# Patient Record
Sex: Female | Born: 1985 | Race: Black or African American | Hispanic: No | Marital: Single | Smoking: Former smoker
Health system: Southern US, Community
[De-identification: ages and names within clinical notes are randomized; demographics above are authoritative.]

## PROBLEM LIST (undated history)

## (undated) ENCOUNTER — Inpatient Hospital Stay (HOSPITAL_COMMUNITY): Payer: Self-pay

## (undated) DIAGNOSIS — Z3009 Encounter for other general counseling and advice on contraception: Secondary | ICD-10-CM

## (undated) DIAGNOSIS — R87629 Unspecified abnormal cytological findings in specimens from vagina: Secondary | ICD-10-CM

## (undated) DIAGNOSIS — I10 Essential (primary) hypertension: Secondary | ICD-10-CM

## (undated) DIAGNOSIS — K5090 Crohn's disease, unspecified, without complications: Secondary | ICD-10-CM

## (undated) DIAGNOSIS — Z975 Presence of (intrauterine) contraceptive device: Secondary | ICD-10-CM

## (undated) DIAGNOSIS — IMO0002 Reserved for concepts with insufficient information to code with codable children: Secondary | ICD-10-CM

## (undated) DIAGNOSIS — O09211 Supervision of pregnancy with history of pre-term labor, first trimester: Secondary | ICD-10-CM

## (undated) DIAGNOSIS — R87619 Unspecified abnormal cytological findings in specimens from cervix uteri: Secondary | ICD-10-CM

## (undated) HISTORY — DX: Encounter for other general counseling and advice on contraception: Z30.09

## (undated) HISTORY — DX: Supervision of pregnancy with history of pre-term labor, first trimester: O09.211

## (undated) HISTORY — DX: Unspecified abnormal cytological findings in specimens from vagina: R87.629

## (undated) HISTORY — PX: DILATION AND CURETTAGE OF UTERUS: SHX78

## (undated) HISTORY — DX: Essential (primary) hypertension: I10

## (undated) HISTORY — PX: SMALL INTESTINE SURGERY: SHX150

## (undated) HISTORY — DX: Presence of (intrauterine) contraceptive device: Z97.5

---
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
## 2007-01-09 ENCOUNTER — Ambulatory Visit: Payer: Self-pay | Admitting: Oncology

## 2007-01-20 ENCOUNTER — Ambulatory Visit (HOSPITAL_COMMUNITY): Admission: RE | Admit: 2007-01-20 | Discharge: 2007-01-20 | Payer: Self-pay | Admitting: Obstetrics & Gynecology

## 2007-01-30 ENCOUNTER — Inpatient Hospital Stay (HOSPITAL_COMMUNITY): Admission: AD | Admit: 2007-01-30 | Discharge: 2007-01-30 | Payer: Self-pay | Admitting: Obstetrics & Gynecology

## 2007-01-30 ENCOUNTER — Ambulatory Visit: Payer: Self-pay | Admitting: Family Medicine

## 2007-03-11 ENCOUNTER — Ambulatory Visit (HOSPITAL_COMMUNITY): Admission: RE | Admit: 2007-03-11 | Discharge: 2007-03-11 | Payer: Self-pay | Admitting: Obstetrics and Gynecology

## 2007-04-21 ENCOUNTER — Inpatient Hospital Stay (HOSPITAL_COMMUNITY): Admission: AD | Admit: 2007-04-21 | Discharge: 2007-04-23 | Payer: Self-pay | Admitting: Obstetrics & Gynecology

## 2007-04-21 ENCOUNTER — Ambulatory Visit: Payer: Self-pay | Admitting: Obstetrics & Gynecology

## 2007-04-24 ENCOUNTER — Inpatient Hospital Stay (HOSPITAL_COMMUNITY): Admission: AD | Admit: 2007-04-24 | Discharge: 2007-05-05 | Payer: Self-pay | Admitting: Gynecology

## 2007-04-24 ENCOUNTER — Ambulatory Visit: Payer: Self-pay | Admitting: Obstetrics and Gynecology

## 2007-04-27 ENCOUNTER — Encounter: Payer: Self-pay | Admitting: Obstetrics & Gynecology

## 2007-05-02 ENCOUNTER — Encounter: Payer: Self-pay | Admitting: General Surgery

## 2007-11-11 ENCOUNTER — Emergency Department (HOSPITAL_COMMUNITY): Admission: EM | Admit: 2007-11-11 | Discharge: 2007-11-11 | Payer: Self-pay | Admitting: Family Medicine

## 2008-05-19 ENCOUNTER — Emergency Department (HOSPITAL_COMMUNITY): Admission: EM | Admit: 2008-05-19 | Discharge: 2008-05-19 | Payer: Self-pay | Admitting: Emergency Medicine

## 2008-06-16 ENCOUNTER — Encounter: Admission: RE | Admit: 2008-06-16 | Discharge: 2008-06-16 | Payer: Self-pay | Admitting: Gastroenterology

## 2008-06-20 ENCOUNTER — Encounter (INDEPENDENT_AMBULATORY_CARE_PROVIDER_SITE_OTHER): Payer: Self-pay | Admitting: General Surgery

## 2008-06-20 ENCOUNTER — Inpatient Hospital Stay (HOSPITAL_COMMUNITY): Admission: RE | Admit: 2008-06-20 | Discharge: 2008-06-27 | Payer: Self-pay | Admitting: General Surgery

## 2008-09-21 ENCOUNTER — Other Ambulatory Visit: Admission: RE | Admit: 2008-09-21 | Discharge: 2008-09-21 | Payer: Self-pay | Admitting: Obstetrics and Gynecology

## 2010-03-19 ENCOUNTER — Inpatient Hospital Stay (HOSPITAL_COMMUNITY): Admission: AD | Admit: 2010-03-19 | Discharge: 2010-03-19 | Payer: Self-pay | Admitting: Obstetrics & Gynecology

## 2010-04-05 ENCOUNTER — Ambulatory Visit: Payer: Self-pay | Admitting: Physician Assistant

## 2010-04-05 ENCOUNTER — Inpatient Hospital Stay (HOSPITAL_COMMUNITY): Admission: AD | Admit: 2010-04-05 | Discharge: 2010-04-05 | Payer: Self-pay | Admitting: Obstetrics and Gynecology

## 2011-02-19 LAB — URINALYSIS, ROUTINE W REFLEX MICROSCOPIC
Bilirubin Urine: NEGATIVE
Glucose, UA: NEGATIVE mg/dL
Ketones, ur: NEGATIVE mg/dL
Nitrite: NEGATIVE
Nitrite: NEGATIVE
Protein, ur: NEGATIVE mg/dL
Protein, ur: NEGATIVE mg/dL
Specific Gravity, Urine: 1.01 (ref 1.005–1.030)
Urobilinogen, UA: 0.2 mg/dL (ref 0.0–1.0)

## 2011-02-19 LAB — WET PREP, GENITAL
Clue Cells Wet Prep HPF POC: NONE SEEN
Trich, Wet Prep: NONE SEEN
Yeast Wet Prep HPF POC: NONE SEEN

## 2011-02-19 LAB — URINE MICROSCOPIC-ADD ON

## 2011-02-19 LAB — URINE CULTURE

## 2011-04-16 NOTE — Consult Note (Signed)
NAME:  Lauren Ramirez, Lauren Ramirez NO.:  [id number]   MEDICAL RECORD NO.:  [id number]          PATIENT TYPE:  [place]   LOCATION:                                FACILITY:  [facility]   PHYSICIAN:  Adin Hector, MD     DATE OF BIRTH:  23-Sep-1986   DATE OF CONSULTATION:  DATE OF DISCHARGE:  04/23/2007                                 CONSULTATION   PRIMARY CARE PHYSICIAN:  None.   REASON FOR CONSULTATION:  Small bowel obstruction with a family history  of Crohn's.   HISTORY OF THE PRESENT ILLNESS:  The patient is a 25 year old female who  is otherwise pretty healthy with no prior surgeries, history of hernias  or any other health issues, or bowel problems.  She is three days status  post spontaneous vaginal delivery of an uncomplicated pregnancy and  delivery.  Postoperatively she developed abdominal distention, nausea  and crampy abdominal pain.  She has been obstipated with no flatus or  bowel movements.  They gave her enema and this seemed to open things up.  She claims she is tolerating some fluids, but she does admit that she is  getting crampy intermittent abdominal pain and obvious loud gurgling  noises and worsening distention.  Based on concerns Dr. Kalman Shan got  radiological films including KUB and CT scan, which were very concerning  for very dilated loops of small bowel.  The patient's mother and brother  were diagnosed to have Crohn's in her early 37s.  She was initially  treated with medicines, but actually had to have surgery.  The patient  is concerned and she is asked to be consulted based on radiological  recommendations for surgical evaluation given the severity of her bowel  obstruction.   She denies any sick contacts or travel history.  She normally has been  able to eat very easily with regular daily bowel movements, and no bouts  of constipation or diarrhea in the past.   PAST MEDICAL HISTORY:  The past medical history is otherwise pretty  negative.   PAST  SURGICAL HISTORY:  The past surgical history is negative.   PAST GYNECOLOGICAL HISTORY:  The patient is status post spontaneous  vaginal delivery; post delivery day number three and uncomplicated.   MEDICATIONS:  Medications at home include iron and prenatal vitamins,  and Flintstone's Chewables.   ALLERGIES:  None.   SOCIAL HISTORY:  The patient has occasionally has alcohol. She  occasionally has had cannabis in the past, but no tobacco or other drug  use.   FAMILY HISTORY:  The patient's mother and brother have Crohn's disease.  There is no other history of any bowel of GI or bowel problems.   REVIEW OF SYSTEMS:  The review of systems is as per the HPI otherwise  CONSTITUTIONAL, OPHTHALMOLOGIC, ENT, CARDIAC, RESPIRATORY, PULMONARY,  HEPATIC, RENAL, and ENDOCRINE are negative.  GASTROINTESTINAL:  As noted  above, otherwise prior to three days ago was pretty negative.  GYNECOLOGIC:  As noted above with spontaneous vaginal delivery of an  uncomplicated pregnancy.  MUSCULOSKELETAL, NEUROLOGICAL, DERMATOLOGIC,  HEMATOLOGIC, LYMPHATIC, and  ALLERGIC otherwise negative.   PHYSICAL EXAMINATION:  VITAL SIGNS:  The vital signs are reviewed in the  chart.  She is not toxic.  Afebrile and vital signs are stable with  pulse in the 70s, respirations 18, systolic blood pressure in the 110-  120s, and temperature of 98.5.  GENERAL APPEARANCE:  In general she has a well-developed, well-  nourished, thin female, not toxic or in any acute distress, although she  is occasional uncomfortable.  In fact, she is a little bit nervous, but  consolable.  No evidence of any dementia, delirium, psychosis or  paranoia.  She seems to have pretty good insight and at least average  intelligence.  NEUROLOGIC EXAMINATION:  Cranial nerves II-XII are intact.  Hand grip is  5/5, equal and symmetrical.  No resting or intention tremors.  HEENT:  Eyes; pupils are equal, round and react to light.  Extraocular   movements are intact.  Sclerae are nonicteric or injected.  NECK:  The neck is supple without any masses.  The trachea is midline.  HEART:  The heart has a regular rate and rhythm with no murmurs, gallops  or rubs.  CHEST:  The chest is clear to auscultation bilaterally.  No wheezes,  rales or rhonchi.  No pain on rib or sternal compression.  ABDOMEN:  The abdomen is obviously severe distended, but soft.  She does  have a little diastasis around her umbilicus, but I will not call it a  true hernia.  She has obvious borborygmi with loud bowel sounds that can  be easily heard and gurgling, and tinkling noises that are heard on  auscultation.  She does not have any peritonitis or worsening abdominal  pain.  LYMPHATICS:  No head, neck, axillary or groin lymphadenopathy.  GYNECOLOGICAL:  The patient has normal female genitalia.  She does have  some vaginal bleeding appropriate for having a vaginal delivery.  RECTAL:  The rectal is deferred per patient's request.  EXTREMITIES:  The extremities have no clubbing, cyanosis or edema.  No  obvious rashes, sores or ulcerations.  SKIN:  The skin reveals no petechiae, purpura, no other rashes, warts,  or lesions.   LABORATORY VALUES:  The patient's white count was 12 a couple days ago.  Electrolytes are rather unremarkable.  He urinalysis was negative was  last week.  X-ray of the abdomen showed very dilated loops of small  bowel with no colonic air.  CT scan of the abdomen shows the stomach and  proximal small bowel not massively dilated, but her mid small bowel is  extremely dilated with thickening loops of bowel near her terminal ileum  and a transitioning point at her terminal ileum.  Her colon has a little  bit of stool; it was relatively decompressed.  There is no free air or  free fluid.  Her uterus is rather enlarged, but appropriate for  postpartum.  ASSESSMENT AND PLAN:  This is a 25 year old female with a family history  of Crohn's  disease with evidence of bowel obstruction with transitions  near the terminal ileus with some thickening strongly concerning for  Crohn's flare given the fact that she has had no prior surgeries and no  evidence of any hernias.   The pathophysiology of Crohn's disease was discussed in detail.  Past  physiology of small bowel obstruction was discussed as well.  Options  were discussed.   Plan:  1. Recommendation was made for a nasogastric decompression to help  decompress her bowel and see if this will help with her crampy      abdominal pain and also help her bowel function return better.  2. I recommended intravenous fluids for to avoid dehydration.  3. I discussed with Dr. Kalman Shan and recommended that he get a GI      consultation to see if she warrants evaluation for Crohn's      including possible enteroscopy, serology and evaluation to see if      the would benefit for immunosuppression therapy since her mother &      brother have needed it in the past.  4. If the Crohn's workup is negative she might have another etiology      for her bowel obstruction  and this needs to be considered as well.  5. If she does not open she may require surgery.  I am concerned with      her very dilated bowel that it is going to be hard to do an      anastomosis on her and she probably will need an ileostomy with      Hartmann procedure, mucous fistula to help things regroup.  6. We will follow her expectantly.      Adin Hector, MD  Electronically Signed     SCG/MEDQ  D:  04/30/2007  T:  05/01/2007  Job:  [id number]

## 2011-04-16 NOTE — Op Note (Signed)
NAME:  Lauren Ramirez, Lauren Ramirez NO.:  [id number]   MEDICAL RECORD NO.:  [id number]          PATIENT TYPE:  INP   LOCATION:  0011                         FACILITY:  [facility]   PHYSICIAN:  Marland Kitchen T. 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   SURGICAL PROCEDURES:  Laparoscopic-assisted ileocecectomy with  anastomosis and repair of sigmoid colon x2.   SURGEON:  Dr. Excell Seltzer.   ASSISTANT:  Dr. Jeanella Anton.   ANESTHESIA:  General.   BRIEF HISTORY:  Lauren Ramirez is a 25 year old African American female  with approximately one-year history of Crohn's disease.  This was  diagnosis when she presented with a partial small-bowel obstruction and  small-bowel series showed stricturing of the terminal ileum.  She was  initially treated with steroids, 6MP and improved but over recent months  has had gradually increasing symptoms of abdominal bloating, cramping,  nausea and vomiting when attempting to take solid food and weight loss  of about 20 pounds.  Several days ago, a CT scan of the abdomen with  contrast was obtained which shows markedly distended proximal loops of  small bowel up to 7 cm in diameter with an area of tapering consistent  with Crohn's stricture in the terminal ileum.  I have recommended  proceeding with laparoscopic assisted and possible open ileocecectomy  up.  The nature of the procedure, its indications, risks of anesthetic  complications, bleeding, infection and anastomotic leak and possible  need for stoma were discussed with the patient and her family  preoperatively.  She is now brought operating room for this  procedure.  She has been on a clear liquid diet for at least 5 days but did not have  mechanical bowel prep due to the high-grade obstruction.   DESCRIPTION OF OPERATION:  The patient was brought to operating room,  placed in the supine position on the operating table and general  orotracheal anesthesia was induced.  She received preoperative broad-  spectrum antibiotics.  She was carefully positioned in semilithotomy  position with arms tucked.  Foley catheter was placed.  The abdomen was  widely sterilely prepped and draped.  PAS were placed.  Correct patient  and procedure were verified.  Initially, an 1-cm incision was made just  above the umbilicus and dissection carried down to midline fascia which  sharply incised for 1 cm and the peritoneum entered under direct vision.  Through a mattress suture of 0 Vicryl, the Hasson trocar was placed and  pneumoperitoneum established.  Laparoscopy showed multiple distended  loops of proximal small bowel that otherwise appeared normal.  The  patient was placed steep in reversed Trendelenburg and tilted over  toward the left which allowed exposure of the terminal ileum and the  cecum.  The right colon was visualized well.  The terminal ileum  was  markedly abnormal beginning right about the ileocecal valve where there  was creeping fat, inflammatory change and narrowing.  Approximately 10-  15 cm proximal to this was apparent tight stricture with chronic  inflammatory changes and scarring, and then the small bowel above this  was markedly dilated.  Also noted was the sigmoid colon was densely  adherent to the terminal ileum at the area of the tight stricture and  then again a little more distally on the ileum at a separate spot on the  sigmoid colon.  This was of concern for fistulization.  In order to  minimize the incision, initially the terminal ileum, cecum and right  colon were mobilized laparoscopically, dividing lateral peritoneal   attachments, and the cecum and terminal ileum were mobilized medially.  The right colon was mobilized up toward the hepatic flexure until I felt  we had plenty of bowel and could get this through a minimal  periumbilical incision.  I did not attempt to divide any mesentery  intracorporeally due to the adherent sigmoid colon and thickened  mesentery.  At this point, the trocars were removed, and the incision  was extended periumbilical about 6 cm in length.  The wound protractor  was placed.  We were then able to bring the cecum up through the  incision as well as the normal proximal right colon and the diseased  segment of terminal ileum as well.  This was all brought out onto the  anterior abdominal wall where we could carefully inspect it and deal  with the area of the adherent sigmoid.  The sigmoid colon which  otherwise appeared normal was densely adherent again in 2 areas along  the diseased terminal ileum.  These areas were carefully dissected away,  mobilizing the sigmoid off the terminal ileum, and indeed there were 2  less than 1-cm fistula tracts from the terminal ileum in 2 separate  areas to 2 separate areas of the sigmoid colon.  In each case, the edges  of the defect of the sigmoid were clearly identified, and on the more  distal area, this required some mobilization away from the mesentery.  Each site was then closed transversely with full-thickness interrupted 2-  0 silk sutures.  This appeared to be a good tight airtight closure,  certainly under no tension, and the bowel appeared intrinsically normal  at these areas.  Attention was then turned to the ileocecectomy.  The  small intestine proximal to the stricture which appeared noninflamed was  chosen as a proximal point of resection not removing any bowel that was  not obviously involved with Crohn's grossly.  This was divided with a  single firing of the GIA 75-mm stapler.  The mesentery of the involved  segment of  terminal ileum was then sequentially divided with the  harmonic scalpel or on thicker segments or larger vessels divided  between clamps and tied and suture ligated with 2-0 silk.  This  resection was carried distally down just beneath the cecum and then up  the proximal right colon where the bowel appeared completely normal, and  again this was divided with the GIA 75-mm stapler and the specimen  removed.  We stayed close to the bowel, and I did not specifically  identify the right ureter with the thickening and inflammatory response  of the mesentery because we kept well toward the bowel.  The small bowel  was very dilated with air proximally, and initially one small corner of  the  proximal end was opened and the pull sucker inserted and the small  bowel decompressed which was essentially all air.  This allowed much  easier exposure for the anastomosis.  An anastomosis was created between  the ileum and the proximal right colon with a single firing of the GIA  75-mm stapler.  Staple line inspected was intact without bleeding.  Common enterotomy was then closed in 2 layers with running 3-0 chromic  and then inverting 2-0 silk sutures.  Additional silk suture was placed  at the crotch of the anastomosis to reduce tension.  Anastomosis  appeared widely patent with good blood supply and under no tension.  Following this, the viscera returned to the abdomen and was irrigated  and hemostasis assured.  Gloves were changed, and the protractor was  removed.  The midline fascia was then closed with running #1 PDS begun  at either end of the incision and tied centrally.  The subcutaneous  tissue was irrigated and skin closed with staples.  Sponge, needle and  instrument counts correct.  The patient was then taken recovery in  satisfactory condition.      Darene Lamer. Hoxworth, M.D.  Electronically Signed     BTH/MEDQ  D:  06/20/2008  T:  06/20/2008  Job:  [id number]

## 2011-04-16 NOTE — Consult Note (Signed)
NAME:  Lauren Ramirez, Lauren Ramirez NO.:  [id number]   MEDICAL RECORD NO.:  [id number]          PATIENT TYPE:  WOC   LOCATION:  WOC                          FACILITY:  [facility]   PHYSICIAN:  John C. Amedeo Plenty, M.D.    DATE OF BIRTH:  09-25-1986   DATE OF CONSULTATION:  DATE OF DISCHARGE:                                 CONSULTATION   REASON FOR CONSULTATION:  Abdominal distension and suspected small bowel  obstruction.   HISTORY OF PRESENT ILLNESS:  The patient is a 25 year old black female  who is status post vaginal delivery of her first child at 32 weeks of 3  pounds 2 ounces birth weight who was noted to have abdominal distension  today with a soft, but very distended abdomen and decreased bowel  sounds.  KUB and CT scan were obtained which showed severely dilated  loops of small intestine up to 6-8 mm proximally with distal small bowel  thickening and inflamed mesentery thought consistent with Crohn's  disease and suggestive of a high grade small bowel obstruction.  The  patient had a pre-delivery care elsewhere and claims that she actually  lose 15 to 20 pounds during her pregnancy.  She had variable degrees of  intermittent abdominal pain, nausea, vomiting, abdominal distension and  loose stools.  She has a brother and a mother who had a history of  Crohn's disease and her brother reportedly had surgery for this three  weeks ago. She was afebrile tonight with a normal blood pressure but an  increased pulse of 115.   PAST MEDICAL HISTORY:  Essentially unremarkable.   MEDICATIONS:  None other than iron.   PAST SURGICAL HISTORY:  None.   SOCIAL HISTORY:  The patient denies alcohol, tobacco use.   PHYSICAL EXAMINATION:  GENERAL:  Thin, black female with admission in no  acute distress except from NG tube.  Weight 112.  HEENT:  Unremarkable.  HEART:  Regular tachycardia without murmurs, rubs, or gallops.  ABDOMEN:  Soft, symmetrically distended, with mild diffuse  tenderness  and decreased bowel sounds.  No hepatosplenomegaly, mass, or guarding.   LABORATORY:  CBC pending.  Potassium 3.7.  Other chemistries within  normal limits.   IMPRESSION:  Markedly dilated small intestine with abdominal distension,  overall picture most consistent with Crohn's disease with high grade  small bowel obstruction.   PLAN:  1. NG suction.  2. IV Solu-Medrol.  3. Repeat KUB in the morning.  4. CBC tonight and in the morning.  5. We will check SED rate.  6. If clinical and radiological picture do not improve with Solu-      Medrol, may need surgery.  Otherwise we will eventually need      elective workup with small bowel series, CT enterography or      colonoscopy.           ______________________________  Elyse Jarvis. Amedeo Plenty, M.D.     JCH/MEDQ  D:  04/30/2007  T:  05/01/2007  Job:  [id number]

## 2011-04-19 NOTE — Discharge Summary (Signed)
NAME:  Lauren Ramirez, Lauren Ramirez NO.:  [id number]   MEDICAL RECORD NO.:  [id number]          PATIENT TYPE:  INP   LOCATION:  [place]                         FACILITY:  [facility]   PHYSICIAN:  Marland Kitchen T. Hoxworth, M.D.DATE OF BIRTH:  26-Feb-1986   DATE OF ADMISSION:  06/20/2008  DATE OF DISCHARGE:  06/27/2008                               DISCHARGE SUMMARY   DISCHARGE DIAGNOSIS:  Crohn's disease of the terminal ileum with  obstruction and fistulization to the sigmoid colon.   SURGICAL PROCEDURES:  Ileocecectomy with anastomosis and repair of  sigmoid colon fistula x2, laparoscopic-assisted, on  June 20, 2008.   HISTORY OF PRESENT ILLNESS:  Lauren Ramirez is a 25 year old African  American female who has been followed by Leonie Douglas for approximately a  year with a diagnosis of Crohn's disease.  She presented a year ago with  an episode of pain and partial bowel obstruction that responded to  medical management.  GI series has shown a typical appearing Crohn's  stricture of the terminal ileum.  She, however, has developed  progressive symptoms of obstruction despite medical management and over  the last couple of months has had diffuse crampy abdominal pain after  meals, and then for the past 2-3 weeks, has had progressive symptoms  tolerating only liquids, would still cause nausea, pain and vomiting.  She had about a 15 pound weight loss.  GI series and KUB just prior to  admission showed significant distention of the small bowel and CT  enterography shows distended loops of proximal small bowel up to 7 cm in  diameter and a tapered stricture at the terminal ileum with mild  inflammatory change.  I have discussed with the patient and we have  elected to proceed with resection, and she is admitted for this  procedure.   PAST MEDICAL HISTORY:  No other medical or surgical problems.   MEDICATIONS:  None.   ALLERGIES:  None.   PERTINENT PHYSICAL EXAM:  5 feet 6 inches, 129  pounds.  VITAL SIGNS:  Within normal limits.  GENERAL:  She is a thin female in no acute distress.  ABDOMEN:  Is moderately distended without tenderness or masses.   HOSPITAL COURSE:  The patient was admitted on the morning of her  procedure.  She underwent a laparoscopic-assisted resection.  There was,  as expected, a long stricture related to Crohn's disease in the terminal  ileum, but also two areas of fistulization of the sigmoid colon.  She  underwent an ileocecectomy with anastomosis and repair of her sigmoid  colon.  Her postoperative course was smooth.  On the second day, she was  feeling better.  Abdomen was soft and she was started on clear liquid  diet, and white count was down to normal.  By the fourth postoperative  day her diet was advanced to full liquids.  Her abdomen remained benign  and she was ambulatory in the hall.  Pathology  revealed Crohn's disease with stricture and fibrosis without malignancy.  Her diet was advanced to a soft diet, which she tolerated and she was  felt ready for discharge  on July 27.  Her staples were removed and the  wound Steri-Stripped.  She is on Vicodin for pain.  Activity limitations  were discussed.  Followup is in my office in 2 weeks.      Darene Lamer. Hoxworth, M.D.  Electronically Signed     BTH/MEDQ  D:  08/01/2008  T:  08/01/2008  Job:  [id number]

## 2011-04-19 NOTE — Discharge Summary (Signed)
NAME:  Lauren Ramirez, GOTSCHALL NO.:  [id number]   MEDICAL RECORD NO.:  [id number]          PATIENT TYPE:  INP   LOCATION:  1308                         FACILITY:  [facility]   PHYSICIAN:  Adin Hector, MD     DATE OF BIRTH:  November 21, 1986   DATE OF ADMISSION:  05/01/2007  DATE OF DISCHARGE:  05/05/2007                               DISCHARGE SUMMARY   GASTROENTEROLOGIST:  Elyse Jarvis. Amedeo Plenty, M.D., [facility].   SURGEON:  Adin Hector, M.D., [facility] Surgery.   OBSTETRICS/GYN:  Willey Blade, M.D., who I think is acting as  primary care physician as well.   DISCHARGE DIAGNOSES:  1. Probable Crohn's disease.  2. Partial small bowel obstruction secondary to probable Crohn's      flare.  3. Early vaginal delivery, G1, P1, 32 weeks, by Dr. Burke Keels.   SUMMARY OF HOSPITAL COURSE:  Ms. Goodin is a 25 year old female who  came in through the emergency room with contractions at [redacted] weeks  gestation, G1, P0.  She was admitted to Dr. Rivka Safer for some  complaints of contractions and possible PTROM.  She was admitted in  labor and delivery and then switched over to a floor bed.  Apparently,  fetal heart tones progressed to the second stage, and she had a  spontaneous vaginal delivery at 0549 on Apr 27, 2007.  The baby went to  the NICU.   After delivery, she started developing abdominal discomfort and bloating  while having no flatus or bowel movement.  Based on concerns, she had  studies done that was concerning for a high-grade small bowel  obstruction.  I was consulted on Apr 30, 2007 along with Dr. Teena Irani  with gastroenterology, given the strong family history of Crohn's  disease in a brother and a mother.  Transfer was requested to the  surgical service, and we accepted with gastroenterology following.  She  was transferred over to [facility].  She was bolused with IV  Solu-Medrol, had a nasogastric decompression.  She seemed to start  having some improvement and had less distention with bowel movement and  flatus three days later.  An NG tube was removed, and she started on  full liquids. Given those improvements, she was set up for discharge.  On the day of discharge, on May 05, 2007, she had a small bowel follow-  through which had a narrow segment of ileum, consistent with Crohn's  disease with a rather lengthy ileal stricture.  The feeling was that  because she was clinically improved, we wished to avoid surgery at this  time.  Based on these improvements, she was discharged with the  following instructions:  1. She is to follow up with [facility] Gastroenterology, particularly with      Dr. Teena Irani, for follow up on Crohn's disease and to continue      steroids as needed.  Perhaps she would be changed over to a      different type of immunosuppression regimen.  2. She will follow up with surgery p.r.n., depending on her ability to  improve with immunosuppression first.  3. She should follow up with Dr. Burke Keels for her post obstetrical      care.  4. She should be discharged on prednisone 20 mg b.i.d.  5. I believe she should also be discharged on prenatal vitamins as      well.      Adin Hector, MD  Electronically Signed     SCG/MEDQ  D:  06/08/2007  T:  06/09/2007  Job:  [id number]

## 2011-08-29 LAB — DIFFERENTIAL
Eosinophils Absolute: 0.1
Eosinophils Relative: 1
Lymphs Abs: 1.2

## 2011-08-29 LAB — CBC
MCHC: 33.1
RDW: 15.7 — ABNORMAL HIGH

## 2011-08-29 LAB — COMPREHENSIVE METABOLIC PANEL
ALT: 16
AST: 21
Alkaline Phosphatase: 145 — ABNORMAL HIGH
CO2: 20
Calcium: 9.1
Chloride: 108
GFR calc Af Amer: >60
GFR calc non Af Amer: >60
Potassium: 3.1 — ABNORMAL LOW
Sodium: 137

## 2011-08-29 LAB — LIPASE, BLOOD: Lipase: 14

## 2011-08-30 LAB — COMPREHENSIVE METABOLIC PANEL
ALT: 13
AST: 15
CO2: 23
Chloride: 104
GFR calc Af Amer: >60
GFR calc non Af Amer: >60
Sodium: 136
Total Bilirubin: 1.1

## 2011-08-30 LAB — CBC
MCHC: 33.2
MCV: 85.8
Platelets: 445 — ABNORMAL HIGH
RBC: 3.63 — ABNORMAL LOW
RBC: 4.01
RBC: 4.79
WBC: 11.5 — ABNORMAL HIGH
WBC: 9.7

## 2011-08-30 LAB — BASIC METABOLIC PANEL
CO2: 24
Calcium: 7.9 — ABNORMAL LOW
Creatinine, Ser: 0.69
GFR calc Af Amer: >60

## 2011-08-30 LAB — URINALYSIS, ROUTINE W REFLEX MICROSCOPIC
Glucose, UA: NEGATIVE
Ketones, ur: >80 — AB
Nitrite: NEGATIVE
pH: 6

## 2011-08-30 LAB — URINE MICROSCOPIC-ADD ON

## 2011-08-30 LAB — DIFFERENTIAL
Basophils Absolute: 0
Eosinophils Absolute: 0.1
Eosinophils Relative: 1

## 2011-09-30 ENCOUNTER — Ambulatory Visit (HOSPITAL_COMMUNITY): Payer: Self-pay

## 2011-12-01 ENCOUNTER — Inpatient Hospital Stay (HOSPITAL_COMMUNITY)
Admission: AD | Admit: 2011-12-01 | Discharge: 2011-12-02 | Disposition: A | Discharge status: Home / Self Care | Payer: Self-pay | Source: Ambulatory Visit | Attending: Obstetrics & Gynecology | Admitting: Obstetrics & Gynecology

## 2011-12-01 DIAGNOSIS — O30009 Twin pregnancy, unspecified number of placenta and unspecified number of amniotic sacs, unspecified trimester: Secondary | ICD-10-CM

## 2011-12-01 DIAGNOSIS — B9689 Other specified bacterial agents as the cause of diseases classified elsewhere: Secondary | ICD-10-CM | POA: Insufficient documentation

## 2011-12-01 DIAGNOSIS — N949 Unspecified condition associated with female genital organs and menstrual cycle: Secondary | ICD-10-CM | POA: Insufficient documentation

## 2011-12-01 DIAGNOSIS — A499 Bacterial infection, unspecified: Secondary | ICD-10-CM | POA: Insufficient documentation

## 2011-12-01 DIAGNOSIS — R109 Unspecified abdominal pain: Secondary | ICD-10-CM | POA: Insufficient documentation

## 2011-12-01 DIAGNOSIS — N760 Acute vaginitis: Secondary | ICD-10-CM | POA: Insufficient documentation

## 2011-12-01 HISTORY — DX: Crohn's disease, unspecified, without complications: K50.90

## 2011-12-02 ENCOUNTER — Encounter (HOSPITAL_COMMUNITY): Payer: Self-pay | Admitting: *Deleted

## 2011-12-02 LAB — WET PREP, GENITAL
Trich, Wet Prep: NONE SEEN
Yeast Wet Prep HPF POC: NONE SEEN

## 2011-12-02 MED ORDER — METRONIDAZOLE 500 MG PO TABS: 500.0000 mg | ORAL_TABLET | Freq: Two times a day (BID) | ORAL | Status: AC

## 2011-12-02 NOTE — Progress Notes (Signed)
Bedside ultrasound performed by [facility] CNM to verify both fetal heartrates. (twin pregnancy)

## 2011-12-02 NOTE — Progress Notes (Signed)
Pt states her pregnancy was confrimed at [facility]-no care started

## 2011-12-02 NOTE — ED Provider Notes (Signed)
History     Chief Complaint  Patient presents with  . Abdominal Pain   HPI  Pt here with report lower pelvic pain x 2-3 days.  Denies vaginal bleeding.  +vaginal discharge, +odor.  Denies UTI symptoms.  Pt reports of twin gestation confirmed at another facility.  Denies any problems.    Past Medical History  Diagnosis Date  . Crohn's disease     Past Surgical History  Procedure Date  . Small intestine surgery     Family History  Problem Relation Age of Onset  . Diabetes Paternal Grandmother     History  Substance Use Topics  . Smoking status: Current Some Day Smoker    Types: Cigarettes  . Smokeless tobacco: Not on file  . Alcohol Use: No    Allergies: No Known Allergies  Prescriptions prior to admission  Medication Sig Dispense Refill  . acetaminophen (TYLENOL) 325 MG tablet Take 650 mg by mouth every 6 (six) hours as needed. For headache or pain       . Aspirin-Acetaminophen-Caffeine (GOODY HEADACHE PO) Take 1 packet by mouth daily as needed. For headache         Review of Systems  Gastrointestinal: Positive for abdominal pain.  Genitourinary:       Vaginal discharge  All other systems reviewed and are negative.   Physical Exam   Blood pressure 132/73, pulse 96, temperature 98.2 F (36.8 C), temperature source Oral, resp. rate 20, height 5' 6"  (1.676 m), weight 99.338 kg (219 lb), last menstrual period 07/27/2011, SpO2 99.00%.  Physical Exam  Constitutional: She is oriented to person, place, and time. She appears well-developed and well-nourished. No distress.  HENT:  Head: Normocephalic.  Neck: Normal range of motion. Neck supple.  Cardiovascular: Normal rate, regular rhythm and normal heart sounds.   Respiratory: Effort normal and breath sounds normal.  GI: Soft. There is no tenderness.  Genitourinary: No bleeding around the vagina. Vaginal discharge (mucusy) found.       FHR via [place] Twin A +, Twin B+  Neurological: She is alert and oriented to  person, place, and time.  Skin: Skin is warm and dry.    MAU Course  Procedures  Results for orders placed during the hospital encounter of 12/01/11 (from the past 24 hour(s))  WET PREP, GENITAL     Status: Abnormal   Collection Time   12/02/11 12:50 AM      Component Value Range   Yeast, Wet Prep NONE SEEN  NONE SEEN    Trich, Wet Prep NONE SEEN  NONE SEEN    Clue Cells, Wet Prep FEW (*) NONE SEEN    WBC, Wet Prep HPF POC FEW (*) NONE SEEN      Assessment and Plan  Bacterial Vaginosis  Plan: DC to home RX Flagyl Begin prenatal care as soon as possible.  [facility] 12/02/2011, 12:41 AM

## 2011-12-04 LAB — GC/CHLAMYDIA PROBE AMP, GENITAL: GC Probe Amp, Genital: NEGATIVE

## 2011-12-26 ENCOUNTER — Other Ambulatory Visit: Payer: Self-pay | Admitting: Adult Health

## 2011-12-26 ENCOUNTER — Other Ambulatory Visit (HOSPITAL_COMMUNITY)
Admission: RE | Admit: 2011-12-26 | Discharge: 2011-12-26 | Disposition: A | Discharge status: Home / Self Care | Payer: Self-pay | Source: Ambulatory Visit | Attending: Obstetrics and Gynecology | Admitting: Obstetrics and Gynecology

## 2011-12-26 DIAGNOSIS — Z113 Encounter for screening for infections with a predominantly sexual mode of transmission: Secondary | ICD-10-CM | POA: Insufficient documentation

## 2011-12-26 DIAGNOSIS — Z01419 Encounter for gynecological examination (general) (routine) without abnormal findings: Secondary | ICD-10-CM | POA: Insufficient documentation

## 2011-12-26 LAB — OB RESULTS CONSOLE ANTIBODY SCREEN: Antibody Screen: NEGATIVE

## 2011-12-26 LAB — OB RESULTS CONSOLE GC/CHLAMYDIA: Chlamydia: POSITIVE

## 2011-12-26 LAB — OB RESULTS CONSOLE RPR: RPR: NONREACTIVE

## 2011-12-26 LAB — OB RESULTS CONSOLE RUBELLA ANTIBODY, IGM: Rubella: IMMUNE

## 2012-01-20 ENCOUNTER — Inpatient Hospital Stay (HOSPITAL_COMMUNITY): Payer: Medicaid Other

## 2012-01-20 ENCOUNTER — Encounter (HOSPITAL_COMMUNITY): Payer: Self-pay | Admitting: *Deleted

## 2012-01-20 ENCOUNTER — Inpatient Hospital Stay (HOSPITAL_COMMUNITY)
Admission: AD | Admit: 2012-01-20 | Discharge: 2012-01-20 | Disposition: A | Discharge status: Home / Self Care | Payer: Medicaid Other | Source: Ambulatory Visit | Attending: Family Medicine | Admitting: Family Medicine

## 2012-01-20 DIAGNOSIS — O30009 Twin pregnancy, unspecified number of placenta and unspecified number of amniotic sacs, unspecified trimester: Secondary | ICD-10-CM | POA: Insufficient documentation

## 2012-01-20 DIAGNOSIS — O26899 Other specified pregnancy related conditions, unspecified trimester: Secondary | ICD-10-CM

## 2012-01-20 DIAGNOSIS — R109 Unspecified abdominal pain: Secondary | ICD-10-CM | POA: Insufficient documentation

## 2012-01-20 DIAGNOSIS — O4700 False labor before 37 completed weeks of gestation, unspecified trimester: Secondary | ICD-10-CM | POA: Insufficient documentation

## 2012-01-20 DIAGNOSIS — O30002 Twin pregnancy, unspecified number of placenta and unspecified number of amniotic sacs, second trimester: Secondary | ICD-10-CM

## 2012-01-20 LAB — URINALYSIS, ROUTINE W REFLEX MICROSCOPIC
Bilirubin Urine: NEGATIVE
Hgb urine dipstick: NEGATIVE
Ketones, ur: NEGATIVE mg/dL
Protein, ur: NEGATIVE mg/dL
Urobilinogen, UA: 0.2 mg/dL (ref 0.0–1.0)

## 2012-01-20 LAB — WET PREP, GENITAL

## 2012-01-20 NOTE — Discharge Instructions (Signed)
Abdominal Pain During Pregnancy Belly (abdominal) pain is common during pregnancy. Most of the time, it is not a serious problem. Other times, it can be a sign that something is wrong with the pregnancy. Always tell your doctor if you have belly pain. HOME CARE For mild pain:  Do not have sex (intercourse) or put anything in your vagina until you feel better.   Rest until your pain stops. If your pain lasts longer than 1 hour, call your doctor.   Drink clear fluids if you feel sick to your stomach (nauseous).   Do not eat solid food until you feel better.   Only take medicine as told by your doctor.   Keep all doctor visits as told.  GET HELP RIGHT AWAY IF:   You are bleeding, leaking fluid, or pieces of tissue come out of your vagina.   You have more pain or cramping.   You keep throwing up (vomiting).   You have pain when you pee (urinate) or have blood in your pee.   You have a fever.   You do not feel your baby moving as much.   You feel very weak or feel like passing out.   You have trouble breathing, with or without belly pain.   You have a very bad headache and belly pain.   You have fluid leaking from your vagina and belly pain.   You keep having watery poop (diarrhea).   Your belly pain does not go away after resting, or the pain gets worse.  MAKE SURE YOU:   Understand these instructions.   Will watch your condition.   Will get help right away if you are not doing well or get worse.  Document Released: 11/06/2009 Document Revised: 07/31/2011 Document Reviewed: 06/14/2011 [facility] Patient Information 2012 [place].

## 2012-01-20 NOTE — ED Provider Notes (Signed)
History    Chief Complaint  Patient presents with  . Abdominal Pain   HPI Patient is a 26 yo G3P0202 at 49w6dtwin gestation presenting with contractions. This morning, patient began having cramps/contractions. Called Dr. FGlo Herringwho recommended she come to MAU for evaluation. Has had contractions during this pregnancy. This morning, her contractions were every 3-5 minutes. Denies gush of fluid, denies bleeding. Feeling babies move, no decreased fetal movement. Contractions have improved somewhat since arrival to MAU. History of preterm labor. First pregnancy, delivered at 32 weeks. [place] received 17-p during that pregnancy.  [facility] Last visit on 01/13/12. At that time, no contractions and cervical length was 4.7cm. Had positive Chlamydia at previous check, and was treated two times during pregnancy. Last dose was on 01/13/12 but she has not had a test of cure. Dr. FGlo Herringhad planned on beginning 17-P, but this has not been started yet.  OB History    Grav Para Term Preterm Abortions TAB SAB Ect Mult Living   3 2 2  0 0 0 0 0 0 2     Past Medical History  Diagnosis Date  . Crohn's disease     Past Surgical History  Procedure Date  . Small intestine surgery     Family History  Problem Relation Age of Onset  . Diabetes Paternal Grandmother   . Anesthesia problems Neg Hx     History  Substance Use Topics  . Smoking status: Current Some Day Smoker    Types: Cigarettes  . Smokeless tobacco: Not on file  . Alcohol Use: No   Allergies: No Known Allergies  No prescriptions prior to admission  Does not take a prenatal vitamin  Review of Systems  Constitutional: Negative for fever.  HENT: Negative for congestion.   Respiratory: Negative for cough and shortness of breath.   Cardiovascular: Negative for chest pain.  Gastrointestinal: Positive for constipation. Negative for  heartburn, nausea, vomiting and diarrhea.  Genitourinary: Negative for dysuria.  Musculoskeletal: Negative for back pain.  Neurological: Negative for dizziness and headaches.  Psychiatric/Behavioral: Negative for depression.   Physical Exam   Blood pressure 130/73, pulse 109, temperature 97.8 F (36.6 C), temperature source Oral, resp. rate 20, height 5' 6"  (1.676 m), weight 99.338 kg (219 lb), last menstrual period 07/27/2011.  Physical Exam  Constitutional: She is oriented to person, place, and time. She appears well-developed and well-nourished. No distress.  HENT:  Head: Normocephalic and atraumatic.  Neck: Normal range of motion.  Cardiovascular: Normal rate and regular rhythm.   No murmur heard. Respiratory: Effort normal and breath sounds normal. She has no wheezes.  GI: Soft.       Gravid. Toco in place.  Genitourinary: Vagina normal.       Cervix closed, thick.  Musculoskeletal: Normal range of motion. She exhibits no edema and no tenderness.  Neurological: She is alert and oriented to person, place, and time. No cranial nerve deficit.  Skin: Skin is dry. No rash noted.    MAU Course  Procedures Results for orders placed during the hospital encounter of 01/20/12 (from the past 24 hour(s))  URINALYSIS, ROUTINE W REFLEX MICROSCOPIC     Status: Normal   Collection Time   01/20/12 12:05 PM      Component Value Range   Color, Urine YELLOW  YELLOW    APPearance CLEAR  CLEAR    Specific Gravity, Urine 1.010  1.005 - 1.030    pH 7.5  5.0 - 8.0    Glucose, UA NEGATIVE  NEGATIVE (mg/dL)   Hgb urine dipstick NEGATIVE  NEGATIVE    Bilirubin Urine NEGATIVE  NEGATIVE    Ketones, ur NEGATIVE  NEGATIVE (mg/dL)   Protein, ur NEGATIVE  NEGATIVE (mg/dL)   Urobilinogen, UA 0.2  0.0 - 1.0 (mg/dL)   Nitrite NEGATIVE  NEGATIVE    Leukocytes, UA NEGATIVE  NEGATIVE   WET PREP, GENITAL     Status: Abnormal   Collection Time   01/20/12  1:22 PM      Component Value Range   Yeast Wet  Prep HPF POC NONE SEEN  NONE SEEN    Trich, Wet Prep NONE SEEN  NONE SEEN    Clue Cells Wet Prep HPF POC NONE SEEN  NONE SEEN    WBC, Wet Prep HPF POC FEW (*) NONE SEEN    US Ob Transvaginal 01/20/2012  OBSTETRICAL ULTRASOUND: This exam was performed within a [facility]. Living twin IUP. Cervical length normal. No previa seen.   Assessment and Plan  26 yo G3P2 at 69w6dpresenting with contractions. No ROM, no cervical changes. - No active, regular contractions noted on monitor - Wet prep completed. Shows WBC, no yeast, trich or clue cells. - Repeat chlamydia pending. [facility]. Can be followed up by Dr. FGlo Herring- Ultrasound shows cervical length of 4.0cm which is within normal limits. No previa seen on ultrasound. - Observed in the MAU on monitor. Not in active labor. Discharged home in stable medical condition with labor precautions. [facility]  Dashiell Franchino 01/20/2012, 1:05 PM

## 2012-01-20 NOTE — Progress Notes (Addendum)
Woke up this morning, feeling some stuff - down here (rubbing lower abd).  Called office, told to come here.  Twin gestation

## 2012-01-21 LAB — GC/CHLAMYDIA PROBE AMP, GENITAL
Chlamydia, DNA Probe: NEGATIVE
GC Probe Amp, Genital: NEGATIVE

## 2012-01-21 NOTE — ED Provider Notes (Signed)
Chart reviewed and agree with management and plan.

## 2012-02-13 ENCOUNTER — Encounter (HOSPITAL_COMMUNITY): Payer: Self-pay | Admitting: *Deleted

## 2012-02-13 ENCOUNTER — Inpatient Hospital Stay (HOSPITAL_COMMUNITY)
Admission: AD | Admit: 2012-02-13 | Discharge: 2012-02-13 | Disposition: A | Discharge status: Home / Self Care | Payer: Medicaid Other | Source: Ambulatory Visit | Attending: Obstetrics & Gynecology | Admitting: Obstetrics & Gynecology

## 2012-02-13 DIAGNOSIS — O26899 Other specified pregnancy related conditions, unspecified trimester: Secondary | ICD-10-CM

## 2012-02-13 DIAGNOSIS — O30009 Twin pregnancy, unspecified number of placenta and unspecified number of amniotic sacs, unspecified trimester: Secondary | ICD-10-CM | POA: Insufficient documentation

## 2012-02-13 DIAGNOSIS — O99891 Other specified diseases and conditions complicating pregnancy: Secondary | ICD-10-CM | POA: Insufficient documentation

## 2012-02-13 DIAGNOSIS — N898 Other specified noninflammatory disorders of vagina: Secondary | ICD-10-CM

## 2012-02-13 DIAGNOSIS — O30049 Twin pregnancy, dichorionic/diamniotic, unspecified trimester: Secondary | ICD-10-CM | POA: Insufficient documentation

## 2012-02-13 LAB — FETAL FIBRONECTIN: Fetal Fibronectin: NEGATIVE

## 2012-02-13 LAB — AMNISURE RUPTURE OF MEMBRANE (ROM) NOT AT ARMC: Amnisure ROM: NEGATIVE

## 2012-02-13 NOTE — MAU Provider Note (Signed)
History     CSN: [id number]  Arrival date and time: 02/13/12 1637   None     No chief complaint on file.  HPI  Lauren Ramirez is a 26 y.o. [id number] who presents at 26 weeks 2 days with a diamniotic/dichorionic gestation for evaluation of preterm premature rupture of membranes from clinic. She is a patient of [facility]. She was seen in clinic, had a small gush of fluid when wiping, examined shortly thereafter, negative ferning, negative pooling and closed cervix. A FFN was collected and is pending. Lower back pain began this AM at 0230, which progressively radiated towards front and is a aching/cramping pain. Intermittent sharp pain, lasting <10 seconds. Some nausea, no vomiting or diarrhea. No contractions. No vaginal bleeding or hematuria.  History of two preterm deliveries, one at 32 weeks and one at 36 weeks. History of chlamydia during this pregnancy, last tested for cure one month ago here in the MAU. Last U/S in MAU 2/18, showing a cervical length of 4.0cm, no previa seen. EDD 05/19/2012 based on 7 week ultrasound.  Of note, patient also has a hx of Ileocecectomy with anastomosis and repair of sigmoid colon fistula x2, laparoscopic-assisted, on June 20, 2008 for Crohns Disease. No further flair ups and no medications.    OB History    Grav Para Term Preterm Abortions TAB SAB Ect Mult Living   3 2 0 2 0 0 0 0 0 2       Past Medical History  Diagnosis Date  . Crohn's disease     Past Surgical History  Procedure Date  . Small intestine surgery     Family History  Problem Relation Age of Onset  . Diabetes Paternal Grandmother   . Anesthesia problems Neg Hx     History  Substance Use Topics  . Smoking status: Current Some Day Smoker    Types: Cigarettes  . Smokeless tobacco: Not on file  . Alcohol Use: No    Allergies: No Known Allergies  No prescriptions prior to admission    Review of Systems  Constitutional: Negative for fever and diaphoresis.  Eyes:  Negative for blurred vision, double vision and photophobia.  Respiratory: Negative for shortness of breath.   Cardiovascular: Negative for chest pain and leg swelling.  Gastrointestinal: Positive for nausea and abdominal pain (suprapubic). Negative for vomiting and diarrhea.  Genitourinary: Negative for dysuria and hematuria.  Skin: Negative for rash.   Physical Exam   Last menstrual period 07/27/2011.  Physical Exam  Constitutional: She is oriented to person, place, and time. She appears well-developed and well-nourished. No distress.  HENT:  Head: Normocephalic and atraumatic.  Eyes: EOM are normal.  Neck: Normal range of motion.  Cardiovascular: Normal rate.   Respiratory: Effort normal.  GI: Soft. She exhibits distension (gravid). There is no tenderness. There is no rebound and no guarding.  Musculoskeletal: Normal range of motion. She exhibits no edema.  Neurological: She is alert and oriented to person, place, and time.  Skin: Skin is warm and dry. She is not diaphoretic. No erythema.  Psychiatric: She has a normal mood and affect. Her behavior is normal. Judgment and thought content normal.    MAU Course  Procedures  On Monitor: FHR: 150 Baby A baseline, 150 Baby B baseline,  No contractions  1800: Waiting for fetal fibronectin to return  - amniosure sent  Assessment and Plan  26 y.o. [id number] who presents at 26 weeks 2 days with a di/di gestation.  IUP  -  history of preterm labor/delivery  - negative fibronectin and amnisure    Rayburn Felt 02/13/2012, 5:00 PM Pt seen also by me. Discussed with Dr Harolyn Rutherford and will d/c home.

## 2012-02-13 NOTE — MAU Provider Note (Signed)
Attestation of Attending Supervision of Resident: Evaluation and management procedures were performed by the [facility] Medicine Resident under my supervision.  I have reviewed the resident's note and chart, and I agree with management and plan.  Verita Schneiders, M.D. 02/13/2012 8:45 PM

## 2012-02-13 NOTE — MAU Note (Signed)
Pt sent from MD office to R/O PTL, twin gestation, lower back pain since this a.m. Which radiated to the front.  Frequent urination.  Pt went to MD office, had ? Gush of clear fluid during exam, sent to MAU.  No bleeding.

## 2012-02-13 NOTE — Discharge Instructions (Signed)
Preterm Labor Preterm labor is when labor starts at less than 37 weeks of pregnancy. The normal length of a pregnancy is 39 to 41 weeks. CAUSES Often, there is no identifiable underlying cause as to why a woman goes into preterm labor. However, one of the most common known causes of preterm labor is infection. Infections of the uterus, cervix, vagina, amniotic sac, bladder, kidney, or even the lungs (pneumonia) can cause labor to start. Other causes of preterm labor include:  Urogenital infections, such as yeast infections and bacterial vaginosis.   Uterine abnormalities (uterine shape, uterine septum, fibroids, bleeding from the placenta).   A cervix that has been operated on and opens prematurely.   Malformations in the baby.   Multiple gestations (twins, triplets, and so on).   Breakage of the amniotic sac.  Additional risk factors for preterm labor include:  Previous history of preterm labor.   Premature rupture of membranes (PROM).   A placenta that covers the opening of the cervix (placenta previa).   A placenta that separates from the uterus (placenta abruption).   A cervix that is too weak to hold the baby in the uterus (incompetence cervix).   Having too much fluid in the amniotic sac (polyhydramnios).   Taking illegal drugs or smoking while pregnant.   Not gaining enough weight while pregnant.   Women younger than 39 and older than 24 years old.   Low socioeconomic status.   African-American ethnicity.  SYMPTOMS Signs and symptoms of preterm labor include:  Menstrual-like cramps.   Contractions that are 30 to 70 seconds apart, become very regular, closer together, and are more intense and painful.   Contractions that start on the top of the uterus and spread down to the lower abdomen and back.   A sense of increased pelvic pressure or back pain.   A watery or bloody discharge that comes from the vagina.  DIAGNOSIS  A diagnosis can be confirmed by:  A  vaginal exam.   An ultrasound of the cervix.   Sampling (swabbing) cervico-vaginal secretions. These samples can be tested for the presence of fetal fibronectin. This is a protein found in cervical discharge which is associated with preterm labor.   Fetal monitoring.  TREATMENT  Depending on the length of the pregnancy and other circumstances, a caregiver may suggest bed rest. If necessary, there are medicines that can be given to stop contractions and to quicken fetal lung maturity. If labor happens before 34 weeks of pregnancy, a prolonged hospital stay may be recommended. Treatment depends on the condition of both the mother and baby. PREVENTION There are some things a mother can do to lower the risk of preterm labor in future pregnancies. A woman can:   Stop smoking.   Maintain healthy weight gain and avoid chemicals and drugs that are not necessary.   Be watchful for any type of infection.   Inform her caregiver if she has a known history of preterm labor.  Document Released: 02/08/2004 Document Revised: 11/07/2011 Document Reviewed: 03/15/2011 [facility] Patient Information 2012 [place].

## 2012-02-22 ENCOUNTER — Encounter (HOSPITAL_COMMUNITY): Payer: Self-pay | Admitting: *Deleted

## 2012-02-22 ENCOUNTER — Inpatient Hospital Stay (HOSPITAL_COMMUNITY)
Admission: AD | Admit: 2012-02-22 | Discharge: 2012-02-22 | Disposition: A | Discharge status: Home / Self Care | Payer: Medicaid Other | Source: Ambulatory Visit | Attending: Obstetrics and Gynecology | Admitting: Obstetrics and Gynecology

## 2012-02-22 DIAGNOSIS — O30049 Twin pregnancy, dichorionic/diamniotic, unspecified trimester: Secondary | ICD-10-CM | POA: Insufficient documentation

## 2012-02-22 DIAGNOSIS — M545 Low back pain, unspecified: Secondary | ICD-10-CM | POA: Insufficient documentation

## 2012-02-22 DIAGNOSIS — O30009 Twin pregnancy, unspecified number of placenta and unspecified number of amniotic sacs, unspecified trimester: Secondary | ICD-10-CM | POA: Insufficient documentation

## 2012-02-22 DIAGNOSIS — O99891 Other specified diseases and conditions complicating pregnancy: Secondary | ICD-10-CM | POA: Insufficient documentation

## 2012-02-22 DIAGNOSIS — M461 Sacroiliitis, not elsewhere classified: Secondary | ICD-10-CM

## 2012-02-22 DIAGNOSIS — O4700 False labor before 37 completed weeks of gestation, unspecified trimester: Secondary | ICD-10-CM | POA: Insufficient documentation

## 2012-02-22 LAB — URINALYSIS, ROUTINE W REFLEX MICROSCOPIC
Hgb urine dipstick: NEGATIVE
Nitrite: NEGATIVE
Protein, ur: NEGATIVE mg/dL
Specific Gravity, Urine: 1.015 (ref 1.005–1.030)
Urobilinogen, UA: 0.2 mg/dL (ref 0.0–1.0)

## 2012-02-22 MED ORDER — NIFEDIPINE 10 MG PO CAPS
10.0000 mg | ORAL_CAPSULE | Freq: Once | ORAL | Status: AC
  Administered 2012-02-22: 10 mg via ORAL
  Filled 2012-02-22: qty 1

## 2012-02-22 MED ORDER — NIFEDIPINE 10 MG PO CAPS
10.0000 mg | ORAL_CAPSULE | Freq: Three times a day (TID) | ORAL | Status: DC
  Administered 2012-02-22: 10 mg via ORAL
  Filled 2012-02-22: qty 1

## 2012-02-22 NOTE — Discharge Instructions (Signed)
Sacroiliac Joint Dysfunction The sacroiliac joint connects the lower part of the spine (the sacrum) with the bones of the pelvis. CAUSES  Sometimes, there is no obvious reason for sacroiliac joint dysfunction. Other times, it may occur   During pregnancy.   After injury, such as:   Car accidents.   Sport-related injuries.   Work-related injuries.   Due to one leg being shorter than the other.   Due to other conditions that affect the joints, such as:   Rheumatoid arthritis.   Gout.   Psoriasis.   Joint infection (septic arthritis).  SYMPTOMS  Symptoms may include:  Pain in the:   Lower back.   Buttocks.   Groin.   Thighs and legs.   Difficult sitting, standing, walking, lying, bending or lifting.  DIAGNOSIS  A number of tests may be used to help diagnose the cause of sacroiliac joint dysfunction, including:  Imaging tests to look for other causes of pain, including:   MRI.   CT scan.   Bone scan.   Diagnostic injection: During a special x-ray (called fluoroscopy), a needle is put into the sacroiliac joint. A numbing medicine is injected into the joint. If the pain is improved or stopped, the diagnosis of sacroiliac joint dysfunction is more likely.  TREATMENT  There are a number of types of treatment used for sacroiliac joint dysfunction, including:  Only take over-the-counter or prescription medicines for pain, discomfort, or fever as directed by your caregiver.   Medications to relax muscles.   Rest. Decreasing activity can help cut down on painful muscle spasms and allow the back to heal.   Application of heat or ice to the lower back may improve muscle spasms and soothe pain.   Brace. A special back brace, called a sacroiliac belt, can help support the joint while your back is healing.   Physical therapy can help teach comfortable positions and exercises to strengthen muscles that support the sacroiliac joint.   Cortisone injections. Injections  of steroid medicine into the joint can help decrease swelling and improve pain.   Hyaluronic acid injections. This chemical improves lubrication within the sacroiliac joint, thereby decreasing pain.   Radiofrequency ablation. A special needle is placed into the joint, where it burns away nerves that are carrying pain messages from the joint.   Surgery. Because pain occurs during movement of the joint, screws and plates may be installed in order to limit or prevent joint motion.  HOME CARE INSTRUCTIONS   Take all medications exactly as directed.   Follow instructions regarding both rest and physical activity, to avoid worsening the pain.   Do physical therapy exercises exactly as prescribed.  SEEK IMMEDIATE MEDICAL CARE IF:  You experience increasingly severe pain.   You develop new symptoms, such as numbness or tingling in your legs or feet.   You lose bladder or bowel control.  Document Released: 02/14/2009 Document Revised: 11/07/2011 Document Reviewed: 02/14/2009 [facility] Patient Information 2012 [place].

## 2012-02-22 NOTE — Progress Notes (Signed)
Hansel Feinstein CNM and Dr Helene Kelp in to see pt. Discussing poss d/c plan.

## 2012-02-22 NOTE — MAU Note (Signed)
Having lower back pain since 2230. Constant and dull. Had BM yest but states wasn't like normal. No diarrhea. Feels has to have BM but can't. Thinks may be gas causing discomfort. Hx Crohns

## 2012-02-22 NOTE — MAU Provider Note (Signed)
Agree with above note.  Lauren Ramirez 02/22/2012 7:31 AM

## 2012-02-22 NOTE — Progress Notes (Signed)
Contractions are tight but not painful.

## 2012-02-22 NOTE — MAU Provider Note (Signed)
  History     CSN: [id number]  Arrival date and time: 02/22/12 0241   None     Chief Complaint  Patient presents with  . Back Pain   HPI  Lauren Ramirez is a 26 y.o. [id number] who presents at 49w4dwith a diamniotic/dichorionic gestation with constant, dull lower back pain since 2230. No vaginal bleeding. Mild white discharge. + fetal movement. No gush of fluid. Last seen in clinic 5 days ago. Some pelvic pressure. Feeling mild contractions, but not painful, more like tightening. History of two preterm deliveries, one at 32 weeks and one at 36 weeks. Last U/S in MAU 2/18, showing a cervical length of 4.0cm, no previa seen. EDD 05/19/2012 based on 7 week ultrasound. Of note, patient also has a hx of Ileocecectomy with anastomosis and repair of sigmoid colon fistula x2, laparoscopic-assisted, on June 20, 2008 for Crohns Disease. No further flair ups and no medications. Last stool yesterday, harder than usual. No rectal bleeding. [facility]   Past Medical History  Diagnosis Date  . Crohn's disease     Past Surgical History  Procedure Date  . Small intestine surgery     Family History  Problem Relation Age of Onset  . Diabetes Paternal Grandmother   . Anesthesia problems Neg Hx     History  Substance Use Topics  . Smoking status: Former Smoker    Types: Cigarettes  . Smokeless tobacco: Not on file  . Alcohol Use: No    Allergies: No Known Allergies  No prescriptions prior to admission    Review of Systems  Constitutional: Negative for fever.  Eyes: Negative for blurred vision, double vision and photophobia.  Respiratory: Negative for shortness of breath.   Cardiovascular: Negative for chest pain.  Gastrointestinal: Positive for constipation. Negative for nausea, vomiting and diarrhea.  Genitourinary: Negative for hematuria.  Neurological: Negative for headaches.   Physical Exam   Blood pressure 118/69, pulse 102, temperature 97.3 F  (36.3 C), temperature source Oral, resp. rate 20, height 5' 6"  (1.676 m), weight 98.703 kg (217 lb 9.6 oz), last menstrual period 07/27/2011.  Physical Exam  Constitutional: She is oriented to person, place, and time. She appears well-developed and well-nourished. No distress.  HENT:  Head: Normocephalic and atraumatic.  Eyes: EOM are normal.  Neck: Normal range of motion.  Cardiovascular: Normal rate.   Respiratory: Effort normal. No respiratory distress.  GI: Soft. She exhibits distension (gravid). There is no tenderness.  Musculoskeletal: Normal range of motion.  Neurological: She is alert and oriented to person, place, and time.  Skin: Skin is warm and dry. She is not diaphoretic. No erythema.  Psychiatric: She has a normal mood and affect. Her behavior is normal. Judgment and thought content normal.   Dilation: Closed Effacement (%): Thick Exam by:: MHansel FeinsteinCNM   MAU Course  Procedures  Initially, mild contractions every 2-3 minutes, with irritability afterwards Procardia x1 given 0445: Still having some irritability on FHT. Feeling some mild cramping. Another dose of procardia given. 0533: FHT looks mildly improved, less irritability. Cervical exam unchanged.  Assessment and Plan  26y.o. [id number] presents at 255w4diamniotic/dichorionic gestation Lower back pain, likely sacroilitis No s/sx of labor Discharge home Advised on labor symptoms Advised patient to make sooner appt if symptoms worsen  HoRayburn Felt/23/2013, 3:14 AM   Seen and examined by me also. Agree.

## 2012-02-22 NOTE — Progress Notes (Signed)
Seen by me. Agree with previous note  Cervix closed and long.  Uterine irritability noted with reaasuring FHR x 2  Will give some procardia to stop the cramps. Probably SI joint pain.  But patient states does not hurt now at all.  FFn negative 1.5 weeks ago, so not repeated tonight.

## 2012-02-22 NOTE — MAU Note (Signed)
Pt up to Br. More water to pt

## 2012-02-22 NOTE — Progress Notes (Signed)
Written and verbal d/c instructions given and understanding voiced. 

## 2012-02-22 NOTE — MAU Note (Signed)
DR Helene Kelp updated as to pt's status 30 mins after 2nd dose of Procardia.

## 2012-03-16 ENCOUNTER — Inpatient Hospital Stay (HOSPITAL_COMMUNITY)
Admission: AD | Admit: 2012-03-16 | Discharge: 2012-03-16 | Disposition: A | Discharge status: Hospice | Payer: Medicaid Other | Source: Ambulatory Visit | Attending: Obstetrics & Gynecology | Admitting: Obstetrics & Gynecology

## 2012-03-16 ENCOUNTER — Encounter (HOSPITAL_COMMUNITY): Payer: Self-pay | Admitting: *Deleted

## 2012-03-16 DIAGNOSIS — M549 Dorsalgia, unspecified: Secondary | ICD-10-CM | POA: Insufficient documentation

## 2012-03-16 DIAGNOSIS — R109 Unspecified abdominal pain: Secondary | ICD-10-CM | POA: Insufficient documentation

## 2012-03-16 DIAGNOSIS — N949 Unspecified condition associated with female genital organs and menstrual cycle: Secondary | ICD-10-CM

## 2012-03-16 DIAGNOSIS — O479 False labor, unspecified: Secondary | ICD-10-CM

## 2012-03-16 DIAGNOSIS — O30009 Twin pregnancy, unspecified number of placenta and unspecified number of amniotic sacs, unspecified trimester: Secondary | ICD-10-CM | POA: Insufficient documentation

## 2012-03-16 DIAGNOSIS — O99891 Other specified diseases and conditions complicating pregnancy: Secondary | ICD-10-CM | POA: Insufficient documentation

## 2012-03-16 HISTORY — DX: Unspecified abnormal cytological findings in specimens from cervix uteri: R87.619

## 2012-03-16 HISTORY — DX: Reserved for concepts with insufficient information to code with codable children: IMO0002

## 2012-03-16 LAB — URINALYSIS, ROUTINE W REFLEX MICROSCOPIC
Glucose, UA: NEGATIVE mg/dL
Hgb urine dipstick: NEGATIVE
Ketones, ur: NEGATIVE mg/dL
Protein, ur: NEGATIVE mg/dL
Urobilinogen, UA: 0.2 mg/dL (ref 0.0–1.0)

## 2012-03-16 NOTE — Discharge Instructions (Signed)
Pelvic Rest Pelvic rest is sometimes recommended for women when:   The placenta is partially or completely covering the opening of the cervix (placenta previa).   There is bleeding between the uterine wall and the amniotic sac in the first trimester (subchorionic hemorrhage).   The cervix begins to open without labor starting (incompetent cervix, cervical insufficiency).   The labor is too early (preterm labor).  HOME CARE INSTRUCTIONS  Do not have sexual intercourse, stimulation, or an orgasm.   Do not use tampons, douche, or put anything in the vagina.   Do not lift anything over 10 pounds (4.5 kg).   Avoid strenuous activity or straining your pelvic muscles.  SEEK MEDICAL CARE IF:  You have any vaginal bleeding during pregnancy. Treat this as a potential emergency.   You have cramping pain felt low in the stomach (stronger than menstrual cramps).   You notice vaginal discharge (watery, mucus, or bloody).   You have a low, dull backache.   There are regular contractions or uterine tightening.  SEEK IMMEDIATE MEDICAL CARE IF: You have vaginal bleeding and have placenta previa.  Document Released: 03/15/2011 Document Revised: 11/07/2011 Document Reviewed: 03/15/2011 [facility] Patient Information 2012 [place].Braxton Hicks Contractions Pregnancy is commonly associated with contractions of the uterus throughout the pregnancy. Towards the end of pregnancy (32 to 34 weeks), these contractions [facility]mael Holter) can develop more often and may become more forceful. This is not true labor because these contractions do not result in opening (dilatation) and thinning of the cervix. They are sometimes difficult to tell apart from true labor because these contractions can be forceful and people have different pain tolerances. You should not feel embarrassed if you go to the hospital with false labor. Sometimes, the only way to tell if you are in true labor is for your caregiver to follow  the changes in the cervix. How to tell the difference between true and false labor:  False labor.   The contractions of false labor are usually shorter, irregular and not as hard as those of true labor.   They are often felt in the front of the lower abdomen and in the groin.   They may leave with walking around or changing positions while lying down.   They get weaker and are shorter lasting as time goes on.   These contractions are usually irregular.   They do not usually become progressively stronger, regular and closer together as with true labor.   True labor.   Contractions in true labor last 30 to 70 seconds, become very regular, usually become more intense, and increase in frequency.   They do not go away with walking.   The discomfort is usually felt in the top of the uterus and spreads to the lower abdomen and low back.   True labor can be determined by your caregiver with an exam. This will show that the cervix is dilating and getting thinner.  If there are no prenatal problems or other health problems associated with the pregnancy, it is completely safe to be sent home with false labor and await the onset of true labor. HOME CARE INSTRUCTIONS   Keep up with your usual exercises and instructions.   Take medications as directed.   Keep your regular prenatal appointment.   Eat and drink lightly if you think you are going into labor.   If BH contractions are making you uncomfortable:   Change your activity position from lying down or resting to walking/walking to  resting.   Sit and rest in a tub of warm water.   Drink 2 to 3 glasses of water. Dehydration may cause B-H contractions.   Do slow and deep breathing several times an hour.  SEEK IMMEDIATE MEDICAL CARE IF:   Your contractions continue to become stronger, more regular, and closer together.   You have a gushing, burst or leaking of fluid from the vagina.   An oral temperature above 102 F (38.9 C)  develops.   You have passage of blood-tinged mucus.   You develop vaginal bleeding.   You develop continuous belly (abdominal) pain.   You have low back pain that you never had before.   You feel the baby's head pushing down causing pelvic pressure.   The baby is not moving as much as it used to.  Document Released: 11/18/2005 Document Revised: 11/07/2011 Document Reviewed: 05/12/2009 [facility] Patient Information 2012 [place].Braxton Hicks Contractions Pregnancy is commonly associated with contractions of the uterus throughout the pregnancy. Towards the end of pregnancy (32 to 34 weeks), these contractions [facility] Ishmael Holter) can develop more often and may become more forceful. This is not true labor because these contractions do not result in opening (dilatation) and thinning of the cervix. They are sometimes difficult to tell apart from true labor because these contractions can be forceful and people have different pain tolerances. You should not feel embarrassed if you go to the hospital with false labor. Sometimes, the only way to tell if you are in true labor is for your caregiver to follow the changes in the cervix. How to tell the difference between true and false labor:  False labor.   The contractions of false labor are usually shorter, irregular and not as hard as those of true labor.   They are often felt in the front of the lower abdomen and in the groin.   They may leave with walking around or changing positions while lying down.   They get weaker and are shorter lasting as time goes on.   These contractions are usually irregular.   They do not usually become progressively stronger, regular and closer together as with true labor.   True labor.   Contractions in true labor last 30 to 70 seconds, become very regular, usually become more intense, and increase in frequency.   They do not go away with walking.   The discomfort is usually felt in the top of the uterus  and spreads to the lower abdomen and low back.   True labor can be determined by your caregiver with an exam. This will show that the cervix is dilating and getting thinner.  If there are no prenatal problems or other health problems associated with the pregnancy, it is completely safe to be sent home with false labor and await the onset of true labor. HOME CARE INSTRUCTIONS   Keep up with your usual exercises and instructions.   Take medications as directed.   Keep your regular prenatal appointment.   Eat and drink lightly if you think you are going into labor.   If BH contractions are making you uncomfortable:   Change your activity position from lying down or resting to walking/walking to resting.   Sit and rest in a tub of warm water.   Drink 2 to 3 glasses of water. Dehydration may cause B-H contractions.   Do slow and deep breathing several times an hour.  SEEK IMMEDIATE MEDICAL CARE IF:   Your contractions continue to become  stronger, more regular, and closer together.   You have a gushing, burst or leaking of fluid from the vagina.   An oral temperature above 102 F (38.9 C) develops.   You have passage of blood-tinged mucus.   You develop vaginal bleeding.   You develop continuous belly (abdominal) pain.   You have low back pain that you never had before.   You feel the baby's head pushing down causing pelvic pressure.   The baby is not moving as much as it used to.  Document Released: 11/18/2005 Document Revised: 11/07/2011 Document Reviewed: 05/12/2009 [facility] Patient Information 2012 [place].

## 2012-03-16 NOTE — MAU Provider Note (Signed)
Lauren Ramirez is a 26 y.o. female presenting for abd and back pain. History OB History    Grav Para Term Preterm Abortions TAB SAB Ect Mult Living   3 2 0 2 0 0 0 0 0 2      Past Medical History  Diagnosis Date  . Crohn's disease   . Abnormal Pap smear    Past Surgical History  Procedure Date  . Small intestine surgery    Family History: family history includes Diabetes in her paternal grandmother.  There is no history of Anesthesia problems. Social History:  reports that she quit smoking about 5 months ago. Her smoking use included Cigarettes. She has a .5 pack-year smoking history. She does not have any smokeless tobacco history on file. She reports that she does not drink alcohol or use illicit drugs.  Review of Systems  Constitutional: Negative for fever.  Cardiovascular: Negative for chest pain.  Gastrointestinal: Positive for abdominal pain. Negative for heartburn.  Genitourinary: Negative for dysuria, urgency, frequency, hematuria and flank pain.  Neurological: Negative for headaches.   No bleeding, no leaking. Good FM Dilation:  (ext os 1.5, int os clsoed) Effacement (%): Thick Exam by:: Mariel Kansky RN Blood pressure 133/75, pulse 94, temperature 98.4 F (36.9 C), temperature source Oral, resp. rate 20, height 5' 5.5" (1.664 m), weight 101.334 kg (223 lb 6.4 oz), last menstrual period 07/27/2011, SpO2 99.00%.  Gen: NAD Abd; 36 wk size, soft, NT Back: neg CVAT Maternal Exam:  Uterine Assessment: Contraction strength is mild.  Contraction frequency is rare.   Abdomen: Patient reports no abdominal tenderness. Cervix: Cervix evaluated by digital exam.     Fetal Exam Fetal Monitor Review: Mode: ultrasound.   Baseline rate: A: 150   B: 145.  Variability: moderate (6-25 bpm).   Pattern: no decelerations.    Fetal State Assessment: Category I - tracings are normal.     Physical Exam    :     Assessment/Plan: [id number] @ 30w 6d Twin gestation Preterm uterine  irritability due to overdistention of uterus  There are no discharge medications for this patient.    Follow-up Information    Follow up with [facility]. (May call and reschedule if feeling better in AM)          Kelton Bultman 03/16/2012, 9:30 PM

## 2012-03-16 NOTE — MAU Note (Signed)
Patient states she has been having low back pain for about 3 days. Has twins. Denies any bleeding or leaking and reports good fetal movement.

## 2012-03-16 NOTE — MAU Note (Signed)
Pt c/o mild - mod low back for the past three days.  Has not taken anything for pain.

## 2012-03-26 ENCOUNTER — Encounter (HOSPITAL_COMMUNITY): Payer: Self-pay | Admitting: *Deleted

## 2012-03-26 ENCOUNTER — Inpatient Hospital Stay (HOSPITAL_COMMUNITY)
Admission: AD | Admit: 2012-03-26 | Discharge: 2012-03-26 | Disposition: A | Discharge status: Home / Self Care | Payer: Medicaid Other | Source: Ambulatory Visit | Attending: Obstetrics & Gynecology | Admitting: Obstetrics & Gynecology

## 2012-03-26 DIAGNOSIS — O479 False labor, unspecified: Secondary | ICD-10-CM

## 2012-03-26 DIAGNOSIS — A499 Bacterial infection, unspecified: Secondary | ICD-10-CM | POA: Insufficient documentation

## 2012-03-26 DIAGNOSIS — B9689 Other specified bacterial agents as the cause of diseases classified elsewhere: Secondary | ICD-10-CM

## 2012-03-26 DIAGNOSIS — O2390 Unspecified genitourinary tract infection in pregnancy, unspecified trimester: Secondary | ICD-10-CM | POA: Insufficient documentation

## 2012-03-26 DIAGNOSIS — N760 Acute vaginitis: Secondary | ICD-10-CM | POA: Insufficient documentation

## 2012-03-26 DIAGNOSIS — O99891 Other specified diseases and conditions complicating pregnancy: Secondary | ICD-10-CM | POA: Insufficient documentation

## 2012-03-26 MED ORDER — METRONIDAZOLE 500 MG PO TABS: 500.0000 mg | ORAL_TABLET | Freq: Two times a day (BID) | ORAL | Status: AC

## 2012-03-26 NOTE — MAU Note (Signed)
NO FLUID ON PERINEUM

## 2012-03-26 NOTE — ED Provider Notes (Signed)
First Provider Initiated Contact with Patient 03/26/12 2220      Chief Complaint:  ? ROM  Lauren Ramirez is  26 y.o. [id number] at 73w2dpresents complaining of No chief complaint on file. .  She states none contractions are associated with none vaginal bleeding, active fetal movement. X2. She states that she voided, started brushing her teeth, gagged, and then "water came out".  Does not feel contractions, just occ "tightening"   Menstrual History: OB History    Grav Para Term Preterm Abortions TAB SAB Ect Mult Living   3 2 0 2 0 0 0 0 0 2       Patient's last menstrual period was 07/27/2011.     Past Medical History: Past Medical History  Diagnosis Date  . Crohn's disease   . Abnormal Pap smear     Past Surgical History: Past Surgical History  Procedure Date  . Small intestine surgery     Family History: Family History  Problem Relation Age of Onset  . Diabetes Paternal Grandmother   . Anesthesia problems Neg Hx     Social History: History  Substance Use Topics  . Smoking status: Former Smoker -- 0.2 packs/day for 2 years    Types: Cigarettes    Quit date: 09/16/2011  . Smokeless tobacco: Not on file  . Alcohol Use: No     prior to pregnancy only    Allergies: No Known Allergies  Meds:  No prescriptions prior to admission    Review of Systems - Please refer to the aforementioned patients' reports.     Physical Exam  Blood pressure 116/76, pulse 107, height 5' 6"  (1.676 m), weight 102.513 kg (226 lb), last menstrual period 07/27/2011. GENERAL: Well-developed, well-nourished female in no acute distress.  LUNGS: Clear to auscultation bilaterally.  HEART: Regular rate and rhythm. ABDOMEN: Soft, nontender, nondistended, gravid.  EXTREMITIES: Nontender, no edema, 2+ distal pulses. SSE:  Yellow discharge with amine odor; wet prep + clue cells, no WBC, no Trich CERVICAL EXAM: Dilatation inner os closed; outer os 1/5 cms (no change from exam 10 days  ago)  Effacement 0%   Station -3   Presentation:unsure FHT:  Baseline rate 140 bpm   Variability moderate  Accelerations present   Decelerations none X 2 Contractions not felt, irritability on EFM   Labs: amnisure negative Imaging Studies:  No results found.  Assessment: Lauren Werntzis  26y.o. [phone] 369w2dresents with BV, probably bladder leakage.  Plan: Treat BV and discharge home  CRESENZO-DISHMAN,Jesusita Jocelyn 4/25/201310:31 PM

## 2012-03-26 NOTE — Discharge Instructions (Signed)
Bacterial Vaginosis Bacterial vaginosis (BV) is a vaginal infection where the normal balance of bacteria in the vagina is disrupted. The normal balance is then replaced by an overgrowth of certain bacteria. There are several different kinds of bacteria that can cause BV. BV is the most common vaginal infection in women of childbearing age. CAUSES   The cause of BV is not fully understood. BV develops when there is an increase or imbalance of harmful bacteria.   Some activities or behaviors can upset the normal balance of bacteria in the vagina and put women at increased risk including:   Having a new sex partner or multiple sex partners.   Douching.   Using an intrauterine device (IUD) for contraception.   It is not clear what role sexual activity plays in the development of BV. However, women that have never had sexual intercourse are rarely infected with BV.  Women do not get BV from toilet seats, bedding, swimming pools or from touching objects around them.  SYMPTOMS   Grey vaginal discharge.   A fish-like odor with discharge, especially after sexual intercourse.   Itching or burning of the vagina and vulva.   Burning or pain with urination.   Some women have no signs or symptoms at all.  DIAGNOSIS  Your caregiver must examine the vagina for signs of BV. Your caregiver will perform lab tests and look at the sample of vaginal fluid through a microscope. They will look for bacteria and abnormal cells (clue cells), a pH test higher than 4.5, and a positive amine test all associated with BV.  RISKS AND COMPLICATIONS   Pelvic inflammatory disease (PID).   Infections following gynecology surgery.   Developing HIV.   Developing herpes virus.  TREATMENT  Sometimes BV will clear up without treatment. However, all women with symptoms of BV should be treated to avoid complications, especially if gynecology surgery is planned. Female partners generally do not need to be treated. However,  BV may spread between female sex partners so treatment is helpful in preventing a recurrence of BV.   BV may be treated with antibiotics. The antibiotics come in either pill or vaginal cream forms. Either can be used with nonpregnant or pregnant women, but the recommended dosages differ. These antibiotics are not harmful to the baby.   BV can recur after treatment. If this happens, a second round of antibiotics will often be prescribed.   Treatment is important for pregnant women. If not treated, BV can cause a premature delivery, especially for a pregnant woman who had a premature birth in the past. All pregnant women who have symptoms of BV should be checked and treated.   For chronic reoccurrence of BV, treatment with a type of prescribed gel vaginally twice a week is helpful.  HOME CARE INSTRUCTIONS   Finish all medication as directed by your caregiver.   Do not have sex until treatment is completed.   Tell your sexual partner that you have a vaginal infection. They should see their caregiver and be treated if they have problems, such as a mild rash or itching.   Practice safe sex. Use condoms. Only have 1 sex partner.  PREVENTION  Basic prevention steps can help reduce the risk of upsetting the natural balance of bacteria in the vagina and developing BV:  Do not have sexual intercourse (be abstinent).   Do not douche.   Use all of the medicine prescribed for treatment of BV, even if the signs and symptoms go away.  Tell your sex partner if you have BV. That way, they can be treated, if needed, to prevent reoccurrence.  SEEK MEDICAL CARE IF:   Your symptoms are not improving after 3 days of treatment.   You have increased discharge, pain, or fever.  MAKE SURE YOU:   Understand these instructions.   Will watch your condition.   Will get help right away if you are not doing well or get worse.  FOR MORE INFORMATION  Division of STD Prevention (DSTDP), Centers for Disease  Control and Prevention: [employer] [place] (ASHA): www.ashastd.org  Document Released: 11/18/2005 Document Revised: 11/07/2011 Document Reviewed: 05/11/2009 [facility] Patient Information 2012 [place].

## 2012-03-26 NOTE — MAU Note (Signed)
Po ice water

## 2012-03-26 NOTE — MAU Note (Signed)
PT SAYS  THIS AM  PT HAD TIGHTENING IN ABD AND Q TIME SHE STOOD - SHE HAD TO VOID.  ALL WENT AWAY AT 5PM.  TOOK SHOWER- COUGHED- FELT GUSH- UNSURE IF URINE - SAT ON TOILET- MORE FLUID CAME OUT-.  CALLED  OFFICE AT 230PM-  CALLED HER BACK AT 435PM- TOLD HER TO COME IN..    PT SAYS SHE HAS BEEN HERE Q2WEEKS FOR UC AND BACK PAIN.    CERVIX- WAS CLOSED.

## 2012-03-26 NOTE — MAU Note (Signed)
TO B-ROOM

## 2012-03-27 NOTE — ED Provider Notes (Signed)
Attestation of Attending Supervision of Advanced Practitioner: Evaluation and management procedures were performed by the [facility] Fellow/PA/CNM/NP under my supervision and collaboration. Chart reviewed, and agree with management and plan.  Verita Schneiders, M.D. 03/27/2012 2:52 AM

## 2012-04-11 ENCOUNTER — Observation Stay (HOSPITAL_COMMUNITY)
Admission: AD | Admit: 2012-04-11 | Discharge: 2012-04-12 | DRG: 781 | Disposition: A | Discharge status: Home / Self Care | Payer: Medicaid Other | Source: Ambulatory Visit | Attending: Obstetrics and Gynecology | Admitting: Obstetrics and Gynecology

## 2012-04-11 ENCOUNTER — Encounter (HOSPITAL_COMMUNITY): Payer: Self-pay

## 2012-04-11 DIAGNOSIS — O2390 Unspecified genitourinary tract infection in pregnancy, unspecified trimester: Principal | ICD-10-CM | POA: Diagnosis present

## 2012-04-11 DIAGNOSIS — O4700 False labor before 37 completed weeks of gestation, unspecified trimester: Secondary | ICD-10-CM | POA: Diagnosis present

## 2012-04-11 DIAGNOSIS — N390 Urinary tract infection, site not specified: Secondary | ICD-10-CM | POA: Diagnosis present

## 2012-04-11 DIAGNOSIS — O3090 Multiple gestation, unspecified, unspecified trimester: Secondary | ICD-10-CM | POA: Diagnosis present

## 2012-04-11 DIAGNOSIS — O479 False labor, unspecified: Secondary | ICD-10-CM

## 2012-04-11 DIAGNOSIS — O30049 Twin pregnancy, dichorionic/diamniotic, unspecified trimester: Secondary | ICD-10-CM

## 2012-04-11 DIAGNOSIS — O30009 Twin pregnancy, unspecified number of placenta and unspecified number of amniotic sacs, unspecified trimester: Secondary | ICD-10-CM

## 2012-04-11 LAB — POCT FERN TEST: Fern Test: NEGATIVE

## 2012-04-11 LAB — URINALYSIS, ROUTINE W REFLEX MICROSCOPIC
Bilirubin Urine: NEGATIVE
Ketones, ur: NEGATIVE mg/dL
Nitrite: NEGATIVE
Urobilinogen, UA: 0.2 mg/dL (ref 0.0–1.0)

## 2012-04-11 LAB — WET PREP, GENITAL: Yeast Wet Prep HPF POC: NONE SEEN

## 2012-04-11 MED ORDER — NIFEDIPINE 10 MG PO CAPS
10.0000 mg | ORAL_CAPSULE | Freq: Once | ORAL | Status: AC
  Administered 2012-04-11: 10 mg via ORAL
  Filled 2012-04-11: qty 1

## 2012-04-11 NOTE — MAU Provider Note (Signed)
History     CSN: [id number]  Arrival date and time: 04/11/12 2047   First Provider Initiated Contact with Patient 04/11/12 2127      Chief Complaint  Patient presents with  . Contractions   HPI  Pt is here with report of contractions that started earlier today.  Denies vaginal bleeding or recent intercourse.  Unsure if broke her bag or water.  ? Clear fluid earlier, reports it's more thick now.  Pregnant with twins.  Reports Baby A was breech on last exam.   Past Medical History  Diagnosis Date  . Crohn's disease   . Abnormal Pap smear     ASCUS 2011    Past Surgical History  Procedure Date  . Small intestine surgery     Family History  Problem Relation Age of Onset  . Diabetes Paternal Grandmother   . Anesthesia problems Neg Hx     History  Substance Use Topics  . Smoking status: Former Smoker -- 0.2 packs/day for 2 years    Types: Cigarettes    Quit date: 09/16/2011  . Smokeless tobacco: Not on file  . Alcohol Use: No     prior to pregnancy only    Allergies: No Known Allergies  No prescriptions prior to admission    Review of Systems  Gastrointestinal: Positive for abdominal pain.  Genitourinary:       +vaginal discharge  All other systems reviewed and are negative.   Physical Exam   Blood pressure 128/89, pulse 88, temperature 98 F (36.7 C), temperature source Oral, resp. rate 18, height 5' 6"  (1.676 m), weight 102.683 kg (226 lb 6 oz), last menstrual period 07/27/2011, unknown if currently breastfeeding.  Physical Exam  Constitutional: She is oriented to person, place, and time. She appears well-developed and well-nourished. She appears distressed (appears uncomfortable).  HENT:  Head: Normocephalic.  Neck: Normal range of motion. Neck supple.  Cardiovascular: Normal rate, regular rhythm and normal heart sounds.   Respiratory: Effort normal and breath sounds normal. No respiratory distress.  GI: Soft. There is no tenderness.  Genitourinary:  No bleeding around the vagina. Vaginal discharge (mucusy) found.       Cervix - closed  Musculoskeletal: Normal range of motion. She exhibits edema (trace pedal).  Neurological: She is alert and oriented to person, place, and time.  Skin: Skin is warm and dry.    MAU Course  Procedures  Results for orders placed during the hospital encounter of 04/11/12 (from the past 24 hour(s))  WET PREP, GENITAL     Status: Abnormal   Collection Time   04/11/12  9:30 PM      Component Value Range   Yeast Wet Prep HPF POC NONE SEEN  NONE SEEN    Trich, Wet Prep NONE SEEN  NONE SEEN    Clue Cells Wet Prep HPF POC MANY (*) NONE SEEN    WBC, Wet Prep HPF POC MANY (*) NONE SEEN   POCT FERN TEST     Status: Normal   Collection Time   04/11/12  9:41 PM      Component Value Range   Fern Test Negative    URINALYSIS, ROUTINE W REFLEX MICROSCOPIC     Status: Abnormal   Collection Time   04/11/12 11:10 PM      Component Value Range   Color, Urine YELLOW  YELLOW    APPearance CLEAR  CLEAR    Specific Gravity, Urine 1.010  1.005 - 1.030    pH 6.0  5.0 - 8.0    Glucose, UA NEGATIVE  NEGATIVE (mg/dL)   Hgb urine dipstick NEGATIVE  NEGATIVE    Bilirubin Urine NEGATIVE  NEGATIVE    Ketones, ur NEGATIVE  NEGATIVE (mg/dL)   Protein, ur NEGATIVE  NEGATIVE (mg/dL)   Urobilinogen, UA 0.2  0.0 - 1.0 (mg/dL)   Nitrite NEGATIVE  NEGATIVE    Leukocytes, UA LARGE (*) NEGATIVE   URINE MICROSCOPIC-ADD ON     Status: Abnormal   Collection Time   04/11/12 11:10 PM      Component Value Range   Squamous Epithelial / LPF MANY (*) RARE    WBC, UA 7-10  <3 (WBC/hpf)   Bacteria, UA RARE  RARE    Urine-Other MUCOUS PRESENT     Cervix recheck after 2 hrs > closed Consult with Dr. Elly Modena > PO Procardia  Ultrasound:  Breech x 2; normal AFI Assessment and Plan  Preterm Contractions  Plan: Admit to antenatal Procardia BID Continuous Toco Reassess in am or prn  [facility] 04/11/2012, 11:21 PM

## 2012-04-11 NOTE — MAU Note (Signed)
Patient is here with c/o possible leaking amniotic fluids at 1800pm and intermittent contractions. She reports good fetal movement.

## 2012-04-12 ENCOUNTER — Encounter (HOSPITAL_COMMUNITY): Payer: Self-pay | Admitting: *Deleted

## 2012-04-12 ENCOUNTER — Inpatient Hospital Stay (HOSPITAL_COMMUNITY): Payer: Medicaid Other

## 2012-04-12 DIAGNOSIS — O30049 Twin pregnancy, dichorionic/diamniotic, unspecified trimester: Secondary | ICD-10-CM

## 2012-04-12 DIAGNOSIS — O4700 False labor before 37 completed weeks of gestation, unspecified trimester: Secondary | ICD-10-CM

## 2012-04-12 MED ORDER — SODIUM CHLORIDE 0.9 % IV SOLN
INTRAVENOUS | Status: DC
  Administered 2012-04-12 (×2): via INTRAVENOUS

## 2012-04-12 MED ORDER — METRONIDAZOLE 500 MG PO TABS
500.0000 mg | ORAL_TABLET | Freq: Two times a day (BID) | ORAL | Status: DC
  Administered 2012-04-12 (×2): 500 mg via ORAL
  Filled 2012-04-12 (×4): qty 1

## 2012-04-12 MED ORDER — ACETAMINOPHEN 325 MG PO TABS: 650.0000 mg | ORAL_TABLET | ORAL | Status: DC | PRN

## 2012-04-12 MED ORDER — CEPHALEXIN 500 MG PO CAPS: 500.0000 mg | ORAL_CAPSULE | Freq: Three times a day (TID) | ORAL | Status: AC

## 2012-04-12 MED ORDER — CALCIUM CARBONATE ANTACID 500 MG PO CHEW: 2.0000 | CHEWABLE_TABLET | ORAL | Status: DC | PRN

## 2012-04-12 MED ORDER — PRENATAL MULTIVITAMIN CH
1.0000 | ORAL_TABLET | Freq: Every day | ORAL | Status: DC
  Administered 2012-04-12: 1 via ORAL
  Filled 2012-04-12: qty 1

## 2012-04-12 MED ORDER — CEPHALEXIN 500 MG PO CAPS
500.0000 mg | ORAL_CAPSULE | Freq: Three times a day (TID) | ORAL | Status: DC
  Administered 2012-04-12 (×2): 500 mg via ORAL
  Filled 2012-04-12 (×5): qty 1

## 2012-04-12 MED ORDER — ZOLPIDEM TARTRATE 10 MG PO TABS: 10.0000 mg | ORAL_TABLET | Freq: Every evening | ORAL | Status: DC | PRN

## 2012-04-12 MED ORDER — NIFEDIPINE ER 30 MG PO TB24
30.0000 mg | ORAL_TABLET | Freq: Two times a day (BID) | ORAL | Status: DC
  Filled 2012-04-12: qty 1

## 2012-04-12 MED ORDER — DOCUSATE SODIUM 100 MG PO CAPS
100.0000 mg | ORAL_CAPSULE | Freq: Every day | ORAL | Status: DC
  Administered 2012-04-12: 100 mg via ORAL
  Filled 2012-04-12: qty 1

## 2012-04-12 MED ORDER — NIFEDIPINE 10 MG PO CAPS
10.0000 mg | ORAL_CAPSULE | Freq: Three times a day (TID) | ORAL | Status: DC
  Administered 2012-04-12: 10 mg via ORAL
  Filled 2012-04-12: qty 1

## 2012-04-12 NOTE — H&P (Signed)
Lauren Ramirez is a 26 y.o. female presenting for preterm contractions. Maternal Medical History:  Reason for admission: Reason for admission: contractions.  Nausea: contractions.  Contractions: Onset was 6-12 hours ago.   Frequency: regular.   Perceived severity is moderate.    Fetal activity: Perceived fetal activity is normal.      OB History    Grav Para Term Preterm Abortions TAB SAB Ect Mult Living   3 2 0 2 0 0 0 0 0 2      Past Medical History  Diagnosis Date  . Crohn's disease   . Abnormal Pap smear     ASCUS 2011   Past Surgical History  Procedure Date  . Small intestine surgery    Family History: family history includes Diabetes in her paternal grandmother.  There is no history of Anesthesia problems. Social History:  reports that she quit smoking about 6 months ago. Her smoking use included Cigarettes. She has a .5 pack-year smoking history. She does not have any smokeless tobacco history on file. She reports that she does not drink alcohol or use illicit drugs.  Review of Systems  Constitutional: Negative.  Negative for fever and chills.  Gastrointestinal: Positive for abdominal pain. Nausea: contractions.  Genitourinary: Negative.  Negative for dysuria, urgency and flank pain.  All other systems reviewed and are negative.    Dilation: Closed Effacement (%): Thick Exam by:: Cherrish Vitali cnm Blood pressure 128/89, pulse 88, temperature 98 F (36.7 C), temperature source Oral, resp. rate 18, height 5' 6"  (1.676 m), weight 102.683 kg (226 lb 6 oz), last menstrual period 07/27/2011, unknown if currently breastfeeding. Maternal Exam:  Introitus: Vagina is positive for vaginal discharge (mucusy).    Physical Exam  Constitutional: She is oriented to person, place, and time. She appears well-developed and well-nourished. She appears distressed (appears uncomfortable).  HENT:  Head: Normocephalic.  Neck: Normal range of motion. Neck supple.  Cardiovascular:  Normal rate, regular rhythm and normal heart sounds.   Respiratory: Effort normal and breath sounds normal.  GI: Soft. There is no tenderness.  Genitourinary: No bleeding around the vagina. Vaginal discharge (mucusy) found.       Cervix - closed  Musculoskeletal: Normal range of motion. She exhibits edema (trace pedal).  Neurological: She is alert and oriented to person, place, and time.  Skin: Skin is warm and dry.     Prenatal labs: ABO, Rh:   Antibody:   Rubella:   RPR:    HBsAg:    HIV:    GBS:     FHR A 140's, +accels, reactive B 140's, +accels, reactive  Toco 2-3  Assessment/Plan: Preterm Contractions UTI  Plan: Admit to Antenatal for observation Observation BID procardia RX Keflex Continuous toco  Urine culture  [facility] 04/12/2012, 1:51 AM

## 2012-04-12 NOTE — Progress Notes (Signed)
Patient ID: Lauren Ramirez, female   DOB: Jan 02, 1986, 26 y.o.   MRN: [id number] Lauren Ramirez) NOTE  Lauren Ramirez is a 26 y.o. [id number] at 30w5dwho is admitted for preterm contractions.   Fetal presentation is breech and breech. Length of Stay:  1  Days  Subjective: Patient reports persistent contractions but significantly decreased in intensity and frequency since admission Patient reports the fetal movement as active. Patient reports uterine contraction  activity as irregular, every 10 minutes. Patient reports  vaginal bleeding as none. Patient describes fluid per vagina as None.  Vitals:  Blood pressure 129/92, pulse 80, temperature 97.7 F (36.5 C), temperature source Oral, resp. rate 16, height 5' 6"  (1.676 m), weight 102.785 kg (226 lb 9.6 oz), last menstrual period 07/27/2011, unknown if currently breastfeeding. Physical Examination:  General appearance - alert, well appearing, and in no distress Fundal Height:  consistent with twins Cervical Exam: closed/thick/posterior. Extremities: extremities normal, atraumatic, no cyanosis or edema and no edema, redness or tenderness in the calves or thighs with DTRs 2+ bilaterally Membranes:intact  Toco: contractions q 154mutes  Labs:  Recent Results (from the past 24 hour(s))  WET PREP, GENITAL   Collection Time   04/11/12  9:30 PM      Component Value Range   Yeast Wet Prep HPF POC NONE SEEN  NONE SEEN    Trich, Wet Prep NONE SEEN  NONE SEEN    Clue Cells Wet Prep HPF POC MANY (*) NONE SEEN    WBC, Wet Prep HPF POC MANY (*) NONE SEEN   POCT FERN TEST   Collection Time   04/11/12  9:41 PM      Component Value Range   Fern Test Negative    URINALYSIS, ROUTINE W REFLEX MICROSCOPIC   Collection Time   04/11/12 11:10 PM      Component Value Range   Color, Urine YELLOW  YELLOW    APPearance CLEAR  CLEAR    Specific Gravity, Urine 1.010  1.005 - 1.030    pH 6.0  5.0 - 8.0    Glucose, UA  NEGATIVE  NEGATIVE (mg/dL)   Hgb urine dipstick NEGATIVE  NEGATIVE    Bilirubin Urine NEGATIVE  NEGATIVE    Ketones, ur NEGATIVE  NEGATIVE (mg/dL)   Protein, ur NEGATIVE  NEGATIVE (mg/dL)   Urobilinogen, UA 0.2  0.0 - 1.0 (mg/dL)   Nitrite NEGATIVE  NEGATIVE    Leukocytes, UA LARGE (*) NEGATIVE   URINE MICROSCOPIC-ADD ON   Collection Time   04/11/12 11:10 PM      Component Value Range   Squamous Epithelial / LPF MANY (*) RARE    WBC, UA 7-10  <3 (WBC/hpf)   Bacteria, UA RARE  RARE    Urine-Other MUCOUS PRESENT      Imaging Studies:      Medications:  Scheduled    . cephALEXin  500 mg Oral Q8H  . docusate sodium  100 mg Oral Daily  . metroNIDAZOLE  500 mg Oral Q12H  . NIFEdipine  10 mg Oral Once  . prenatal multivitamin  1 tablet Oral Daily  . DISCONTD: NIFEdipine  30 mg Oral BID  . DISCONTD: NIFEdipine  10 mg Oral Q8H   I have reviewed the patient's current medications.  ASSESSMENT: There is no problem list on file for this patient.   PLAN: [age]o [id number] with twin pregnancy admitted with preterm contractions - Fetal/maternal unit reassuring - Will continue to observe and consider discharge if  cervix remains unchanged later today.  Lauren Ramirez 04/12/2012,7:02 AM

## 2012-04-12 NOTE — Discharge Summary (Signed)
Physician Discharge Summary  Patient ID: Lauren Ramirez MRN: [id number] DOB/AGE: 1985/12/21 26 y.o.  Admit date: 04/11/2012 Discharge date: 04/12/2012  Admission Diagnoses: 46w5dTwin Gestation with preterm contractions  Discharge Diagnoses: 322w5dwin Gestation with preterm contractions without cervical change Probable UTI   Patient Active Problem List  Diagnoses  . Twin gestation, dichorionic diamniotic  . Preterm contractions   Discharged Condition: Improved  Hospital Course: Patient was admitted for observation to the antenatal service secondary to preterm contractions at 342w5dwins. EFM and continuous toco showed irregluar contractions q 2-10 mins for several hours. Contractions have been 10-30 mins for the last 4-5 hours with UI. Cervix remains closed/thick/post. Pt is being d/c home after 12+ hours of observation with no cervical change.   Consults: None  Significant Diagnostic Studies: Clinical Data: Twin pregnancy. Estimated gestational age by  clinical dates is 34 weeks 5 days. Uncertain fetal presentation  and to assess amniotic fluid volume.  LIMITED OBSTETRIC ULTRASOUND  Number of Fetuses:2  TWIN A:  Heart Rate: 150bpm  Movement: Fetal movement and cardiac activity observed.  Presentation: Fetal lie is towards the left with breech  presentation.  Placental Location: Anterior  Previa: None  Amniotic Fluid (Subjective): Subjectively normal.  BPD: Not measuredcm N/Aw N/Ad  TWIN B:  Heart Rate: .160.bpm  Movement: . Fetal movement and cardiac activity are observed.  Presentation: . Fetal lies towards the right with breech  presentation.  Placental Location: . Anterior.  Previa: . None.  Amniotic Fluid (Subjective): . SMarland Kitchenbjectively normal.  BPD: . Not measured.cm . N/A.w N/A.d  MATERNAL FINDINGS:  Cervix: Not visualized/  Uterus/Adnexae: Not visualized  IMPRESSION:  Twin intrauterine gestation. Twin A is towards the left with  breech presentation.  Normal appearance of amniotic fluid volume.  Twin B is towards the right with breech presentation. Normal  appearance of amniotic fluid volume.   Treatments: IV hydration and antibiotics: po Keflex  Discharge Exam: Blood pressure 132/79, pulse 98, temperature 97.9 F (36.6 C), temperature source Axillary, resp. rate 20, height 5' 6"  (1.676 m), weight 226 lb 9.6 oz (102.785 kg), last menstrual period 07/27/2011, unknown if currently breastfeeding. General appearance: alert, cooperative and no distress Head: Normocephalic, without obvious abnormality, atraumatic Eyes: negative Pelvic: external genitalia normal and cervix: closed/thick/post Extremities: extremities normal, atraumatic, no cyanosis or edema and Homans sign is negative, no sign of DVT  Disposition: 01-Home or Self Care  Discharge Orders    Future Orders Please Complete By Expires   Discharge patient      Comments:   Home      Medication List  As of 04/12/2012  2:11 PM   TAKE these medications         cephALEXin 500 MG capsule   Commonly known as: KEFLEX   Take 1 capsule (500 mg total) by mouth every 8 (eight) hours.           Follow-up Information    Follow up with [facility] on 04/14/2012. (as scheduled)    Contact information:   [id number]       Signed: SHODoren Custard/11/2012, 2:11 PM

## 2012-04-12 NOTE — Discharge Instructions (Signed)
Preterm Labor Preterm labor is when labor starts at less than 37 weeks of pregnancy. The normal length of a pregnancy is 39 to 41 weeks. CAUSES Often, there is no identifiable underlying cause as to why a woman goes into preterm labor. However, one of the most common known causes of preterm labor is infection. Infections of the uterus, cervix, vagina, amniotic sac, bladder, kidney, or even the lungs (pneumonia) can cause labor to start. Other causes of preterm labor include:  Urogenital infections, such as yeast infections and bacterial vaginosis.   Uterine abnormalities (uterine shape, uterine septum, fibroids, bleeding from the placenta).   A cervix that has been operated on and opens prematurely.   Malformations in the baby.   Multiple gestations (twins, triplets, and so on).   Breakage of the amniotic sac.  Additional risk factors for preterm labor include:  Previous history of preterm labor.   Premature rupture of membranes (PROM).   A placenta that covers the opening of the cervix (placenta previa).   A placenta that separates from the uterus (placenta abruption).   A cervix that is too weak to hold the baby in the uterus (incompetence cervix).   Having too much fluid in the amniotic sac (polyhydramnios).   Taking illegal drugs or smoking while pregnant.   Not gaining enough weight while pregnant.   Women younger than 58 and older than 24 years old.   Low socioeconomic status.   African-American ethnicity.  SYMPTOMS Signs and symptoms of preterm labor include:  Menstrual-like cramps.   Contractions that are 30 to 70 seconds apart, become very regular, closer together, and are more intense and painful.   Contractions that start on the top of the uterus and spread down to the lower abdomen and back.   A sense of increased pelvic pressure or back pain.   A watery or bloody discharge that comes from the vagina.  DIAGNOSIS  A diagnosis can be confirmed by:  A  vaginal exam.   An ultrasound of the cervix.   Sampling (swabbing) cervico-vaginal secretions. These samples can be tested for the presence of fetal fibronectin. This is a protein found in cervical discharge which is associated with preterm labor.   Fetal monitoring.  TREATMENT  Depending on the length of the pregnancy and other circumstances, a caregiver may suggest bed rest. If necessary, there are medicines that can be given to stop contractions and to quicken fetal lung maturity. If labor happens before 34 weeks of pregnancy, a prolonged hospital stay may be recommended. Treatment depends on the condition of both the mother and baby. PREVENTION There are some things a mother can do to lower the risk of preterm labor in future pregnancies. A woman can:   Stop smoking.   Maintain healthy weight gain and avoid chemicals and drugs that are not necessary.   Be watchful for any type of infection.   Inform her caregiver if she has a known history of preterm labor.  Document Released: 02/08/2004 Document Revised: 11/07/2011 Document Reviewed: 03/15/2011 [facility] Patient Information 2012 [name].

## 2012-04-13 LAB — URINE CULTURE
Colony Count: 55000
Culture  Setup Time: 201305120237

## 2012-04-13 LAB — GC/CHLAMYDIA PROBE AMP, URINE
Chlamydia, Swab/Urine, PCR: NEGATIVE
GC Probe Amp, Urine: NEGATIVE

## 2012-04-13 NOTE — MAU Provider Note (Signed)
Agree with above note.  Sway Guttierrez 04/13/2012 12:19 PM

## 2012-04-13 NOTE — H&P (Signed)
Agree with above note.  Lauren Ramirez 04/13/2012 12:19 PM

## 2012-04-14 ENCOUNTER — Encounter (HOSPITAL_COMMUNITY): Payer: Self-pay | Admitting: Anesthesiology

## 2012-04-14 ENCOUNTER — Inpatient Hospital Stay (HOSPITAL_COMMUNITY)
Admission: AD | Admit: 2012-04-14 | Discharge: 2012-04-16 | DRG: 765 | Disposition: A | Discharge status: Home / Self Care | Payer: Medicaid Other | Source: Ambulatory Visit | Attending: Obstetrics & Gynecology | Admitting: Obstetrics & Gynecology

## 2012-04-14 ENCOUNTER — Inpatient Hospital Stay (HOSPITAL_COMMUNITY): Payer: Medicaid Other | Admitting: Anesthesiology

## 2012-04-14 ENCOUNTER — Encounter (HOSPITAL_COMMUNITY): Payer: Self-pay

## 2012-04-14 ENCOUNTER — Encounter (HOSPITAL_COMMUNITY): Payer: Self-pay | Admitting: *Deleted

## 2012-04-14 ENCOUNTER — Encounter (HOSPITAL_COMMUNITY)
Admission: AD | Disposition: A | Discharge status: Home / Self Care | Payer: Self-pay | Source: Ambulatory Visit | Attending: Obstetrics & Gynecology

## 2012-04-14 DIAGNOSIS — O30009 Twin pregnancy, unspecified number of placenta and unspecified number of amniotic sacs, unspecified trimester: Secondary | ICD-10-CM | POA: Diagnosis present

## 2012-04-14 DIAGNOSIS — O329XX0 Maternal care for malpresentation of fetus, unspecified, not applicable or unspecified: Secondary | ICD-10-CM | POA: Diagnosis present

## 2012-04-14 DIAGNOSIS — O42919 Preterm premature rupture of membranes, unspecified as to length of time between rupture and onset of labor, unspecified trimester: Secondary | ICD-10-CM | POA: Diagnosis present

## 2012-04-14 DIAGNOSIS — O4700 False labor before 37 completed weeks of gestation, unspecified trimester: Secondary | ICD-10-CM

## 2012-04-14 DIAGNOSIS — O4290 Premature rupture of membranes, unspecified as to length of time between rupture and onset of labor, unspecified weeks of gestation: Principal | ICD-10-CM | POA: Diagnosis present

## 2012-04-14 DIAGNOSIS — O479 False labor, unspecified: Secondary | ICD-10-CM

## 2012-04-14 DIAGNOSIS — O30049 Twin pregnancy, dichorionic/diamniotic, unspecified trimester: Secondary | ICD-10-CM

## 2012-04-14 DIAGNOSIS — O3090 Multiple gestation, unspecified, unspecified trimester: Secondary | ICD-10-CM | POA: Diagnosis present

## 2012-04-14 LAB — CBC
HCT: 32.3 % — ABNORMAL LOW (ref 36.0–46.0)
MCH: 25.4 pg — ABNORMAL LOW (ref 26.0–34.0)
MCV: 78 fL (ref 78.0–100.0)
Platelets: 193 10*3/uL (ref 150–400)
RBC: 4.14 MIL/uL (ref 3.87–5.11)
WBC: 5.5 10*3/uL (ref 4.0–10.5)

## 2012-04-14 LAB — COMPREHENSIVE METABOLIC PANEL
AST: 21 U/L (ref 0–37)
Albumin: 2.4 g/dL — ABNORMAL LOW (ref 3.5–5.2)
Alkaline Phosphatase: 164 U/L — ABNORMAL HIGH (ref 39–117)
BUN: <3 mg/dL — ABNORMAL LOW (ref 6–23)
Chloride: 108 mEq/L (ref 96–112)
Creatinine, Ser: 0.68 mg/dL (ref 0.50–1.10)
Potassium: 4.1 mEq/L (ref 3.5–5.1)
Total Bilirubin: 0.6 mg/dL (ref 0.3–1.2)
Total Protein: 6.1 g/dL (ref 6.0–8.3)

## 2012-04-14 LAB — POCT FERN TEST: Fern Test: POSITIVE

## 2012-04-14 SURGERY — Surgical Case
Anesthesia: Spinal | Site: Abdomen | Wound class: Clean Contaminated

## 2012-04-14 MED ORDER — MIDAZOLAM HCL 2 MG/2ML IJ SOLN: 0.5000 mg | Freq: Once | INTRAMUSCULAR | Status: DC | PRN

## 2012-04-14 MED ORDER — ACETAMINOPHEN 10 MG/ML IV SOLN
1000.0000 mg | Freq: Four times a day (QID) | INTRAVENOUS | Status: AC | PRN
  Filled 2012-04-14: qty 100

## 2012-04-14 MED ORDER — CEFAZOLIN SODIUM-DEXTROSE 2-3 GM-% IV SOLR
2.0000 g | INTRAVENOUS | Status: AC
  Administered 2012-04-14: 2 g via INTRAVENOUS
  Filled 2012-04-14: qty 50

## 2012-04-14 MED ORDER — CITRIC ACID-SODIUM CITRATE 334-500 MG/5ML PO SOLN
ORAL | Status: AC
  Filled 2012-04-14: qty 15

## 2012-04-14 MED ORDER — KETOROLAC TROMETHAMINE 30 MG/ML IJ SOLN
30.0000 mg | Freq: Four times a day (QID) | INTRAMUSCULAR | Status: AC | PRN
  Administered 2012-04-14 (×2): 30 mg via INTRAVENOUS
  Filled 2012-04-14: qty 1

## 2012-04-14 MED ORDER — FENTANYL CITRATE 0.05 MG/ML IJ SOLN
100.0000 ug | INTRAMUSCULAR | Status: DC | PRN
  Administered 2012-04-14: 100 ug via INTRAVENOUS
  Filled 2012-04-14: qty 2

## 2012-04-14 MED ORDER — FAMOTIDINE IN NACL 20-0.9 MG/50ML-% IV SOLN
20.0000 mg | Freq: Once | INTRAVENOUS | Status: AC
  Administered 2012-04-14: 20 mg via INTRAVENOUS

## 2012-04-14 MED ORDER — PRENATAL MULTIVITAMIN CH
1.0000 | ORAL_TABLET | Freq: Every day | ORAL | Status: DC
  Administered 2012-04-15 – 2012-04-16 (×2): 1 via ORAL
  Filled 2012-04-14 (×2): qty 1

## 2012-04-14 MED ORDER — BUPIVACAINE HCL (PF) 0.25 % IJ SOLN
INTRAMUSCULAR | Status: DC | PRN
  Administered 2012-04-14: 30 mL

## 2012-04-14 MED ORDER — SODIUM CHLORIDE 0.9 % IV SOLN
1.0000 ug/kg/h | INTRAVENOUS | Status: DC | PRN
  Filled 2012-04-14: qty 2.5

## 2012-04-14 MED ORDER — OXYCODONE-ACETAMINOPHEN 5-325 MG PO TABS
1.0000 | ORAL_TABLET | ORAL | Status: DC | PRN
  Administered 2012-04-15: 1 via ORAL
  Administered 2012-04-16: 2 via ORAL
  Filled 2012-04-14: qty 2
  Filled 2012-04-14 (×2): qty 1

## 2012-04-14 MED ORDER — CEPHALEXIN 500 MG PO CAPS
500.0000 mg | ORAL_CAPSULE | Freq: Three times a day (TID) | ORAL | Status: DC
  Administered 2012-04-14 – 2012-04-16 (×6): 500 mg via ORAL
  Filled 2012-04-14 (×10): qty 1

## 2012-04-14 MED ORDER — SENNOSIDES-DOCUSATE SODIUM 8.6-50 MG PO TABS
2.0000 | ORAL_TABLET | Freq: Every day | ORAL | Status: DC
  Administered 2012-04-14 – 2012-04-15 (×2): 2 via ORAL

## 2012-04-14 MED ORDER — ZOLPIDEM TARTRATE 5 MG PO TABS: 5.0000 mg | ORAL_TABLET | Freq: Every evening | ORAL | Status: DC | PRN

## 2012-04-14 MED ORDER — FAMOTIDINE IN NACL 20-0.9 MG/50ML-% IV SOLN
INTRAVENOUS | Status: AC
  Filled 2012-04-14: qty 50

## 2012-04-14 MED ORDER — EPHEDRINE SULFATE 50 MG/ML IJ SOLN
INTRAMUSCULAR | Status: DC | PRN
  Administered 2012-04-14 (×2): 10 mg via INTRAVENOUS
  Administered 2012-04-14: 15 mg via INTRAVENOUS

## 2012-04-14 MED ORDER — LANOLIN HYDROUS EX OINT: 1.0000 "application " | TOPICAL_OINTMENT | CUTANEOUS | Status: DC | PRN

## 2012-04-14 MED ORDER — DIPHENHYDRAMINE HCL 25 MG PO CAPS
25.0000 mg | ORAL_CAPSULE | ORAL | Status: DC | PRN
  Administered 2012-04-15: 25 mg via ORAL
  Filled 2012-04-14: qty 1

## 2012-04-14 MED ORDER — LACTATED RINGERS IV SOLN
INTRAVENOUS | Status: DC
  Administered 2012-04-14 (×3): via INTRAVENOUS

## 2012-04-14 MED ORDER — IBUPROFEN 600 MG PO TABS
600.0000 mg | ORAL_TABLET | Freq: Four times a day (QID) | ORAL | Status: DC
  Administered 2012-04-15 – 2012-04-16 (×5): 600 mg via ORAL
  Filled 2012-04-14 (×6): qty 1

## 2012-04-14 MED ORDER — SCOPOLAMINE 1 MG/3DAYS TD PT72
1.0000 | MEDICATED_PATCH | Freq: Once | TRANSDERMAL | Status: DC
  Administered 2012-04-14: 1.5 mg via TRANSDERMAL

## 2012-04-14 MED ORDER — PROMETHAZINE HCL 25 MG/ML IJ SOLN
6.2500 mg | INTRAMUSCULAR | Status: DC | PRN
  Administered 2012-04-14: 12.5 mg via INTRAVENOUS

## 2012-04-14 MED ORDER — NALBUPHINE SYRINGE 5 MG/0.5 ML
5.0000 mg | INJECTION | INTRAMUSCULAR | Status: DC | PRN
  Filled 2012-04-14: qty 1

## 2012-04-14 MED ORDER — ONDANSETRON HCL 4 MG/2ML IJ SOLN
INTRAMUSCULAR | Status: DC | PRN
  Administered 2012-04-14: 4 mg via INTRAVENOUS

## 2012-04-14 MED ORDER — FENTANYL CITRATE 0.05 MG/ML IJ SOLN: 25.0000 ug | INTRAMUSCULAR | Status: DC | PRN

## 2012-04-14 MED ORDER — MEPERIDINE HCL 25 MG/ML IJ SOLN: 6.2500 mg | INTRAMUSCULAR | Status: DC | PRN

## 2012-04-14 MED ORDER — DIBUCAINE 1 % RE OINT: 1.0000 "application " | TOPICAL_OINTMENT | RECTAL | Status: DC | PRN

## 2012-04-14 MED ORDER — MAGNESIUM HYDROXIDE 400 MG/5ML PO SUSP: 30.0000 mL | ORAL | Status: DC | PRN

## 2012-04-14 MED ORDER — DIPHENHYDRAMINE HCL 50 MG/ML IJ SOLN: 25.0000 mg | INTRAMUSCULAR | Status: DC | PRN

## 2012-04-14 MED ORDER — SODIUM CHLORIDE 0.9 % IJ SOLN: 3.0000 mL | Freq: Two times a day (BID) | INTRAMUSCULAR | Status: DC

## 2012-04-14 MED ORDER — CITRIC ACID-SODIUM CITRATE 334-500 MG/5ML PO SOLN
30.0000 mL | Freq: Once | ORAL | Status: AC
  Administered 2012-04-14: 30 mL via ORAL

## 2012-04-14 MED ORDER — ONDANSETRON HCL 4 MG PO TABS: 4.0000 mg | ORAL_TABLET | ORAL | Status: DC | PRN

## 2012-04-14 MED ORDER — MORPHINE SULFATE (PF) 0.5 MG/ML IJ SOLN
INTRAMUSCULAR | Status: DC | PRN
  Administered 2012-04-14: .1 mg via INTRATHECAL

## 2012-04-14 MED ORDER — MENTHOL 3 MG MT LOZG: 1.0000 | LOZENGE | OROMUCOSAL | Status: DC | PRN

## 2012-04-14 MED ORDER — OXYTOCIN 20 UNITS IN LACTATED RINGERS INFUSION - SIMPLE
125.0000 mL/h | INTRAVENOUS | Status: AC
  Administered 2012-04-14: 125 mL/h via INTRAVENOUS
  Filled 2012-04-14: qty 1000

## 2012-04-14 MED ORDER — DIPHENHYDRAMINE HCL 25 MG PO CAPS: 25.0000 mg | ORAL_CAPSULE | Freq: Four times a day (QID) | ORAL | Status: DC | PRN

## 2012-04-14 MED ORDER — ACETAMINOPHEN 325 MG PO TABS: 325.0000 mg | ORAL_TABLET | ORAL | Status: DC | PRN

## 2012-04-14 MED ORDER — SODIUM CHLORIDE 0.9 % IJ SOLN: 3.0000 mL | INTRAMUSCULAR | Status: DC | PRN

## 2012-04-14 MED ORDER — FENTANYL CITRATE 0.05 MG/ML IJ SOLN
INTRAMUSCULAR | Status: DC | PRN
  Administered 2012-04-14: 25 ug via INTRATHECAL

## 2012-04-14 MED ORDER — ONDANSETRON HCL 4 MG/2ML IJ SOLN: 4.0000 mg | Freq: Three times a day (TID) | INTRAMUSCULAR | Status: DC | PRN

## 2012-04-14 MED ORDER — ONDANSETRON HCL 4 MG/2ML IJ SOLN: 4.0000 mg | INTRAMUSCULAR | Status: DC | PRN

## 2012-04-14 MED ORDER — NALOXONE HCL 0.4 MG/ML IJ SOLN: 0.4000 mg | INTRAMUSCULAR | Status: DC | PRN

## 2012-04-14 MED ORDER — KETOROLAC TROMETHAMINE 30 MG/ML IJ SOLN: 30.0000 mg | Freq: Four times a day (QID) | INTRAMUSCULAR | Status: AC | PRN

## 2012-04-14 MED ORDER — DIPHENHYDRAMINE HCL 50 MG/ML IJ SOLN: 12.5000 mg | INTRAMUSCULAR | Status: DC | PRN

## 2012-04-14 MED ORDER — WITCH HAZEL-GLYCERIN EX PADS: 1.0000 "application " | MEDICATED_PAD | CUTANEOUS | Status: DC | PRN

## 2012-04-14 MED ORDER — OXYTOCIN 20 UNITS IN LACTATED RINGERS INFUSION - SIMPLE
INTRAVENOUS | Status: DC | PRN
  Administered 2012-04-14: 40 [IU] via INTRAVENOUS

## 2012-04-14 MED ORDER — BUPIVACAINE IN DEXTROSE 0.75-8.25 % IT SOLN
INTRATHECAL | Status: DC | PRN
  Administered 2012-04-14: 12 mg via INTRATHECAL

## 2012-04-14 MED ORDER — SODIUM CHLORIDE 0.9 % IV SOLN: 250.0000 mL | INTRAVENOUS | Status: DC

## 2012-04-14 MED ORDER — TETANUS-DIPHTH-ACELL PERTUSSIS 5-2.5-18.5 LF-MCG/0.5 IM SUSP: 0.5000 mL | Freq: Once | INTRAMUSCULAR | Status: DC

## 2012-04-14 MED ORDER — SIMETHICONE 80 MG PO CHEW
80.0000 mg | CHEWABLE_TABLET | ORAL | Status: DC | PRN
  Administered 2012-04-15 (×3): 80 mg via ORAL

## 2012-04-14 MED ORDER — METOCLOPRAMIDE HCL 5 MG/ML IJ SOLN: 10.0000 mg | Freq: Three times a day (TID) | INTRAMUSCULAR | Status: DC | PRN

## 2012-04-14 SURGICAL SUPPLY — 31 items
CHLORAPREP W/TINT 26ML (MISCELLANEOUS) ×2 IMPLANT
CLOTH BEACON ORANGE TIMEOUT ST (SAFETY) ×2 IMPLANT
DRESSING TELFA 8X3 (GAUZE/BANDAGES/DRESSINGS) ×2 IMPLANT
ELECT REM PT RETURN 9FT ADLT (ELECTROSURGICAL) ×2
ELECTRODE REM PT RTRN 9FT ADLT (ELECTROSURGICAL) ×1 IMPLANT
GAUZE SPONGE 4X4 12PLY STRL LF (GAUZE/BANDAGES/DRESSINGS) ×2 IMPLANT
GLOVE BIO SURGEON STRL SZ7 (GLOVE) ×2 IMPLANT
GLOVE BIOGEL PI IND STRL 7.0 (GLOVE) ×2 IMPLANT
GLOVE BIOGEL PI INDICATOR 7.0 (GLOVE) ×2
GOWN PREVENTION PLUS LG XLONG (DISPOSABLE) ×6 IMPLANT
KIT ABG SYR 3ML LUER SLIP (SYRINGE) ×4 IMPLANT
NDL SAFETY ECLIPSE 18X1.5 (NEEDLE) ×1 IMPLANT
NEEDLE HYPO 18GX1.5 SHARP (NEEDLE) ×1
NEEDLE HYPO 22GX1.5 SAFETY (NEEDLE) ×2 IMPLANT
NEEDLE HYPO 25X5/8 SAFETYGLIDE (NEEDLE) ×2 IMPLANT
NS IRRIG 1000ML POUR BTL (IV SOLUTION) ×2 IMPLANT
PACK C SECTION WH (CUSTOM PROCEDURE TRAY) ×2 IMPLANT
PAD ABD 7.5X8 STRL (GAUZE/BANDAGES/DRESSINGS) ×2 IMPLANT
RTRCTR C-SECT PINK 25CM LRG (MISCELLANEOUS) ×2 IMPLANT
SLEEVE SCD COMPRESS KNEE MED (MISCELLANEOUS) ×2 IMPLANT
SUT MNCRL 0 VIOLET CTX 36 (SUTURE) ×2 IMPLANT
SUT MONOCRYL 0 CTX 36 (SUTURE) ×2
SUT VIC AB 0 CT1 36 (SUTURE) ×4 IMPLANT
SUT VIC AB 2-0 CT1 27 (SUTURE)
SUT VIC AB 2-0 CT1 TAPERPNT 27 (SUTURE) IMPLANT
SUT VIC AB 4-0 KS 27 (SUTURE) ×2 IMPLANT
SYR CONTROL 10ML LL (SYRINGE) ×4 IMPLANT
TAPE CLOTH SURG 4X10 WHT LF (GAUZE/BANDAGES/DRESSINGS) ×2 IMPLANT
TOWEL OR 17X24 6PK STRL BLUE (TOWEL DISPOSABLE) ×4 IMPLANT
TRAY FOLEY CATH 14FR (SET/KITS/TRAYS/PACK) ×2 IMPLANT
WATER STERILE IRR 1000ML POUR (IV SOLUTION) ×2 IMPLANT

## 2012-04-14 NOTE — Anesthesia Postprocedure Evaluation (Signed)
Anesthesia Post Note  Patient: Lauren Ramirez  Procedure(s) Performed: Procedure(s) (LRB): CESAREAN SECTION (N/A)  Anesthesia type: Spinal  Patient location: PACU  Post pain: Pain level controlled  Post assessment: Post-op Vital signs reviewed  Last Vitals:  Filed Vitals:   04/14/12 1430  BP:   Pulse:   Temp: 36.6 C  Resp: 19    Post vital signs: Reviewed  Level of consciousness: awake  Complications: No apparent anesthesia complications

## 2012-04-14 NOTE — H&P (Signed)
Obstetric History and Physical  Lauren Ramirez is a 26 y.o. [phone] with Di/Di twin IUP at 3w0dpresenting for PPROM that occurred at around 0900 with clear fluid.  Patient states she has been having regular, every 4-5 minutes contractions, no vaginal bleeding, ruptured, clear fluid membranes, with active fetal movement x 2.    Prenatal Course Source of Care: [facility]  with onset of care at 19 weeks Pregnancy complications or risks: Patient Active Problem List  Diagnoses  . Twin gestation, dichorionic diamniotic  . Preterm delivery  Chlamydia infection earlier in pregnancy, treated Patient has a history of Crohn's disease, s/p small intestine surgery in 2009.  No flare since surgery.  She plans to bottle feed She desires Nexplanon for postpartum contraception.   Prenatal labs and studies: ABO, Rh:  O positive Antibody:  negative Rubella:  immune RPR:   neg HBsAg:   neg HIV:   neg GBS: neg 2 hr Glucola  normal Genetic screening not done Anatomy UKoreanormal x 2  Past Medical History:  Past Medical History  Diagnosis Date  . Crohn's disease   . Abnormal Pap smear     ASCUS 2011   Past Surgical History:  Past Surgical History  Procedure Date  . Small intestine surgery    Obstetrical History:  OB History    Grav Para Term Preterm Abortions TAB SAB Ect Mult Living   3 2 0 2 0 0 0 0 0 2      Gynecological History:  Patient denies any cervical dysplasia or other STIs.  Social History:  History   Social History  . Marital Status: Single    Spouse Name: N/A    Number of Children: N/A  . Years of Education: N/A   Social History Main Topics  . Smoking status: Former Smoker -- 0.2 packs/day for 2 years    Types: Cigarettes    Quit date: 09/16/2011  . Smokeless tobacco: Never Used  . Alcohol Use: No     prior to pregnancy only  . Drug Use: No  . Sexually Active: Not Currently    Birth Control/ Protection: None   Other Topics Concern  . Not on file    Social History Narrative  . No narrative on file   Family History:  Family History  Problem Relation Age of Onset  . Diabetes Paternal Grandmother   . Anesthesia problems Neg Hx    Medications:  Prenatal vitamins,  Current Facility-Administered Medications  Medication Dose Route Frequency Provider Last Rate Last Dose  . ceFAZolin (ANCEF) IVPB 2 g/50 mL premix  2 g Intravenous On Call to OR Bo Teicher A Blayke Pinera, MD      . fentaNYL (SUBLIMAZE) injection 100 mcg  100 mcg Intravenous Q2H PRN KMyrtis Ser CNM      . lactated ringers infusion   Intravenous Continuous UOsborne Oman MD        Allergies: No Known Allergies  Review of Systems: Negative except for what is mentioned in HPI.  Physical Exam: Blood pressure 147/98, pulse 100, temperature 99.1 F (37.3 C), temperature source Oral, resp. rate 18, height 5' 6"  (1.676 m), weight 103.057 kg (227 lb 3.2 oz), last menstrual period 07/27/2011, SpO2 100.00%, unknown if currently breastfeeding. GENERAL: Well-developed, well-nourished female in no acute distress.  LUNGS: Clear to auscultation bilaterally.  HEART: Regular rate and rhythm. ABDOMEN: Soft, nontender, nondistended, gravid. EXTREMITIES: Nontender, no edema, 2+ distal pulses. Cervical Exam: Dilatation 2-3cm   Effacement 50%   Station -  2 at 95 FHT:  For both , the baseline rate 150 bpm   Variability moderate  Accelerations present   Decelerations none Contractions: Every 4 mins Bedside ultrasound: Breech/Breech presentation Pertinent Labs/Studies:   Results for orders placed during the hospital encounter of 04/14/12 (from the past 24 hour(s))  POCT FERN TEST     Status: Normal   Collection Time   04/14/12 11:29 AM      Component Value Range   Fern Test Positive      Assessment : Lauren Ramirez is a 26 y.o. [phone] with di/di twins at 46w0dbeing admitted for PPROM, early labor. Breech presentation of both twins.  Plan: Given PPROM at 35 weeks, need to  proceed with delivery.  Given presentation of twins, she needs a cesarean section.  The risks of cesarean section discussed with the patient included but were not limited to: bleeding which may require transfusion or reoperation; infection which may require antibiotics; injury to bowel, bladder, ureters or other surrounding organs; injury to the fetus; need for additional procedures including hysterectomy in the event of a life-threatening hemorrhage; placental abnormalities wth subsequent pregnancies, incisional problems, thromboembolic phenomenon and other postoperative/anesthesia complications. The patient concurred with the proposed plan, giving informed written consent for the procedure.   Patient has been NPO since 0900 she will remain NPO for procedure which is tentatively booked at 1500, may need to go earlier if there is evidence of progressing labor. Anesthesia and OR aware.  Preoperative prophylactic Ancef and SCDs ordered on call to the OR.  To OR when ready.  Patient also noted to have elevated BP, CBC and CMET ordered.  No symptoms of preeclampsia currently.  Will continue to monitor.     UVerita Schneiders M.D. 04/14/2012 12:12 PM

## 2012-04-14 NOTE — Transfer of Care (Signed)
Immediate Anesthesia Transfer of Care Note  Patient: Lauren Ramirez  Procedure(s) Performed: Procedure(s) (LRB): CESAREAN SECTION (N/A)  Patient Location: PACU  Anesthesia Type: Spinal  Level of Consciousness: awake  Airway & Oxygen Therapy: Patient Spontanous Breathing  Post-op Assessment: Report given to PACU RN and Post -op Vital signs reviewed and stable  Post vital signs: stable  Complications: No apparent anesthesia complications

## 2012-04-14 NOTE — Anesthesia Preprocedure Evaluation (Signed)
Anesthesia Evaluation  Patient identified by MRN, date of birth, ID band Patient awake    Reviewed: Allergy & Precautions, H&P , NPO status , Patient's Chart, lab work & pertinent test results  Airway Mallampati: III      Dental No notable dental hx.    Pulmonary neg pulmonary ROS,  breath sounds clear to auscultation  Pulmonary exam normal       Cardiovascular Exercise Tolerance: Good negative cardio ROS  Rhythm:regular Rate:Normal     Neuro/Psych negative neurological ROS  negative psych ROS   GI/Hepatic negative GI ROS, Neg liver ROS,   Endo/Other  negative endocrine ROS  Renal/GU negative Renal ROS  negative genitourinary   Musculoskeletal   Abdominal Normal abdominal exam  (+)   Peds  Hematology negative hematology ROS (+)   Anesthesia Other Findings Crohn's disease     Abnormal Pap smear   ASCUS 2011   Reproductive/Obstetrics (+) Pregnancy                           Anesthesia Physical Anesthesia Plan  ASA: III  Anesthesia Plan: Spinal   Post-op Pain Management:    Induction:   Airway Management Planned:   Additional Equipment:   Intra-op Plan:   Post-operative Plan:   Informed Consent: I have reviewed the patients History and Physical, chart, labs and discussed the procedure including the risks, benefits and alternatives for the proposed anesthesia with the patient or authorized representative who has indicated his/her understanding and acceptance.     Plan Discussed with: Anesthesiologist, CRNA and Surgeon  Anesthesia Plan Comments:         Anesthesia Quick Evaluation

## 2012-04-14 NOTE — Anesthesia Procedure Notes (Signed)
Spinal  Patient location during procedure: OR Start time: 04/14/2012 1:24 PM Staffing Anesthesiologist: Rudean Curt R Performed by: anesthesiologist  Preanesthetic Checklist Completed: patient identified, site marked, surgical consent, pre-op evaluation, timeout performed, IV checked, risks and benefits discussed and monitors and equipment checked Spinal Block Patient position: sitting Prep: DuraPrep Patient monitoring: heart rate, cardiac monitor, continuous pulse ox and blood pressure Approach: midline Location: L3-4 Injection technique: single-shot Needle Needle type: Sprotte  Needle gauge: 24 G Needle length: 9 cm Assessment Sensory level: T4 Additional Notes Patient identified.  Risk benefits discussed including failed block, incomplete pain control, headache, nerve damage, paralysis, blood pressure changes, nausea, vomiting, reactions to medication both toxic or allergic, and postpartum back pain.  Patient expressed understanding and wished to proceed.  All questions were answered.  Sterile technique used throughout procedure.  CSF was clear.  No parasthesia or other complications.  Please see nursing notes for vital signs.

## 2012-04-14 NOTE — MAU Note (Signed)
Patient is in with c/o rom starting at 09am. She states that it was clear fluid. She report good fetal movement.

## 2012-04-14 NOTE — MAU Note (Signed)
Patient state she started leaking clear fluid at 0930. Having contractions and continues to have leaking. States she is having twins and will have a Cesarean Section.

## 2012-04-14 NOTE — Op Note (Signed)
Lars Mage Marguarite Arbour Eifert PROCEDURE DATE: 04/14/2012  PREOPERATIVE DIAGNOSIS: Intrauterine pregnancy at 48w0dweeks gestation; dichorionic/diamniotic twin gestation; preterm premature rupture of membranes which progressed to active labor; breech presentation of both twins  POSTOPERATIVE DIAGNOSIS: The same  PROCEDURE: Primary Low Transverse Cesarean Section  SURGEON:  Dr. UVerita Schneiders ANESTHESIOLOGIST: Dr. FRudean Curt INDICATIONS: AAilah Barnais a 26y.o. [id number] dichorionic/diamniotic twin gestation at 367w0dith preterm premature rupture of membranes which progressed to active labor and breech presentation of both twins taken for cesarean section.  The risks of cesarean section were discussed with the patient including but were not limited to: bleeding which may require transfusion or reoperation; infection which may require antibiotics; injury to bowel, bladder, ureters or other surrounding organs; injury to the fetus; need for additional procedures including hysterectomy in the event of a life-threatening hemorrhage; placental abnormalities wth subsequent pregnancies, incisional problems, thromboembolic phenomenon and other postoperative/anesthesia complications.   The patient concurred with the proposed plan, giving informed written consent for the procedure.    FINDINGS:  Two viable female infants, both in footling breech presentation.  Apgars 8 and 9 for both, weight for Twin A was 5 pounds and 0.6 ounces; Twin B was 5 pounds and 13.3 ounces.  Clear amniotic fluid x 2.  Intact placenta x 2, three vessel cord x 2.  Normal uterus, fallopian tubes and ovaries bilaterally.  ANESTHESIA: Spinal INTRAVENOUS FLUIDS: 2500 ml ESTIMATED BLOOD LOSS: 900 ml URINE OUTPUT:  250 ml SPECIMENS: Placentas sent to pathology COMPLICATIONS: None immediate  PROCEDURE IN DETAIL:  The patient preoperatively received intravenous antibiotics and had sequential compression devices applied to her  lower extremities.  She was then taken to the operating room where spinal anesthesia was administered and was found to be adequate. She was then placed in a dorsal supine position with a leftward tilt, and prepped and draped in a sterile manner.  A foley catheter was placed into her bladder and attached to constant gravity.  After an adequate timeout was performed, a Pfannenstiel skin incision was made with scalpel and carried through to the underlying layer of fascia. The fascia was incised in the midline, and this incision was extended bilaterally using the Mayo scissors.  Kocher clamps were applied to the superior aspect of the fascial incision and the underlying rectus muscles were dissected off bluntly. A similar process was carried out on the inferior aspect of the facial incision. The rectus muscles were separated in the midline bluntly and the peritoneum was entered bluntly. Attention was turned to the lower uterine segment where a low transverse hysterotomy was made with a scalpel and extended bilaterally bluntly.  The infants were successfully delivered in breech presentations, the cords were clamped and cut and the infants were handed over to awaiting neonatology team. Uterine massage was then administered, and the placentas delivered intact with a three-vessel cord. The uterus was then cleared of clot and debris.  The hysterotomy was closed with 0 Monocryl in a running locked fashion, and an imbricating layer was also placed with a 0 Monocryl suture. There was increased bleeding on the left aspect of the hysterotomy but this was controlled with a figure-of-eight 0 Monocryl stitch.  The pelvis was cleared of all clot and debris. Hemostasis was confirmed on all surfaces.  The peritoneum and the muscles were reapproximated using 0 Vicryl interrupted stitches. The fascia was then closed using 0 Vicryl in a running fashion.  The subcutaneous layer was irrigated, and the skin  was closed with a 4-0 Vicryl  subcuticular stitch. The patient tolerated the procedure well. Sponge, lap, instrument and needle counts were correct x 2.  She was taken to the recovery room in stable condition.

## 2012-04-14 NOTE — Progress Notes (Signed)
On re-examination, fetal feet in vagina, cervix 6cm dilated. OR called.  Will move to cesarean delivery now.  Verita Schneiders, M.D. 04/14/2012 1:06 PM

## 2012-04-15 ENCOUNTER — Encounter (HOSPITAL_COMMUNITY): Payer: Self-pay | Admitting: Obstetrics & Gynecology

## 2012-04-15 DIAGNOSIS — O3090 Multiple gestation, unspecified, unspecified trimester: Secondary | ICD-10-CM

## 2012-04-15 DIAGNOSIS — O30009 Twin pregnancy, unspecified number of placenta and unspecified number of amniotic sacs, unspecified trimester: Secondary | ICD-10-CM

## 2012-04-15 DIAGNOSIS — O329XX0 Maternal care for malpresentation of fetus, unspecified, not applicable or unspecified: Secondary | ICD-10-CM

## 2012-04-15 DIAGNOSIS — O4290 Premature rupture of membranes, unspecified as to length of time between rupture and onset of labor, unspecified weeks of gestation: Secondary | ICD-10-CM

## 2012-04-15 LAB — CBC
MCH: 25.9 pg — ABNORMAL LOW (ref 26.0–34.0)
MCHC: 33 g/dL (ref 30.0–36.0)
Platelets: 170 10*3/uL (ref 150–400)

## 2012-04-15 MED ORDER — TETANUS-DIPHTH-ACELL PERTUSSIS 5-2.5-18.5 LF-MCG/0.5 IM SUSP
0.5000 mL | Freq: Once | INTRAMUSCULAR | Status: AC
  Administered 2012-04-16: 0.5 mL via INTRAMUSCULAR

## 2012-04-15 NOTE — Progress Notes (Signed)
UR chart review completed.  

## 2012-04-15 NOTE — Anesthesia Postprocedure Evaluation (Signed)
  Anesthesia Post-op Note  Patient: Lauren Ramirez  Procedure(s) Performed: Procedure(s) (LRB): CESAREAN SECTION (N/A)  Patient Location: PACU and Mother/Baby  Anesthesia Type: Spinal  Level of Consciousness: awake, alert  and oriented  Airway and Oxygen Therapy: Patient Spontanous Breathing  Post-op Pain: none  Post-op Assessment: Post-op Vital signs reviewed, Patient's Cardiovascular Status Stable, No headache, No backache, No residual numbness and No residual motor weakness  Post-op Vital Signs: Reviewed and stable  Complications: No apparent anesthesia complications

## 2012-04-15 NOTE — Addendum Note (Signed)
Addendum  created 04/15/12 0848 by Jonna Munro, CRNA   Modules edited:Notes Section

## 2012-04-15 NOTE — Progress Notes (Signed)
Subjective: Postpartum Day 1: Cesarean Delivery Patient reports incisional pain and no problems voiding.  pain 5/10 with walking. Controlled with motrin. Otherwise patient is doing well with no other complaints. Interested in rod implants for OCT.   Objective: Vital signs in last 24 hours: Temp:  [97.3 F (36.3 C)-99.1 F (37.3 C)] 98.2 F (36.8 C) (05/15 0400) Pulse Rate:  [61-111] 81  (05/15 0400) Resp:  [16-24] 20  (05/15 0400) BP: (120-151)/(63-98) 123/83 mmHg (05/15 0400) SpO2:  [99 %-100 %] 100 % (05/15 0400) Weight:  [103.057 kg (227 lb 3.2 oz)] 103.057 kg (227 lb 3.2 oz) (05/14 1050)  Physical Exam:  General: alert and cooperative Lochia: appropriate Uterine Fundus: Firm and below the level of the umbilicus. Incision: well dressed no visible drainage. DVT Evaluation: 1/4 pedal edema present bilaterally. No calf tenderness or erythema present.    Basename 04/15/12 0515 04/14/12 1150  HGB 7.6* 10.5*  HCT 23.0* 32.3*    Assessment/Plan: Status post Cesarean section. Doing well postoperatively.  Continue current care.  [place], Jared 04/15/2012, 7:48 AM  I have seen and examined this patient and I agree with the above. Bottlefeeding infants. Serita Grammes 8:32 AM 04/15/2012

## 2012-04-16 LAB — RPR: RPR Ser Ql: NONREACTIVE

## 2012-04-16 MED ORDER — INTEGRA F 125-1 MG PO CAPS: 1.0000 | ORAL_CAPSULE | Freq: Every day | ORAL | Status: DC

## 2012-04-16 MED ORDER — IBUPROFEN 600 MG PO TABS: 600.0000 mg | ORAL_TABLET | Freq: Four times a day (QID) | ORAL | Status: AC

## 2012-04-16 MED ORDER — ACETAMINOPHEN-CODEINE 300-30 MG PO TABS: 1.0000 | ORAL_TABLET | ORAL | Status: AC | PRN

## 2012-04-16 NOTE — Discharge Summary (Signed)
Obstetric Discharge Summary Reason for Admission: rupture of membranes Prenatal Procedures: NST and ultrasound Intrapartum Procedures: cesarean: low cervical, transverse Postpartum Procedures: none Complications-Operative and Postpartum: none Hemoglobin  Date Value Range Status  04/15/2012 7.6* 12.0-15.0 (g/dL) Final     DELTA CHECK NOTED     REPEATED TO VERIFY     HCT  Date Value Range Status  04/15/2012 23.0* 36.0-46.0 (%) Final    Physical Exam:  General: alert, cooperative and appears stated age CVS:  RRR, without murmur, gallops, or rubs Lungs:  CTA bilat ABD:  +BSx4, normal  Lochia: appropriate Uterine Fundus: firm Incision: healing well, no significant drainage, no dehiscence, no significant erythema DVT Evaluation: No evidence of DVT seen on physical exam. Negative Homan's sign.  Discharge Diagnoses: Preterm Premature Rupture of Membranes  Discharge Information: Date: 04/16/2012 Activity: pelvic rest Diet: routine Medications: Tylenol #3, Ibuprofen and Iron Condition: stable Instructions: refer to practice specific booklet Discharge to: home Follow-up Information    Follow up with [facility] in 6 weeks.   Contact information:   [place] [id number]          Newborn Data:   Luisa, Louk [[id number]]  Live born female  Birth Weight: 5 lb 0.6 oz (2285 g) APGAR: 8, 9   Aleighna, Wojtas [[id number]]  Live born female  Birth Weight: 5 lb 13.3 oz (2645 g) APGAR: 8, 9  Home with mother.  [facility] 04/16/2012, 9:17 AM

## 2012-04-16 NOTE — Discharge Instructions (Signed)

## 2012-04-16 NOTE — Progress Notes (Signed)
Subjective: Postpartum Day #2: Cesarean Delivery Patient reports mild incisional pain, tolerating PO, + flatus, + BM and no problems voiding.   Plans to use Depo Provera or Nexplanon as contraception.  Will discuss options with Dr. Glo Herring at f/u appointment. Denies dizziness, shortness of breath, or chest pain.  Objective: Vital signs in last 24 hours: Temp:  [97.2 F (36.2 C)-98 F (36.7 C)] 98 F (36.7 C) (05/16 0502) Pulse Rate:  [64-97] 64  (05/16 0502) Resp:  [18-20] 18  (05/16 0502) BP: (116-149)/(78-89) 127/85 mmHg (05/16 0502) SpO2:  [98 %] 98 % (05/15 0800)  Physical Exam:  General: alert, cooperative and no distress CVS:  RRR, without murmur, gallops, or rubs Lungs:  CTA bilat ABD:  +BSx4, normal  Lochia: appropriate Uterine Fundus: firm Incision: healing well, no significant drainage, no dehiscence, no significant erythema DVT Evaluation: No evidence of DVT seen on physical exam.  No calf tenderness. No significant calf/ankle edema.   Basename 04/15/12 0515 04/14/12 1150  HGB 7.6* 10.5*  HCT 23.0* 32.3*    Assessment/Plan: Status post Cesarean section. Doing well postoperatively.  Discharge to home  Paulette Blanch, PA-S 04/16/2012, 7:48 AM

## 2012-04-17 NOTE — Discharge Summary (Signed)
Agree with above note.  Lauren Ramirez 04/17/2012 9:09 AM

## 2012-04-20 NOTE — Progress Notes (Signed)
Post discharge review completed. 

## 2012-04-27 NOTE — Discharge Summary (Signed)
I have reviewed the documentation and agree.

## 2012-07-06 ENCOUNTER — Encounter (HOSPITAL_COMMUNITY): Payer: Self-pay | Admitting: *Deleted

## 2012-07-06 ENCOUNTER — Emergency Department (HOSPITAL_COMMUNITY)
Admission: EM | Admit: 2012-07-06 | Discharge: 2012-07-06 | Disposition: A | Discharge status: Home Health | Payer: Medicaid Other | Attending: Emergency Medicine | Admitting: Emergency Medicine

## 2012-07-06 DIAGNOSIS — J020 Streptococcal pharyngitis: Secondary | ICD-10-CM | POA: Insufficient documentation

## 2012-07-06 DIAGNOSIS — K5090 Crohn's disease, unspecified, without complications: Secondary | ICD-10-CM | POA: Insufficient documentation

## 2012-07-06 DIAGNOSIS — Z87891 Personal history of nicotine dependence: Secondary | ICD-10-CM | POA: Insufficient documentation

## 2012-07-06 MED ORDER — ACETAMINOPHEN 325 MG PO TABS
650.0000 mg | ORAL_TABLET | Freq: Once | ORAL | Status: AC
  Administered 2012-07-06: 650 mg via ORAL
  Filled 2012-07-06 (×2): qty 2

## 2012-07-06 MED ORDER — PENICILLIN G BENZATHINE 1200000 UNIT/2ML IM SUSP
1.2000 10*6.[IU] | Freq: Once | INTRAMUSCULAR | Status: AC
  Administered 2012-07-06: 1.2 10*6.[IU] via INTRAMUSCULAR
  Filled 2012-07-06: qty 2

## 2012-07-06 MED ORDER — IBUPROFEN 400 MG PO TABS
800.0000 mg | ORAL_TABLET | Freq: Once | ORAL | Status: AC
  Administered 2012-07-06: 800 mg via ORAL
  Filled 2012-07-06: qty 2

## 2012-07-06 MED ORDER — OXYCODONE-ACETAMINOPHEN 5-325 MG PO TABS: 2.0000 | ORAL_TABLET | ORAL | Status: AC | PRN

## 2012-07-06 NOTE — ED Notes (Signed)
Pt c/o sore throat and R ear pain since last night.

## 2012-07-06 NOTE — ED Provider Notes (Signed)
History     CSN: [id number]  Arrival date & time 07/06/12  1952   First MD Initiated Contact with Patient 07/06/12 2144      Chief Complaint  Patient presents with  . Sore Throat  . Otalgia    (Consider location/radiation/quality/duration/timing/severity/associated sxs/prior treatment) Patient is a 26 y.o. female presenting with pharyngitis. The history is provided by the patient.  Sore Throat Pertinent negatives include no headaches.   26 year old, female, with Crohn's disease, presents emergency department complaining of sore throat.  Since last night.  She has not had a fever, chills, or sweating.  She has not had nausea, vomiting, or cough.  She has no other complaints.  Past Medical History  Diagnosis Date  . Crohn's disease   . Abnormal Pap smear     ASCUS 2011    Past Surgical History  Procedure Date  . Small intestine surgery   . Cesarean section 04/14/2012    Procedure: CESAREAN SECTION;  Surgeon: Osborne Oman, MD;  Location: [facility];  Service: Gynecology;  Laterality: N/A;  primary    Family History  Problem Relation Age of Onset  . Diabetes Paternal Grandmother   . Anesthesia problems Neg Hx     History  Substance Use Topics  . Smoking status: Former Smoker -- 0.2 packs/day for 2 years    Types: Cigarettes    Quit date: 09/16/2011  . Smokeless tobacco: Never Used  . Alcohol Use: No     prior to pregnancy only    OB History    Grav Para Term Preterm Abortions TAB SAB Ect Mult Living   3 3 0 3 0 0 0 0 1 4       Review of Systems  Constitutional: Negative for fever and chills.  HENT: Positive for sore throat. Negative for voice change.   Gastrointestinal: Negative for nausea and vomiting.  Skin: Negative for rash.  Neurological: Negative for headaches.  Hematological: Positive for adenopathy.  All other systems reviewed and are negative.    Allergies  Review of patient's allergies indicates no known allergies.  Home Medications    Current Outpatient Rx  Name Route Sig Dispense Refill  . OXYCODONE-ACETAMINOPHEN 5-325 MG PO TABS Oral Take 2 tablets by mouth every 4 (four) hours as needed for pain. 6 tablet 0    BP 127/77  Pulse 96  Temp 99.6 F (37.6 C) (Oral)  Resp 19  SpO2 100%  Breastfeeding? Unknown  Physical Exam  Nursing note and vitals reviewed. Constitutional: She is oriented to person, place, and time. She appears well-developed and well-nourished. No distress.  HENT:  Head: Normocephalic and atraumatic.  Mouth/Throat: Oropharyngeal exudate present.  Eyes: Conjunctivae are normal.  Neck: Normal range of motion. Neck supple.  Pulmonary/Chest: She is in respiratory distress.  Abdominal: She exhibits no distension.  Musculoskeletal: Normal range of motion.  Lymphadenopathy:    She has cervical adenopathy.  Neurological: She is alert and oriented to person, place, and time.  Skin: Skin is warm and dry.  Psychiatric: She has a normal mood and affect. Thought content normal.    ED Course  Procedures (including critical care time)  Labs Reviewed  RAPID STREP SCREEN - Abnormal; Notable for the following:    Streptococcus, Group A Screen (Direct) POSITIVE (*)     All other components within normal limits   No results found.   1. Strep throat       MDM  Strep throat. No peritonsillar abscess. Nontoxic.  No airway  compromise        Barbara Cower, MD 07/06/12 2155

## 2012-09-16 ENCOUNTER — Emergency Department (INDEPENDENT_AMBULATORY_CARE_PROVIDER_SITE_OTHER)
Admission: EM | Admit: 2012-09-16 | Discharge: 2012-09-16 | Disposition: A | Discharge status: Home / Self Care | Payer: Self-pay | Source: Home / Self Care | Attending: Family Medicine | Admitting: Family Medicine

## 2012-09-16 ENCOUNTER — Encounter (HOSPITAL_COMMUNITY): Payer: Self-pay | Admitting: *Deleted

## 2012-09-16 DIAGNOSIS — J020 Streptococcal pharyngitis: Secondary | ICD-10-CM

## 2012-09-16 MED ORDER — CEFDINIR 300 MG PO CAPS: 300.0000 mg | ORAL_CAPSULE | Freq: Two times a day (BID) | ORAL | Status: DC

## 2012-09-16 NOTE — ED Notes (Signed)
Pt  Has  Symptoms  Of  generalised  Body  Aches          With  Headaches          sorethroat         Fatigue  Since  Yesterday  -  At  This  Time  She  Is  Sitting  Upright on exam table    Speaking in  Complete  sentances          Skin is  Warm and  Dry

## 2012-09-16 NOTE — ED Provider Notes (Signed)
History     CSN: [id number]  Arrival date & time 09/16/12  27   First MD Initiated Contact with Patient 09/16/12 1609      Chief Complaint  Patient presents with  . Generalized Body Aches    (Consider location/radiation/quality/duration/timing/severity/associated sxs/prior treatment) Patient is a 26 y.o. female presenting with URI. The history is provided by the patient.  URI The primary symptoms include fatigue, sore throat, swollen glands and myalgias. Primary symptoms do not include cough, abdominal pain, nausea, vomiting or rash. The current episode started yesterday. This is a new problem. The problem has not changed since onset. Symptoms associated with the illness include congestion and rhinorrhea.    Past Medical History  Diagnosis Date  . Crohn's disease   . Abnormal Pap smear     ASCUS 2011    Past Surgical History  Procedure Date  . Small intestine surgery   . Cesarean section 04/14/2012    Procedure: CESAREAN SECTION;  Surgeon: Osborne Oman, MD;  Location: [facility];  Service: Gynecology;  Laterality: N/A;  primary    Family History  Problem Relation Age of Onset  . Diabetes Paternal Grandmother   . Anesthesia problems Neg Hx     History  Substance Use Topics  . Smoking status: Former Smoker -- 0.2 packs/day for 2 years    Types: Cigarettes    Quit date: 09/16/2011  . Smokeless tobacco: Never Used  . Alcohol Use: No     prior to pregnancy only    OB History    Grav Para Term Preterm Abortions TAB SAB Ect Mult Living   3 3 0 3 0 0 0 0 1 4       Review of Systems  Constitutional: Positive for fatigue.  HENT: Positive for congestion, sore throat and rhinorrhea.   Respiratory: Negative for cough.   Gastrointestinal: Negative.  Negative for nausea, vomiting and abdominal pain.  Musculoskeletal: Positive for myalgias.  Skin: Negative for rash.    Allergies  Review of patient's allergies indicates no known allergies.  Home Medications    Current Outpatient Rx  Name Route Sig Dispense Refill  . CEFDINIR 300 MG PO CAPS Oral Take 1 capsule (300 mg total) by mouth 2 (two) times daily. 20 capsule 0    BP 129/90  Pulse 93  Temp 98.5 F (36.9 C) (Oral)  Resp 19  SpO2 100%  Physical Exam  Nursing note and vitals reviewed. Constitutional: She is oriented to person, place, and time. She appears well-developed and well-nourished.  HENT:  Head: Normocephalic.  Right Ear: External ear normal.  Left Ear: External ear normal.  Mouth/Throat: Oropharyngeal exudate present.  Eyes: Pupils are equal, round, and reactive to light.  Neck: Normal range of motion. Neck supple.  Pulmonary/Chest: Breath sounds normal.  Abdominal: Bowel sounds are normal. There is no tenderness.  Musculoskeletal: Normal range of motion.  Lymphadenopathy:    She has cervical adenopathy.  Neurological: She is alert and oriented to person, place, and time.  Skin: Skin is warm and dry. No rash noted.    ED Course  Procedures (including critical care time)  Labs Reviewed  POCT RAPID STREP A ([facility] ONLY) - Abnormal; Notable for the following:    Streptococcus, Group A Screen (Direct) POSITIVE (*)     All other components within normal limits   No results found.   1. Strep throat       MDM  Strep --pos.  Billy Fischer, MD 09/16/12 [phone]

## 2013-07-28 ENCOUNTER — Telehealth: Payer: Self-pay | Admitting: Obstetrics and Gynecology

## 2013-07-29 NOTE — Telephone Encounter (Signed)
Pt not seeing [place] anymore, but we put in her nexplanon. Now having some irregular bleeding. Pt advised to make an appointment. Pt does not have insurance but was quoted amount for the visit.

## 2014-03-22 ENCOUNTER — Emergency Department (HOSPITAL_COMMUNITY)
Admission: EM | Admit: 2014-03-22 | Discharge: 2014-03-22 | Disposition: A | Discharge status: Home / Self Care | Payer: 59 | Attending: Emergency Medicine | Admitting: Emergency Medicine

## 2014-03-22 ENCOUNTER — Encounter (HOSPITAL_COMMUNITY): Payer: Self-pay | Admitting: Emergency Medicine

## 2014-03-22 DIAGNOSIS — W268XXA Contact with other sharp object(s), not elsewhere classified, initial encounter: Secondary | ICD-10-CM | POA: Insufficient documentation

## 2014-03-22 DIAGNOSIS — Z87891 Personal history of nicotine dependence: Secondary | ICD-10-CM | POA: Insufficient documentation

## 2014-03-22 DIAGNOSIS — K5090 Crohn's disease, unspecified, without complications: Secondary | ICD-10-CM | POA: Insufficient documentation

## 2014-03-22 DIAGNOSIS — Y929 Unspecified place or not applicable: Secondary | ICD-10-CM | POA: Insufficient documentation

## 2014-03-22 DIAGNOSIS — S91311A Laceration without foreign body, right foot, initial encounter: Secondary | ICD-10-CM

## 2014-03-22 DIAGNOSIS — Z23 Encounter for immunization: Secondary | ICD-10-CM | POA: Insufficient documentation

## 2014-03-22 DIAGNOSIS — Y9301 Activity, walking, marching and hiking: Secondary | ICD-10-CM | POA: Insufficient documentation

## 2014-03-22 DIAGNOSIS — S91309A Unspecified open wound, unspecified foot, initial encounter: Secondary | ICD-10-CM | POA: Insufficient documentation

## 2014-03-22 LAB — POC URINE PREG, ED: PREG TEST UR: NEGATIVE

## 2014-03-22 MED ORDER — TETANUS-DIPHTH-ACELL PERTUSSIS 5-2.5-18.5 LF-MCG/0.5 IM SUSP
0.5000 mL | Freq: Once | INTRAMUSCULAR | Status: AC
  Administered 2014-03-22: 0.5 mL via INTRAMUSCULAR
  Filled 2014-03-22: qty 0.5

## 2014-03-22 MED ORDER — CIPROFLOXACIN HCL 500 MG PO TABS: 500.0000 mg | ORAL_TABLET | Freq: Two times a day (BID) | ORAL | Status: AC

## 2014-03-22 NOTE — ED Provider Notes (Signed)
CSN: [id number]     Arrival date & time 03/22/14  1928 History   This chart was scribed for non-physician practitioner, Vernie Murders, PA-C, working with Saddie Benders. Dorna Mai, MD by Celesta Gentile, ED Scribe. This patient was seen in room TR10C/TR10C and the patient's care was started at 8:52 PM.    Chief Complaint  Patient presents with  . Laceration   The history is provided by the patient. No language interpreter was used.   HPI Comments: Lauren Ramirez is a 28 y.o. female who presents to the Emergency Department complaining of two lacerations to the right foot. Pt states a piece of metal that separates carpet and vinyl was bent backwards. Patient states it jammed into the side of her right foot while she was wearing shoes. She took off her shoes to examine the area and accidentally stepped on the metal corner again sustaining another laceration this time to the bottom of her foot. Pt denies any foreign bodies. Pt denies numbness, weakness or loss of sensation in her right foot. She states she applied alcohol PTA. Bleeding controlled. Pt is unsure if her tetanus is UTD. Pt states she is other wise healthy. LNMP at least 2 months ago. Patient has Implanon. Took home pregnancy which was negative.     Past Medical History  Diagnosis Date  . Crohn's disease   . Abnormal Pap smear     ASCUS 2011   Past Surgical History  Procedure Laterality Date  . Small intestine surgery    . Cesarean section  04/14/2012    Procedure: CESAREAN SECTION;  Surgeon: Osborne Oman, MD;  Location: [facility];  Service: Gynecology;  Laterality: N/A;  primary   Family History  Problem Relation Age of Onset  . Diabetes Paternal Grandmother   . Anesthesia problems Neg Hx    History  Substance Use Topics  . Smoking status: Former Smoker -- 0.25 packs/day for 2 years    Types: Cigarettes    Quit date: 09/16/2011  . Smokeless tobacco: Never Used  . Alcohol Use: No     Comment: prior to pregnancy only   OB  History   Grav Para Term Preterm Abortions TAB SAB Ect Mult Living   3 3 0 3 0 0 0 0 1 4      Review of Systems  Cardiovascular: Negative for leg swelling.  Musculoskeletal: Negative for myalgias.  Skin: Positive for wound (Bottom of right foot.). Negative for color change and rash.  Neurological: Negative for weakness and numbness.  All other systems reviewed and are negative.   Allergies  Review of patient's allergies indicates no known allergies.  Home Medications   Prior to Admission medications   Not on File   Triage Vitals:  BP 140/85  Pulse 103  Temp(Src) 98.3 F (36.8 C) (Oral)  Resp 18  Wt 238 lb 5 oz (108.098 kg)  SpO2 100%  Filed Vitals:   03/22/14 1939 03/22/14 2121  BP: 140/85 115/71  Pulse: 103 99  Temp: 98.3 F (36.8 C)   TempSrc: Oral   Resp: 18 20  Weight: 238 lb 5 oz (108.098 kg)   SpO2: 100% 100%    Physical Exam  Nursing note and vitals reviewed. Constitutional: She is oriented to person, place, and time. She appears well-developed and well-nourished. No distress.  HENT:  Head: Normocephalic.  Right Ear: External ear normal.  Left Ear: External ear normal.  Eyes: Conjunctivae are normal. Right eye exhibits no discharge. Left eye exhibits no  discharge.  Neck: Neck supple.  Cardiovascular: Normal rate.   Dorsalis pedis pulses present and equal bilaterally  Pulmonary/Chest: Effort normal.  Musculoskeletal: Normal range of motion. She exhibits no edema and no tenderness.       Feet:  ROM intact in the left foot. Patient able to ambulate without difficulty or ataxia  Neurological: She is alert and oriented to person, place, and time.  Skin: Skin is warm and dry. She is not diaphoretic.  5 mm laceration to the left medial distal foot. 1 cm laceration to the arch of the left foot. Bleeding controlled. No surrounding edema or erythema. No foreign bodies.     ED Course  LACERATION REPAIR Date/Time: 03/15/2014 9:30 PM Performed by: Vernie Murders K Authorized by: Lucila [place] Consent: Verbal consent obtained. Risks and benefits: risks, benefits and alternatives were discussed Consent given by: patient Patient identity confirmed: verbally with patient Body area: lower extremity Laceration length: 1 cm Foreign bodies: no foreign bodies Tendon involvement: none Nerve involvement: none Vascular damage: no Anesthesia: local infiltration Local anesthetic: lidocaine 2% without epinephrine Anesthetic total: 2 ml Patient sedated: no Irrigation solution: saline Irrigation method: tap Amount of cleaning: standard Debridement: none Degree of undermining: none Skin closure: Steri-Strips Dressing: antibiotic ointment Patient tolerance: Patient tolerated the procedure well with no immediate complications. Comments: Wound explored for foreign bodies. None found.    (including critical care time) DIAGNOSTIC STUDIES: Oxygen Saturation is 100% on RA, normal by my interpretation.    Results for orders placed during the hospital encounter of 03/22/14  POC URINE PREG, ED      Result Value Ref Range   Preg Test, Ur NEGATIVE  NEGATIVE    MDM   Lauren Ramirez is a 28 y.o. female who presents to the Emergency Department complaining of two lacerations to the right foot. Laceration to bottom of right foot repaired with steri-strips. Wound explored for foreign bodies. None found. Right medial laceration did not require repair. Antibiotic ointment and dressing applied. Patient neurovascularly intact. Tetanus booster given in the ED. Will treat patient with antibiotics to cover possible pseudomonas infection. Patient concerned about pregnancy. Pregnancy test negative. Wound care discussed with patient. Return precautions, discharge instructions, and follow-up was discussed with the patient before discharge.     Discharge Medication List as of 03/22/2014  9:38 PM    START taking these medications   Details  ciprofloxacin  (CIPRO) 500 MG tablet Take 1 tablet (500 mg total) by mouth every 12 (twelve) hours., Starting 03/22/2014, Last dose on Tue 03/29/14, Print        Final impressions: 1. Laceration of right foot       Harold Hedge [place], [place] 03/25/14 2032

## 2014-03-22 NOTE — ED Notes (Signed)
Pt. presents with 2 small  lacerations at right foot , accidentally hit it against a metal piece at home this evening , no bleeding at arrival .

## 2014-03-22 NOTE — Discharge Instructions (Signed)
Keep area clean and dry - wash several times a day with gentle soap and water Take antibiotic for full dose  Return to the emergency department if you develop any changing/worsening condition, drainage of pus, spreading redness/swelling, severe pain or any other concerns (please read additional information regarding your condition below)    Laceration Care, Adult A laceration is a cut or lesion that goes through all layers of the skin and into the tissue just beneath the skin. TREATMENT  Some lacerations may not require closure. Some lacerations may not be able to be closed due to an increased risk of infection. It is important to see your caregiver as soon as possible after an injury to minimize the risk of infection and maximize the opportunity for successful closure. If closure is appropriate, pain medicines may be given, if needed. The wound will be cleaned to help prevent infection. Your caregiver will use stitches (sutures), staples, wound glue (adhesive), or skin adhesive strips to repair the laceration. These tools bring the skin edges together to allow for faster healing and a better cosmetic outcome. However, all wounds will heal with a scar. Once the wound has healed, scarring can be minimized by covering the wound with sunscreen during the day for 1 full year. HOME CARE INSTRUCTIONS  For sutures or staples:  Keep the wound clean and dry.  If you were given a bandage (dressing), you should change it at least once a day. Also, change the dressing if it becomes wet or dirty, or as directed by your caregiver.  Wash the wound with soap and water 2 times a day. Rinse the wound off with water to remove all soap. Pat the wound dry with a clean towel.  After cleaning, apply a thin layer of the antibiotic ointment as recommended by your caregiver. This will help prevent infection and keep the dressing from sticking.  You may shower as usual after the first 24 hours. Do not soak the wound in  water until the sutures are removed.  Only take over-the-counter or prescription medicines for pain, discomfort, or fever as directed by your caregiver.  Get your sutures or staples removed as directed by your caregiver. For skin adhesive strips:  Keep the wound clean and dry.  Do not get the skin adhesive strips wet. You may bathe carefully, using caution to keep the wound dry.  If the wound gets wet, pat it dry with a clean towel.  Skin adhesive strips will fall off on their own. You may trim the strips as the wound heals. Do not remove skin adhesive strips that are still stuck to the wound. They will fall off in time. For wound adhesive:  You may briefly wet your wound in the shower or bath. Do not soak or scrub the wound. Do not swim. Avoid periods of heavy perspiration until the skin adhesive has fallen off on its own. After showering or bathing, gently pat the wound dry with a clean towel.  Do not apply liquid medicine, cream medicine, or ointment medicine to your wound while the skin adhesive is in place. This may loosen the film before your wound is healed.  If a dressing is placed over the wound, be careful not to apply tape directly over the skin adhesive. This may cause the adhesive to be pulled off before the wound is healed.  Avoid prolonged exposure to sunlight or tanning lamps while the skin adhesive is in place. Exposure to ultraviolet light in the first year will  darken the scar.  The skin adhesive will usually remain in place for 5 to 10 days, then naturally fall off the skin. Do not pick at the adhesive film. You may need a tetanus shot if:  You cannot remember when you had your last tetanus shot.  You have never had a tetanus shot. If you get a tetanus shot, your arm may swell, get red, and feel warm to the touch. This is common and not a problem. If you need a tetanus shot and you choose not to have one, there is a rare chance of getting tetanus. Sickness from  tetanus can be serious. SEEK MEDICAL CARE IF:   You have redness, swelling, or increasing pain in the wound.  You see a red line that goes away from the wound.  You have yellowish-white fluid (pus) coming from the wound.  You have a fever.  You notice a bad smell coming from the wound or dressing.  Your wound breaks open before or after sutures have been removed.  You notice something coming out of the wound such as wood or glass.  Your wound is on your hand or foot and you cannot move a finger or toe. SEEK IMMEDIATE MEDICAL CARE IF:   Your pain is not controlled with prescribed medicine.  You have severe swelling around the wound causing pain and numbness or a change in color in your arm, hand, leg, or foot.  Your wound splits open and starts bleeding.  You have worsening numbness, weakness, or loss of function of any joint around or beyond the wound.  You develop painful lumps near the wound or on the skin anywhere on your body. MAKE SURE YOU:   Understand these instructions.  Will watch your condition.  Will get help right away if you are not doing well or get worse. Document Released: 11/18/2005 Document Revised: 02/10/2012 Document Reviewed: 05/14/2011 [facility] Patient Information 2014 [place], [place].   Emergency Department Resource Guide 1) Find a Doctor and Pay Out of Pocket Although you won't have to find out who is covered by your insurance plan, it is a good idea to ask around and get recommendations. You will then need to call the office and see if the doctor you have chosen will accept you as a new patient and what types of options they offer for patients who are self-pay. Some doctors offer discounts or will set up payment plans for their patients who do not have insurance, but you will need to ask so you aren't surprised when you get to your appointment.  2) Contact Your Local Health Department Not all health departments have doctors that can see patients  for sick visits, but many do, so it is worth a call to see if yours does. If you don't know where your local health department is, you can check in your phone book. The CDC also has a tool to help you locate your state's health department, and many state websites also have listings of all of their local health departments.  3) Find a [facility] If your illness is not likely to be very severe or complicated, you may want to try a walk in clinic. These are popping up all over the country in pharmacies, drugstores, and shopping centers. They're usually staffed by nurse practitioners or physician assistants that have been trained to treat common illnesses and complaints. They're usually fairly quick and inexpensive. However, if you have serious medical issues or chronic medical problems, these are probably  not your best option.  No Primary Care Doctor: - Call Health Connect at  [phone] - they can help you locate a primary care doctor that  accepts your insurance, provides certain services, etc. - Physician Referral Service- [phone]  Chronic Pain Problems: Organization         Address  Phone   Notes  [facility]  [phone] Patients need to be referred by their primary care doctor.   Medication Assistance: Organization         Address  Phone   Notes  [facility] Medication [facility] [address] [place], [place] [address] [phone] --Must be a resident of [facility] -- Must have NO insurance coverage whatsoever (no Medicaid/ Medicare, etc.) -- The pt. MUST have a primary care doctor that directs their care regularly and follows them in the community   Med[facility]ist  [phone]   [employer]  [phone]    Agencies that provide inexpensive medical care: Organization         Address  Phone   Notes  [place]  [phone]   [facility]    [phone]   [facility] [place], [place] [address] [phone]   [place] [address] [place] [phone]   Planned Parenthood    [phone]    Clinic    [phone]   [place] and [place] [address], [place] Phone:  [phone], Fax:  [phone] Hours of Operation:  9 am - 6 pm, M-F.  Also accepts Medicaid/Medicare and self-pay.  [facility] for [address], [address], [place] Phone: [phone], Fax: [phone]. Hours of Operation:  8:30 am - 5:30 pm, M-F.  Also accepts Medicaid and self-pay.  [facility] [place] [address], [place] Phone: [phone]   [place], [place], [place] [phone], Ext. [phone] Mondays & Thursdays: 7-9 AM.  First 15 patients are seen on a first come, first serve basis.    [place] Providers:  Organization         Address  Phone   Notes  [facility] [address], [place] [phone] Also accepts self-pay patients.  [facility] [address], [place]  [phone]   [place], [address], [place] [phone]   [facility] [address] [place] [phone]   Lucianne Lei [address], [place]   [phone] Only accepts [place] Access [place] patients after they have their name applied to their card.   Self-Pay (no insurance) in [facility]:  Organization         Address  Phone   Notes  Sickle Cell Patients, [facility] [place] [phone]   [facility]gent Care [place] [phone]   [facility] [place]  [place], [place], [place] [phone]   [facility]/Dr. Osei-Bonsu  [address], [place] or [address], [place] [phone] Phone number for both [place] and [place] locations is the same.  [facility] [address], [place] [phone]   [facility] [address], [place] or [address] [phone]   [facility] [address], [place] [phone], phone; [phone], fax Sees patients 1st and 3rd Saturday of every month.  Must not qualify for public or private insurance (i.e. Medicaid, Medicare, [place] Health Choice, Veterans' Benefits)  Household income should be no more than 200% of the poverty level The clinic cannot treat you if you are pregnant or think you are pregnant  Sexually transmitted diseases are not treated at the clinic.    Dental Care: Organization         Address  Phone  Notes  [facility] Department of [facility] [phone] Accepts children up to age 45 who are enrolled in [place] or [place]; pregnant women with a Medicaid card; and children who have applied for Medicaid or [place] Health Choice, but were declined, whose parents can pay a reduced fee at time of service.  [facility] Department of [facility] [place]  [address], [place] [phone] Accepts children up to age 62 who are enrolled in [place] or [place]; pregnant women with a Medicaid card; and children who have applied for Medicaid or [place] Health Choice, but were declined, whose parents can pay a reduced fee at time of service.  [facility]  [place] [phone] Patients are seen by appointment only. Walk-ins are not accepted. [place] will see patients 42 years of age and older. Monday - Tuesday (8am-5pm) Most Wednesdays (8:30-5pm) $30 per visit, cash only  [facility] Adult [facility] Access [facility]  [address], [facility] [phone] Patients are seen by appointment only. Walk-ins are not accepted. [place] will see patients 1 years of age and older. One Wednesday Evening (Monthly: Volunteer Based).  $30 per visit, cash only  [place]  [phone] for adults; Children under age 72, call Graduate Pediatric Dentistry at [phone]. Children aged 44-14, please call [phone] to request a pediatric application.  Dental services are provided in all areas of dental care including fillings, crowns and bridges, complete and partial dentures, implants, gum treatment, root canals, and extractions. Preventive care is also provided. Treatment is provided to both adults and children. Patients are selected via a lottery and there is often a waiting list.   [facility] [address] [place]  [phone] [email or web address]   [facility] [address] [place], [place] [phone], Ext. [phone] Second and Fourth Thursday of each month, opens at 6:30 AM; Clinic ends at 9 AM.  Patients are seen on a first-come first-served basis, and a limited number are seen during each clinic.   [facility]  [address] Hillard Danker [place], [place] [phone]   Eligibility Requirements You must have lived in [place], [place], or [place] counties for at least the last three months.   You cannot be eligible for state or federal sponsored [employer], including [employer], [place], or [employer].   You generally cannot be eligible for healthcare insurance through your employer.    How to apply: Eligibility screenings are held every Tuesday and Wednesday afternoon from 1:00 pm until 4:00 pm. You do not need an appointment for the interview!  [facility] [address]  Barbara Cower [place], [place]   [place]  [place] Department  [place]  [phone]    Behavioral Health Resources in the Community: Intensive Outpatient Programs Organization         Address  Phone  Notes  [place]. [address] [place], [place] [phone]   [facility] Outpatient [address] [place], [place]   ADS: Alcohol & Drug S[facility] [address], [place], [place]   [place] [address]  [place], [place] or [phone]   Substance Abuse Resources Organization         Address  Phone  Notes  Alcohol and Drug Services  [phone]   [place]  9132689407   [facility] [place]   Chinita Pester  [phone]   Residential & Outpatient Substance Abuse Program  [phone]   Psychological Services Organization         Address  Phone  Notes  [facility]  [place]  [phone]   [place] [address], [place] or [phone]    Mobile Crisis Teams Organization         Address  Phone  Notes  [facility], Mobile Crisis Care Unit  [phone]   [facility]  [address] [place], [place]   [place] [address], [place] [phone]    Self-Help/Support Groups Organization         Address  Phone             Notes  [place]. of [place] - variety of support groups  [place] Call for more information  Narcotics Anonymous (NA), [facility] [address], [employer] [place]  2 meetings at this location   Special educational needs teacher         Address  Phone  Notes  [facility] [place],    [place]  [phone]   [facility]  [address], [place] [address], [place], [place]   [place], [place]  [phone] Admissions: 8am-3pm M-F  Incentives Substance [place] [address],    [place], [place] [phone]   [facility] [address] [place], [place], [place]   [facility] [address]  [place], [place]   [facility]ight Programs - Intensive Outpatient [address] Kristeen [address], [place], [place]   [facility] [address]  [place], [place] [phone] or [phone]   [facility] Services (RTS) [address] [place], [place] Accepts Medicaid  [facility] [place] [address],  [place] [phone] Substance Abuse/Addiction Treatment   [facility] Organization         Address  Phone  Notes  [facility]  [phone]   Domenic Schwab, PhD [address] Arlis Port[place], [place]   [phone] or [phone]   [place], [place] [phone]   [place] [address] [place], [place] [phone] Insurance/Medicaid/sponsorship through [employer] and Families [address], [place] [address]  [place], [place] [phone] [place], [place] [phone]    Dr. Adele Schilder  [phone]   [facility] Dept. 1) [address] [place] 2) [place] 3)  [place] [address], [place] [phone]  [phone]   [place] [phone] or [phone] (After Hours)

## 2014-03-26 NOTE — ED Provider Notes (Signed)
Medical screening examination/treatment/procedure(s) were performed by non-physician practitioner and as supervising physician I was immediately available for consultation/collaboration.  Saddie Benders. Cheyeanne Roadcap, MD 03/26/14 1419

## 2014-05-28 ENCOUNTER — Emergency Department (HOSPITAL_COMMUNITY)
Admission: EM | Admit: 2014-05-28 | Discharge: 2014-05-28 | Disposition: A | Discharge status: Home / Self Care | Payer: 59 | Attending: Emergency Medicine | Admitting: Emergency Medicine

## 2014-05-28 ENCOUNTER — Encounter (HOSPITAL_COMMUNITY): Payer: Self-pay | Admitting: Emergency Medicine

## 2014-05-28 DIAGNOSIS — Z87891 Personal history of nicotine dependence: Secondary | ICD-10-CM | POA: Insufficient documentation

## 2014-05-28 DIAGNOSIS — Z8719 Personal history of other diseases of the digestive system: Secondary | ICD-10-CM | POA: Insufficient documentation

## 2014-05-28 DIAGNOSIS — G518 Other disorders of facial nerve: Secondary | ICD-10-CM | POA: Insufficient documentation

## 2014-05-28 DIAGNOSIS — R259 Unspecified abnormal involuntary movements: Secondary | ICD-10-CM | POA: Insufficient documentation

## 2014-05-28 DIAGNOSIS — G5139 Clonic hemifacial spasm, unspecified: Secondary | ICD-10-CM

## 2014-05-28 NOTE — ED Provider Notes (Signed)
CSN: [id number]     Arrival date & time 05/28/14  1628 History   First MD Initiated Contact with Patient 05/28/14 1837     Chief Complaint  Patient presents with  . Numbness   HPI Comments: Patient is a 28 y.o. Female who presents to the ED with left sided face numbness that lasted for approximately 2.5 hours.  Patient stated that she had a mild eye twitch all day, but as she was leaving work her left side of the face tightened and then released after 5-10 seconds.  Patient states that after that she had some mild numbness.  This was accompanied by a mild headache which she took excedrin.  The headache resolved.  She is without any complaints at the time for the interview.  She states that as far as she knows she is otherwise healthy, but does not go to the doctor regularly.    The history is provided by the patient. No language interpreter was used.    Past Medical History  Diagnosis Date  . Crohn's disease   . Abnormal Pap smear     ASCUS 2011   Past Surgical History  Procedure Laterality Date  . Small intestine surgery    . Cesarean section  04/14/2012    Procedure: CESAREAN SECTION;  Surgeon: Osborne Oman, MD;  Location: [facility];  Service: Gynecology;  Laterality: N/A;  primary   Family History  Problem Relation Age of Onset  . Diabetes Paternal Grandmother   . Anesthesia problems Neg Hx    History  Substance Use Topics  . Smoking status: Former Smoker -- 0.25 packs/day for 2 years    Types: Cigarettes    Quit date: 04/15/2014  . Smokeless tobacco: Never Used  . Alcohol Use: Yes     Comment: only occasionally   OB History   Grav Para Term Preterm Abortions TAB SAB Ect Mult Living   3 3 0 3 0 0 0 0 1 4      Review of Systems  Constitutional: Negative for fever, chills and fatigue.  HENT: Negative for congestion, drooling, facial swelling, sinus pressure, trouble swallowing and voice change.   Respiratory: Negative for cough, chest tightness and shortness of breath.    Cardiovascular: Negative for chest pain and palpitations.  Gastrointestinal: Negative for nausea, vomiting, abdominal pain, diarrhea, constipation and abdominal distention.  Genitourinary: Negative for dysuria, urgency, frequency, hematuria, enuresis and difficulty urinating.  Neurological: Positive for numbness and headaches. Negative for dizziness, tremors, seizures, syncope, facial asymmetry, speech difficulty, weakness and light-headedness.  All other systems reviewed and are negative.     Allergies  Review of patient's allergies indicates no known allergies.  Home Medications   Prior to Admission medications   Not on File   BP 137/75  Pulse 94  Temp(Src) 98.9 F (37.2 C) (Oral)  Resp 18  Ht 5' 6"  (1.676 m)  Wt 238 lb (107.956 kg)  BMI 38.43 kg/m2  SpO2 100% Physical Exam  Nursing note and vitals reviewed. Constitutional: She is oriented to person, place, and time. She appears well-developed and well-nourished. No distress.  HENT:  Head: Normocephalic and atraumatic.  Mouth/Throat: Oropharynx is clear and moist. No oropharyngeal exudate.  Eyes: Conjunctivae and EOM are normal. Pupils are equal, round, and reactive to light. No scleral icterus.  Neck: Normal range of motion. Neck supple. No JVD present. No thyromegaly present.  Cardiovascular: Normal rate, regular rhythm, normal heart sounds and intact distal pulses.  Exam reveals no gallop and  no friction rub.   No murmur heard. Pulmonary/Chest: Effort normal and breath sounds normal. No respiratory distress. She has no wheezes. She has no rales. She exhibits no tenderness.  Abdominal: Soft. Bowel sounds are normal. She exhibits no distension and no mass. There is no tenderness. There is no rebound and no guarding.  Musculoskeletal: Normal range of motion.  Lymphadenopathy:    She has no cervical adenopathy.  Neurological: She is alert and oriented to person, place, and time. No cranial nerve deficit. Coordination  normal.  Skin: Skin is warm and dry. No rash noted. She is not diaphoretic. No erythema. No pallor.  Psychiatric: She has a normal mood and affect. Her behavior is normal. Judgment and thought content normal.    ED Course  Procedures (including critical care time) Labs Review Labs Reviewed - No data to display  Imaging Review No results found.   EKG Interpretation None      MDM   Final diagnoses:  Facial spasm   Patient is a 28 y.o. Female who presents to the ED with facial numbness and facial spasm.  Patient is without complaint at the time of being seen.  Patient has no focal neuro deficits at this time and has a normal exam.  I have offered the patient a BMP to check potassium for possible muscle spasm.  She has declined at this time.  She would like to follow-up with a PCP of her choosing from the resource list at this time.  Differential includes complicated migraine vs facial spasm.  I have given strict return precautions of sudden onset of worst headache of her life, facial drooping, slurred speech, or any other concerning symptoms.  She states her understanding at this time.      Kenard Gower, PA-C 05/28/14 2012

## 2014-05-28 NOTE — ED Notes (Signed)
Intermittently for a couple months has twitch in L eye and L lip; today about 1520 had those symptoms along with facial numbness on L side. Felt like her face looked different in the mirror at the time, but has since resolved. Currently face symmetrical, able to smile, lift eyebrows, stick out tongue with symmetry.

## 2014-05-28 NOTE — Discharge Instructions (Signed)
Emergency Department Resource Guide 1) Find a Doctor and Pay Out of Pocket Although you won't have to find out who is covered by your insurance plan, it is a good idea to ask around and get recommendations. You will then need to call the office and see if the doctor you have chosen will accept you as a new patient and what types of options they offer for patients who are self-pay. Some doctors offer discounts or will set up payment plans for their patients who do not have insurance, but you will need to ask so you aren't surprised when you get to your appointment.  2) Contact Your Local Health Department Not all health departments have doctors that can see patients for sick visits, but many do, so it is worth a call to see if yours does. If you don't know where your local health department is, you can check in your phone book. The CDC also has a tool to help you locate your state's health department, and many state websites also have listings of all of their local health departments.  3) Find a [facility] If your illness is not likely to be very severe or complicated, you may want to try a walk in clinic. These are popping up all over the country in pharmacies, drugstores, and shopping centers. They're usually staffed by nurse practitioners or physician assistants that have been trained to treat common illnesses and complaints. They're usually fairly quick and inexpensive. However, if you have serious medical issues or chronic medical problems, these are probably not your best option.  No Primary Care Doctor: - Call Health Connect at  [phone] - they can help you locate a primary care doctor that  accepts your insurance, provides certain services, etc. - Physician Referral Service- [phone]  Chronic Pain Problems: Organization         Address  Phone   Notes  [facility]  [phone] Patients need to be referred by their primary care doctor.   Medication  Assistance: Organization         Address  Phone   Notes  [facility] Medication [facility] [address] [place], [place] [address] [phone] --Must be a resident of [facility] -- Must have NO insurance coverage whatsoever (no Medicaid/ Medicare, etc.) -- The pt. MUST have a primary care doctor that directs their care regularly and follows them in the community   [facility]ist  [phone]   [employer]  [phone]    Agencies that provide inexpensive medical care: Organization         Address  Phone   Notes  [place]  [phone]   [facility]    [phone]   [facility] [place], [place] [address] [phone]   [place] [address], [place] [phone]   Planned Parenthood    [phone]   [facility]    [phone]   [place] and [address], [place] Phone:  [phone], Fax:  [phone] Hours of Operation:  9 am - 6 pm, M-F.  Also accepts Medicaid/Medicare and self-pay.  [facility] for [place], [address], [place] Phone: [phone], Fax: [phone]. Hours of Operation:  8:30 am - 5:30 pm, M-F.  Also accepts Medicaid and self-pay.  [facility] [address], [place] Phone: [phone]   [place], [place], [place] [phone], Ext. [phone] Mondays & Thursdays: 7-9 AM.  First 15 patients are seen on a first come, first serve basis.    [place] Providers:  Organization         Address  Phone   Notes  [facility] [address], [place] [phone] Also accepts self-pay patients.  [facility] [address], [place]  [phone]   [place], [address], [place]  [phone]   [facility] [address], [place] [phone]   Lucianne Lei [address], [place]   [phone] Only accepts [place] Access [place] patients after they have their name applied to their card.   Self-Pay (no insurance) in [facility]:  Organization         Address  Phone   Notes  Sickle Cell Patients, [facility] [place] [phone]   [facility] [place] [phone]   [facility] [place]  [place], [place], [place] [phone]   [facility]/Dr. Osei-Bonsu  [address] [place] or [address], [place] [phone] Phone number for both [place] and [place] locations is the same.  [facility] [address], [place] [phone]   [facility] [address] [place] or [address] [phone]   [facility] [address], [place] [phone], phone; [phone], fax Sees patients 1st and 3rd Saturday of every month.  Must not qualify for public or private insurance (i.e. Medicaid, Medicare, [place] Health Choice, Veterans' Benefits)  Household income should be no more than 200% of the poverty level The clinic cannot treat you if you are pregnant or think you are pregnant  Sexually transmitted diseases are not treated at the clinic.    Dental Care: Organization         Address  Phone  Notes  [facility] Department of [facility] [place] [phone] Accepts children up to age 34 who are enrolled in [place] or [place]; pregnant women with a Medicaid card; and children who have applied for Medicaid or [place] Health Choice, but were declined, whose parents can pay a reduced fee at time of service.  [facility]  Department of [facility]  [address], [place] [phone] Accepts children up to age 4 who are enrolled in [place] or [place]; pregnant women with a Medicaid card; and children who have applied for Medicaid or  Health Choice, but were declined, whose parents can pay a reduced fee at time of service.  [facility] Adult [facility] Access PRO[facility]  [place] [phone] Patients are seen by appointment only. Walk-ins are not accepted. [facility] will see patients 15 years of age and older. Monday - Tuesday (8am-5pm) Most Wednesdays (8:30-5pm) $30 per visit, cash only  [facility] Adult [facility] Access PROGRAM  [address], [facility] [phone] Patients are seen by appointment only. Walk-ins are not accepted. [place] will see patients 67 years of age and older. One  Wednesday Evening (Monthly: Volunteer Based).  $30 per visit, cash only  [place]  [phone] for adults; Children under age 64, call Graduate Pediatric Dentistry at [phone]. Children aged 69-14, please call [phone] to request a pediatric application.  Dental services are provided in all areas of dental care including fillings, crowns and bridges, complete and partial dentures, implants, gum treatment, root canals, and extractions. Preventive care is also provided. Treatment is provided to both adults and children. Patients are selected via a lottery and there is often a waiting list.   [facility] [address], [place]  [phone] [email or web address]   [facility] [address] [place], [place] [phone], Ext. [phone] Second and Fourth Thursday of each month, opens at 6:30 AM; Clinic ends at 9 AM.  Patients are seen on a first-come first-served basis, and a limited number are seen during each clinic.   [facility]  [address] Hillard Danker [place], [place] [phone]    Eligibility Requirements You must have lived in [place], [place], or [place] counties for at least the last three months.   You cannot be eligible for state or federal sponsored [employer], including [employer], [place], or [employer].   You generally cannot be eligible for healthcare insurance through your employer.    How to apply: Eligibility screenings are held every Tuesday and Wednesday afternoon from 1:00 pm until 4:00 pm. You do not need an appointment for the interview!  [facility] [address] [place], [place]   [place]  [place] Department  [place]  [phone]    Behavioral Health Resources in the Community: Intensive Outpatient Programs Organization         Address  Phone  Notes  [place]. [address] [place], [place] [phone]   [facility] Outpatient [address], [place], [place]   ADS: Alcohol & Drug Svcs [address] [place], [place]   [place] [address]  [place], [place] or [phone]   Substance Abuse Resources Organization         Address  Phone  Notes  Alcohol and Drug Services  [phone]   [place]  [phone]   The [place]   Chinita Pester  [phone]   Residential & Outpatient Substance Abuse Program  [phone]   Psychological Services Organization         Address  Phone  Notes  [facility]  [place]  [phone]   [place] [address] [place] or [phone]    Mobile Crisis Teams Organization         Address  Phone  Notes  [facility], Mobile Crisis Care Unit  [phone]   Assertive Psychotherapeutic Services  [address]  [place], [place]   [place] Levels [address] [place] [phone]    Self-Help/Support Groups Organization         Address  Phone             Notes  [place]. of [place] - variety of support groups  [place] Call for more information  Narcotics Anonymous (NA), [facility] [address], [employer] Portal  2 meetings at this location  Residential Treatment Programs Organization         Address  Phone  Notes  [facility] [address]    [place]  [phone]   [facility]  [address], [place] [address], [place], [place]   Jansen [place], [place] [phone] Admissions: 8am-3pm M-F  Incentives Substance [place] [address]    [place], [place] [phone]   [facility] [address] [place], [place], [place]   [facility] [address],  [place], [place]   [facility]ight Programs - Intensive Outpatient [address], [place], [place]   [facility] [place].,  [place], [place] [phone] or [phone]   [facility] Services (RTS) [address], [place], [place] Accepts Medicaid  [facility] [place] [address],  [place] [phone] Substance Abuse/Addiction Treatment   [facility] Organization         Address  Phone  Notes  [facility]  [phone]   Domenic Schwab, PhD [address] Arlis Porta [place], [place]   [phone] or [phone]   [place], [place] [phone]   [facility] [address], [place], [place] [phone] Insurance/Medicaid/sponsorship through [facility] and Families [address], [address]                                    [place], [place] [phone] [place] [address], [place] [phone]    Dr. Adele Schilder  [phone]   [facility] Dept. 1) [address]  2) [place] 3)  [place], [place] [phone]  [phone]   [place] [phone] or [phone] (After Hours)      Migraine Headache A migraine headache is an intense, throbbing pain on one or both sides of your head. A migraine can last for 30 minutes to several hours. CAUSES  The exact cause of a migraine headache is not always known. However, a migraine may be caused when nerves in the brain become irritated and release chemicals that cause inflammation. This causes pain. Certain things may also trigger migraines, such as:  Alcohol.  Smoking.  Stress.  Menstruation.  Aged cheeses.  Foods or drinks that contain nitrates, glutamate, aspartame, or tyramine.  Lack of sleep.  Chocolate.  Caffeine.  Hunger.  Physical exertion.  Fatigue.  Medicines used to treat chest pain (nitroglycerine), birth control pills, estrogen, and some blood pressure medicines. SIGNS AND SYMPTOMS  Pain on one or both sides of your head.  Pulsating or throbbing pain.  Severe pain that prevents daily activities.  Pain that is aggravated by any physical activity.  Nausea, vomiting, or both.  Dizziness.  Pain with exposure to bright lights, loud noises, or activity.  General sensitivity to bright lights, loud noises, or smells. Before you get a migraine, you may get warning signs that a migraine is coming (aura). An aura may include:  Seeing flashing lights.  Seeing bright spots, halos, or zig-zag lines.  Having tunnel vision or blurred vision.  Having feelings of numbness or  tingling.  Having trouble talking.  Having muscle weakness. DIAGNOSIS  A migraine headache is often diagnosed based  on:  Symptoms.  Physical exam.  A CT scan or MRI of your head. These imaging tests cannot diagnose migraines, but they can help rule out other causes of headaches. TREATMENT Medicines may be given for pain and nausea. Medicines can also be given to help prevent recurrent migraines.  HOME CARE INSTRUCTIONS  Only take over-the-counter or prescription medicines for pain or discomfort as directed by your health care provider. The use of long-term narcotics is not recommended.  Lie down in a dark, quiet room when you have a migraine.  Keep a journal to find out what may trigger your migraine headaches. For example, write down:  What you eat and drink.  How much sleep you get.  Any change to your diet or medicines.  Limit alcohol consumption.  Quit smoking if you smoke.  Get 7-9 hours of sleep, or as recommended by your health care provider.  Limit stress.  Keep lights dim if bright lights bother you and make your migraines worse. SEEK IMMEDIATE MEDICAL CARE IF:   Your migraine becomes severe.  You have a fever.  You have a stiff neck.  You have vision loss.  You have muscular weakness or loss of muscle control.  You start losing your balance or have trouble walking.  You feel faint or pass out.  You have severe symptoms that are different from your first symptoms. MAKE SURE YOU:   Understand these instructions.  Will watch your condition.  Will get help right away if you are not doing well or get worse. Document Released: 11/18/2005 Document Revised: 09/08/2013 Document Reviewed: 07/26/2013 [facility] Patient Information 2015 [place], [place]. This information is not intended to replace advice given to you by your health care provider. Make sure you discuss any questions you have with your health care provider.

## 2014-05-29 NOTE — ED Provider Notes (Signed)
Medical screening examination/treatment/procedure(s) were performed by non-physician practitioner and as supervising physician I was immediately available for consultation/collaboration.   EKG Interpretation None       Virgel Manifold, MD 05/29/14 1747

## 2014-07-04 ENCOUNTER — Encounter (HOSPITAL_COMMUNITY): Payer: Self-pay | Admitting: Emergency Medicine

## 2014-07-04 ENCOUNTER — Emergency Department (HOSPITAL_COMMUNITY): Payer: 59

## 2014-07-04 ENCOUNTER — Emergency Department (HOSPITAL_COMMUNITY)
Admission: EM | Admit: 2014-07-04 | Discharge: 2014-07-04 | Disposition: A | Discharge status: Home / Self Care | Payer: 59 | Attending: Emergency Medicine | Admitting: Emergency Medicine

## 2014-07-04 DIAGNOSIS — Z8719 Personal history of other diseases of the digestive system: Secondary | ICD-10-CM | POA: Insufficient documentation

## 2014-07-04 DIAGNOSIS — R002 Palpitations: Secondary | ICD-10-CM | POA: Insufficient documentation

## 2014-07-04 DIAGNOSIS — R259 Unspecified abnormal involuntary movements: Secondary | ICD-10-CM | POA: Insufficient documentation

## 2014-07-04 DIAGNOSIS — G43109 Migraine with aura, not intractable, without status migrainosus: Secondary | ICD-10-CM | POA: Insufficient documentation

## 2014-07-04 DIAGNOSIS — Z87891 Personal history of nicotine dependence: Secondary | ICD-10-CM | POA: Insufficient documentation

## 2014-07-04 LAB — CBC
HCT: 38.2 % (ref 36.0–46.0)
Hemoglobin: 12.6 g/dL (ref 12.0–15.0)
MCH: 27.8 pg (ref 26.0–34.0)
MCHC: 33 g/dL (ref 30.0–36.0)
MCV: 84.1 fL (ref 78.0–100.0)
Platelets: 370 10*3/uL (ref 150–400)
RBC: 4.54 MIL/uL (ref 3.87–5.11)
RDW: 13.1 % (ref 11.5–15.5)
WBC: 9.3 10*3/uL (ref 4.0–10.5)

## 2014-07-04 LAB — BASIC METABOLIC PANEL
Anion gap: 13 (ref 5–15)
BUN: 7 mg/dL (ref 6–23)
CO2: 22 mEq/L (ref 19–32)
Calcium: 8.7 mg/dL (ref 8.4–10.5)
Chloride: 102 mEq/L (ref 96–112)
Creatinine, Ser: 0.74 mg/dL (ref 0.50–1.10)
GFR calc non Af Amer: >90 mL/min (ref >90)
GLUCOSE: 91 mg/dL (ref 70–99)
POTASSIUM: 3.9 meq/L (ref 3.7–5.3)
SODIUM: 137 meq/L (ref 137–147)

## 2014-07-04 LAB — I-STAT TROPONIN, ED: TROPONIN I, POC: 0 ng/mL (ref 0.00–0.08)

## 2014-07-04 NOTE — ED Notes (Signed)
Pt c/o palpitations and facial spasms x months intermittently starting again today

## 2014-07-04 NOTE — ED Provider Notes (Signed)
CSN: [id number]     Arrival date & time 07/04/14  1848 History   First MD Initiated Contact with Patient 07/04/14 2130     Chief Complaint  Patient presents with  . Palpitations  . Spasms     (Consider location/radiation/quality/duration/timing/severity/associated sxs/prior Treatment) Patient is a 28 y.o. female presenting with migraines. The history is provided by the patient.  Migraine This is a new problem. The current episode started today. The problem occurs constantly. The problem has been resolved. Associated symptoms include headaches. Pertinent negatives include no arthralgias, chest pain, chills, congestion, fever, myalgias, nausea, numbness or vomiting. Nothing aggravates the symptoms.    Patient is a 28 y.o. female who presents with headache.  This started this morning.  Patient with a history of the same. Patient states that she gets these headaches sometimes she also has facial twitching on her left side. Patient today also with palpitations. Patient has also had palpitations before with headache. Patient's headache similar to prior resolved with Excedrin migraine. With the headache the other symptoms resolved as well. Patient denies any presyncopal feeling patient denies any chest pain shortness breath.  Past Medical History  Diagnosis Date  . Crohn's disease   . Abnormal Pap smear     ASCUS 2011   Past Surgical History  Procedure Laterality Date  . Small intestine surgery    . Cesarean section  04/14/2012    Procedure: CESAREAN SECTION;  Surgeon: Osborne Oman, MD;  Location: [facility];  Service: Gynecology;  Laterality: N/A;  primary   Family History  Problem Relation Age of Onset  . Diabetes Paternal Grandmother   . Anesthesia problems Neg Hx    History  Substance Use Topics  . Smoking status: Former Smoker -- 0.25 packs/day for 2 years    Types: Cigarettes    Quit date: 04/15/2014  . Smokeless tobacco: Never Used  . Alcohol Use: Yes     Comment: only  occasionally   OB History   Grav Para Term Preterm Abortions TAB SAB Ect Mult Living   3 3 0 3 0 0 0 0 1 4      Review of Systems  Constitutional: Negative for fever and chills.  HENT: Negative for congestion and rhinorrhea.        Facial twitching  Eyes: Negative for redness and visual disturbance.  Respiratory: Negative for shortness of breath and wheezing.   Cardiovascular: Negative for chest pain and palpitations.  Gastrointestinal: Negative for nausea and vomiting.  Genitourinary: Negative for dysuria and urgency.  Musculoskeletal: Negative for arthralgias and myalgias.  Skin: Negative for pallor and wound.  Neurological: Positive for headaches. Negative for dizziness and numbness.      Allergies  Review of patient's allergies indicates no known allergies.  Home Medications   Prior to Admission medications   Not on File   BP 134/83  Pulse 99  Temp(Src) 98.5 F (36.9 C) (Oral)  Resp 16  SpO2 99% Physical Exam  Constitutional: She is oriented to person, place, and time. She appears well-developed and well-nourished. No distress.  HENT:  Head: Normocephalic and atraumatic.  Eyes: EOM are normal. Pupils are equal, round, and reactive to light.  Neck: Normal range of motion. Neck supple.  Cardiovascular: Normal rate and regular rhythm.  Exam reveals no gallop and no friction rub.   No murmur heard. Pulmonary/Chest: Effort normal. She has no wheezes. She has no rales.  Abdominal: Soft. She exhibits no distension. There is no tenderness. There is no rebound.  Musculoskeletal: She exhibits no edema and no tenderness.  Neurological: She is alert and oriented to person, place, and time. GCS eye subscore is 4. GCS verbal subscore is 5. GCS motor subscore is 6.  Ambulates without difficulty.    Skin: Skin is warm and dry. She is not diaphoretic.  Psychiatric: She has a normal mood and affect. Her behavior is normal.    ED Course  Procedures (including critical care  time) Labs Review Labs Reviewed  [place], ED    Imaging Review Dg Chest 2 View  07/04/2014   CLINICAL DATA:  Palpitations and spasms  EXAM: CHEST  2 VIEW  COMPARISON:  None.  FINDINGS: The heart size and mediastinal contours are within normal limits. Both lungs are clear. The visualized skeletal structures are unremarkable.  IMPRESSION: No active cardiopulmonary disease.   Electronically Signed   By: Franchot Gallo M.D.   On: 07/04/2014 20:11     EKG Interpretation   Date/Time:  Monday July 04 2014 18:56:40 EDT Ventricular Rate:  99 PR Interval:  124 QRS Duration: 82 QT Interval:  364 QTC Calculation: 467 R Axis:   65 Text Interpretation:  Normal sinus rhythm Normal ECG No old tracing to  compare Confirmed by Penermon ([id number]) on 07/04/2014 9:17:35 PM      MDM   Final diagnoses:  Complicated migraine    Patient is a 28 y.o. female who presents with headache.  This started this morning.  Patient with a history of the same. Patient states that she gets these headaches sometimes she also has facial twitching on her left side. Patient today also with palpitations. Patient has also had palpitations before with headache. Patient's headache similar to prior resolved with Excedrin migraine. With the headache the other symptoms resolved as well. Patient denies any presyncopal feeling patient denies any chest pain shortness breath.  Patient with negative troponin negative chest x-ray unremarkable EKG. We'll discharge the patient home she will follow up with the wellness center and neurology.    9:46 PM:  I have discussed the diagnosis/risks/treatment options with the patient and believe the pt to be eligible for discharge home to follow-up with PCP, neurology. We also discussed returning to the ED immediately if new or worsening sx occur. We discussed the sx which are most concerning (e.g., chest pain, syncope) that necessitate immediate  return. Medications administered to the patient during their visit and any new prescriptions provided to the patient are listed below.  Medications given during this visit Medications - No data to display  New Prescriptions   No medications on file     Deno Etienne, MD 07/04/14 0929  Deno Etienne, MD 07/04/14 2146

## 2014-07-05 NOTE — ED Provider Notes (Signed)
I saw and evaluated the patient, reviewed the resident's note and I agree with the findings and plan.   EKG Interpretation   Date/Time:  Monday July 04 2014 18:56:40 EDT Ventricular Rate:  99 PR Interval:  124 QRS Duration: 82 QT Interval:  364 QTC Calculation: 467 R Axis:   65 Text Interpretation:  Normal sinus rhythm Normal ECG No old tracing to  compare Confirmed by Bertin Inabinet  MD, Aubryanna Nesheim ([id number]) on 07/04/2014 9:17:35 PM       Patient with nonspecific palpitations and facial twitching. EKG benign. Workup negative. Stable for outpatient follow up  Ephraim Hamburger, MD 07/05/14 2053

## 2014-07-13 DIAGNOSIS — Z87891 Personal history of nicotine dependence: Secondary | ICD-10-CM | POA: Insufficient documentation

## 2014-07-13 DIAGNOSIS — Z8719 Personal history of other diseases of the digestive system: Secondary | ICD-10-CM | POA: Insufficient documentation

## 2014-07-13 DIAGNOSIS — R51 Headache: Secondary | ICD-10-CM | POA: Insufficient documentation

## 2014-07-14 ENCOUNTER — Encounter (HOSPITAL_COMMUNITY): Payer: Self-pay | Admitting: Emergency Medicine

## 2014-07-14 ENCOUNTER — Emergency Department (HOSPITAL_COMMUNITY)
Admission: EM | Admit: 2014-07-14 | Discharge: 2014-07-14 | Disposition: A | Discharge status: Home / Self Care | Payer: 59 | Attending: Emergency Medicine | Admitting: Emergency Medicine

## 2014-07-14 DIAGNOSIS — R519 Headache, unspecified: Secondary | ICD-10-CM

## 2014-07-14 DIAGNOSIS — R51 Headache: Secondary | ICD-10-CM

## 2014-07-14 MED ORDER — METOCLOPRAMIDE HCL 5 MG/ML IJ SOLN
10.0000 mg | INTRAMUSCULAR | Status: AC
  Administered 2014-07-14: 10 mg via INTRAVENOUS
  Filled 2014-07-14: qty 2

## 2014-07-14 MED ORDER — DIPHENHYDRAMINE HCL 50 MG/ML IJ SOLN
25.0000 mg | Freq: Once | INTRAMUSCULAR | Status: AC
  Administered 2014-07-14: 25 mg via INTRAVENOUS
  Filled 2014-07-14: qty 1

## 2014-07-14 MED ORDER — DEXAMETHASONE SODIUM PHOSPHATE 10 MG/ML IJ SOLN
10.0000 mg | Freq: Once | INTRAMUSCULAR | Status: AC
  Administered 2014-07-14: 10 mg via INTRAVENOUS
  Filled 2014-07-14: qty 1

## 2014-07-14 MED ORDER — KETOROLAC TROMETHAMINE 30 MG/ML IJ SOLN
30.0000 mg | Freq: Once | INTRAMUSCULAR | Status: AC
  Administered 2014-07-14: 30 mg via INTRAVENOUS
  Filled 2014-07-14: qty 1

## 2014-07-14 NOTE — ED Provider Notes (Signed)
CSN: [id number]     Arrival date & time 07/13/14  2352 History   First MD Initiated Contact with Patient 07/14/14 0531     Chief Complaint  Patient presents with  . Headache     (Consider location/radiation/quality/duration/timing/severity/associated sxs/prior Treatment) HPI Comments: Patient is a 81 her old female with a history of Crohn's disease who presents to the emergency department for headache. Patient states that her headache has been present intermittently x2 weeks. She describes the pain as a dull ache and states it travels between her left and right temporal region. Pain, however, does not radiate. Patient has taken Excedrin Migraine and Goody Powders for symptoms with mild intermittent relief. She denies any aggravating factors of her symptoms. Patient denies associated fever, vision changes or vision loss, tinnitus or hearing loss, difficulty speaking or swallowing, photophobia, phonophobia, nausea, vomiting, numbness/paresthesias, and extremity weakness. Patient denies a history of trauma or injury to her head. She was seen for similar symptoms on 07/04/2014, but has not followed up with neurology as recommended.  Patient is a 28 y.o. female presenting with headaches. The history is provided by the patient. No language interpreter was used.  Headache   Past Medical History  Diagnosis Date  . Crohn's disease   . Abnormal Pap smear     ASCUS 2011   Past Surgical History  Procedure Laterality Date  . Small intestine surgery    . Cesarean section  04/14/2012    Procedure: CESAREAN SECTION;  Surgeon: Osborne Oman, MD;  Location: [facility];  Service: Gynecology;  Laterality: N/A;  primary   Family History  Problem Relation Age of Onset  . Diabetes Paternal Grandmother   . Anesthesia problems Neg Hx    History  Substance Use Topics  . Smoking status: Former Smoker -- 0.25 packs/day for 2 years    Types: Cigarettes    Quit date: 04/15/2014  . Smokeless tobacco: Never Used   . Alcohol Use: Yes     Comment: only occasionally   OB History   Grav Para Term Preterm Abortions TAB SAB Ect Mult Living   3 3 0 3 0 0 0 0 1 4       Review of Systems  Neurological: Positive for headaches.  All other systems reviewed and are negative.    Allergies  Review of patient's allergies indicates no known allergies.  Home Medications   Prior to Admission medications   Medication Sig Start Date End Date Taking? Authorizing Provider  aspirin-acetaminophen-caffeine (EXCEDRIN MIGRAINE) [phone] MG per tablet Take 2 tablets by mouth every 6 (six) hours as needed (for pain.).   Yes Historical Provider, MD  Aspirin-Acetaminophen-Caffeine (GOODY HEADACHE PO) Take 1 packet by mouth every 6 (six) hours as needed (for pain.).   Yes Historical Provider, MD   BP 120/82  Pulse 81  Temp(Src) 98.2 F (36.8 C) (Oral)  Resp 18  Ht 5' 6"  (1.676 m)  Wt 237 lb (107.502 kg)  BMI 38.27 kg/m2  SpO2 100%  LMP 06/16/2014  Physical Exam  Nursing note and vitals reviewed. Constitutional: She is oriented to person, place, and time. She appears well-developed and well-nourished. No distress.  Nontoxic/nonseptic appearing  HENT:  Head: Normocephalic and atraumatic.  Eyes: Conjunctivae and EOM are normal. No scleral icterus.  Neck: Normal range of motion.  No nuchal rigidity or meningismus  Cardiovascular: Normal rate, regular rhythm and intact distal pulses.   Pulmonary/Chest: Effort normal. No respiratory distress.  Musculoskeletal: Normal range of motion.  Neurological: She  is alert and oriented to person, place, and time. No cranial nerve deficit. She exhibits normal muscle tone. Coordination normal.  GCS 15. Patient speaks in full goal oriented sentences. No cranial nerve deficits appreciated; symmetric eyebrow raise, no facial drooping, tongue midline. Patient has equal grip strength bilaterally with 5/5 strength against resistance in all extremities. No gross sensory deficits  noted on exam. Patient ambulates with normal gait. Finger to nose intact. No pronator drift.  Skin: Skin is warm and dry. No rash noted. She is not diaphoretic. No erythema. No pallor.  Psychiatric: She has a normal mood and affect. Her behavior is normal.    ED Course  Procedures (including critical care time) Labs Review Labs Reviewed - No data to display  Imaging Review No results found.   EKG Interpretation None      MDM   Final diagnoses:  Nonintractable headache, unspecified chronicity pattern, unspecified headache type    28 year old female presents to the Emergency Department for a persistent, intermittent headache x2 weeks. Patient denies any trauma or injury inciting her symptoms. She was seen for similar symptoms 10 days ago. Patient instructed to followup with neurology, but has been unable to do so secondary to lack of insurance. Patient today has a normal, nonfocal neurologic exam. No nuchal rigidity or meningismus to suggest meningitis. Patient afebrile today. No thunderclap onset. Doubt SAH or other emergent intracranial process given migratory nature of symptoms and persistence over 2 weeks.  Patient treated in ED with migraine cocktail. Patient signed out to [employer], PA-C at shift change. [place], PA-C to follow patient and disposition appropriately. Anticipate discharge when symptoms better controlled with instruction to followup with neurology and take naproxen for breakthrough pain.   Filed Vitals:   07/14/14 0134 07/14/14 0511  BP: 131/92 120/82  Pulse: 89 81  Temp: 98.6 F (37 C) 98.2 F (36.8 C)  TempSrc: Oral Oral  Resp: 18 18  Height: 5' 6"  (1.676 m)   Weight: 237 lb (107.502 kg)   SpO2: 100% 100%     Antonietta Breach, PA-C 07/14/14 [phone]

## 2014-07-14 NOTE — ED Provider Notes (Signed)
7:31 AM Pt signed out to me at shift change by PA Humes. Pt with headache for 2 wks, bitemporal/frontal. Gradual in onset. She has been seen in ED for this once, referred to neurology but has not been seen yet. Pt was treated with migraine cocktail today, and signed out pending reassessment.   She states her pain has resolved. She is in no distress. Ambulatory. Ready for dc home. Will follow up outpatient.   Filed Vitals:   07/14/14 0134 07/14/14 0511 07/14/14 0732  BP: 131/92 120/82 124/79  Pulse: 89 81 88  Temp: 98.6 F (37 C) 98.2 F (36.8 C)   TempSrc: Oral Oral   Resp: 18 18 18   Height: 5' 6"  (1.676 m)    Weight: 237 lb (107.502 kg)    SpO2: 100% 100% 100%     Renold Genta, PA-C 07/14/14 [phone]

## 2014-07-14 NOTE — Discharge Instructions (Signed)
Take Excedrin migraine for your headache. Follow up with primary care doctor or neurology for further treatment.    General Headache Without Cause A headache is pain or discomfort felt around the head or neck area. The specific cause of a headache may not be found. There are many causes and types of headaches. A few common ones are:  Tension headaches.  Migraine headaches.  Cluster headaches.  Chronic daily headaches. HOME CARE INSTRUCTIONS   Keep all follow-up appointments with your caregiver or any specialist referral.  Only take over-the-counter or prescription medicines for pain or discomfort as directed by your caregiver.  Lie down in a dark, quiet room when you have a headache.  Keep a headache journal to find out what may trigger your migraine headaches. For example, write down:  What you eat and drink.  How much sleep you get.  Any change to your diet or medicines.  Try massage or other relaxation techniques.  Put ice packs or heat on the head and neck. Use these 3 to 4 times per day for 15 to 20 minutes each time, or as needed.  Limit stress.  Sit up straight, and do not tense your muscles.  Quit smoking if you smoke.  Limit alcohol use.  Decrease the amount of caffeine you drink, or stop drinking caffeine.  Eat and sleep on a regular schedule.  Get 7 to 9 hours of sleep, or as recommended by your caregiver.  Keep lights dim if bright lights bother you and make your headaches worse. SEEK MEDICAL CARE IF:   You have problems with the medicines you were prescribed.  Your medicines are not working.  You have a change from the usual headache.  You have nausea or vomiting. SEEK IMMEDIATE MEDICAL CARE IF:   Your headache becomes severe.  You have a fever.  You have a stiff neck.  You have loss of vision.  You have muscular weakness or loss of muscle control.  You start losing your balance or have trouble walking.  You feel faint or pass  out.  You have severe symptoms that are different from your first symptoms. MAKE SURE YOU:   Understand these instructions.  Will watch your condition.  Will get help right away if you are not doing well or get worse. Document Released: 11/18/2005 Document Revised: 02/10/2012 Document Reviewed: 12/04/2011 [facility] Patient Information 2015 [place], [place]. This information is not intended to replace advice given to you by your health care provider. Make sure you discuss any questions you have with your health care provider.

## 2014-07-14 NOTE — ED Notes (Signed)
Pt c/o headache x 2 weeks; pt states its a dull frontal headache that radiates to her temporal area; pt says sometimes its that rt temporal area and sometimes the left; pt c/o left temporal radiation today; pt c/o neck and shoulder muscle tenderness and nasal congestion; denies LOC; PERLA

## 2014-07-15 NOTE — ED Provider Notes (Signed)
Medical screening examination/treatment/procedure(s) were performed by non-physician practitioner and as supervising physician I was immediately available for consultation/collaboration.   EKG Interpretation None       Varney Biles, MD 07/15/14 0349

## 2014-07-15 NOTE — ED Provider Notes (Signed)
Medical screening examination/treatment/procedure(s) were performed by non-physician practitioner and as supervising physician I was immediately available for consultation/collaboration.   EKG Interpretation None       Varney Biles, MD 07/15/14 [phone]

## 2014-07-19 ENCOUNTER — Encounter (HOSPITAL_COMMUNITY): Payer: Self-pay | Admitting: Emergency Medicine

## 2014-07-19 ENCOUNTER — Emergency Department (HOSPITAL_COMMUNITY): Payer: 59

## 2014-07-19 ENCOUNTER — Emergency Department (HOSPITAL_COMMUNITY)
Admission: EM | Admit: 2014-07-19 | Discharge: 2014-07-19 | Disposition: A | Discharge status: Home / Self Care | Payer: 59 | Attending: Emergency Medicine | Admitting: Emergency Medicine

## 2014-07-19 DIAGNOSIS — R51 Headache: Secondary | ICD-10-CM | POA: Insufficient documentation

## 2014-07-19 DIAGNOSIS — J3489 Other specified disorders of nose and nasal sinuses: Secondary | ICD-10-CM | POA: Insufficient documentation

## 2014-07-19 DIAGNOSIS — Z87891 Personal history of nicotine dependence: Secondary | ICD-10-CM | POA: Insufficient documentation

## 2014-07-19 DIAGNOSIS — Z8719 Personal history of other diseases of the digestive system: Secondary | ICD-10-CM | POA: Insufficient documentation

## 2014-07-19 DIAGNOSIS — R519 Headache, unspecified: Secondary | ICD-10-CM

## 2014-07-19 DIAGNOSIS — R0982 Postnasal drip: Secondary | ICD-10-CM | POA: Insufficient documentation

## 2014-07-19 MED ORDER — SODIUM CHLORIDE 0.9 % IV BOLUS (SEPSIS)
1000.0000 mL | Freq: Once | INTRAVENOUS | Status: AC
  Administered 2014-07-19: 1000 mL via INTRAVENOUS

## 2014-07-19 MED ORDER — PROCHLORPERAZINE EDISYLATE 5 MG/ML IJ SOLN
10.0000 mg | Freq: Once | INTRAMUSCULAR | Status: AC
  Administered 2014-07-19: 10 mg via INTRAVENOUS
  Filled 2014-07-19: qty 2

## 2014-07-19 MED ORDER — FLUTICASONE PROPIONATE 50 MCG/ACT NA SUSP
2.0000 | Freq: Every day | NASAL | Status: DC
  Administered 2014-07-19: 2 via NASAL
  Filled 2014-07-19: qty 16

## 2014-07-19 MED ORDER — NAPROXEN 500 MG PO TABS: 500.0000 mg | ORAL_TABLET | Freq: Two times a day (BID) | ORAL | Status: DC

## 2014-07-19 MED ORDER — DIPHENHYDRAMINE HCL 50 MG/ML IJ SOLN
25.0000 mg | Freq: Once | INTRAMUSCULAR | Status: AC
  Administered 2014-07-19: 25 mg via INTRAVENOUS
  Filled 2014-07-19: qty 1

## 2014-07-19 NOTE — ED Provider Notes (Signed)
CSN: [id number]     Arrival date & time 07/19/14  0023 History   First MD Initiated Contact with Patient 07/19/14 0157     Chief Complaint  Patient presents with  . Nasal Congestion  . Headache    (Consider location/radiation/quality/duration/timing/severity/associated sxs/prior Treatment) HPI Comments: Patient is a 28 year old female with a history of Crohn's disease who presents to the emergency department for further evaluation of headache. This is the patient's third visit to the emergency department this month for similar symptoms. Patient states that her headache has continued to be intermittent, present every 1 to 2 days. She states it is associated with postnasal drip and nasal congestion. Onset is always gradual. Patient states that she took a Tylenol Sinus tablet with complete relief of symptoms. Headache then gradually recurred and she took a Sudafed and Goody powder with only partial improvement. Patient denies fever, photophobia, phonophobia, vision loss, hearing changes, difficulty speaking or swallowing, nausea or vomiting, neck pain or stiffness, numbness/tingling, extremity weakness. Patient is concerned that she has mold in her apartment causing her symptoms; no CP or SOB.  Patient is a 28 y.o. female presenting with headaches. The history is provided by the patient. No language interpreter was used.  Headache Associated symptoms: congestion and drainage   Associated symptoms: no fever, no nausea, no neck pain, no neck stiffness, no numbness, no photophobia and no vomiting     Past Medical History  Diagnosis Date  . Crohn's disease   . Abnormal Pap smear     ASCUS 2011   Past Surgical History  Procedure Laterality Date  . Small intestine surgery    . Cesarean section  04/14/2012    Procedure: CESAREAN SECTION;  Surgeon: Osborne Oman, MD;  Location: [facility];  Service: Gynecology;  Laterality: N/A;  primary   Family History  Problem Relation Age of Onset  . Diabetes  Paternal Grandmother   . Anesthesia problems Neg Hx    History  Substance Use Topics  . Smoking status: Former Smoker -- 0.25 packs/day for 2 years    Types: Cigarettes    Quit date: 04/15/2014  . Smokeless tobacco: Never Used  . Alcohol Use: Yes     Comment: only occasionally   OB History   Grav Para Term Preterm Abortions TAB SAB Ect Mult Living   3 3 0 3 0 0 0 0 1 4       Review of Systems  Constitutional: Negative for fever.  HENT: Positive for congestion and postnasal drip.   Eyes: Negative for photophobia.  Gastrointestinal: Negative for nausea and vomiting.  Musculoskeletal: Negative for neck pain and neck stiffness.  Neurological: Positive for headaches. Negative for syncope, weakness and numbness.  All other systems reviewed and are negative.    Allergies  Review of patient's allergies indicates no known allergies.  Home Medications   Prior to Admission medications   Medication Sig Start Date End Date Taking? Authorizing Provider  Aspirin-Acetaminophen-Caffeine (GOODY HEADACHE PO) Take 1 packet by mouth every 6 (six) hours as needed (for pain.).   Yes Historical Provider, MD  pseudoephedrine (SUDAFED) 30 MG tablet Take 30 mg by mouth every 4 (four) hours as needed for congestion.   Yes Historical Provider, MD  pseudoephedrine-acetaminophen (TYLENOL SINUS) 30-500 MG TABS Take 1 tablet by mouth every 4 (four) hours as needed (for congestion).   Yes Historical Provider, MD   BP 172/86  Pulse 73  Temp(Src) 98 F (36.7 C) (Oral)  Resp 18  SpO2 100%  LMP 06/16/2014  Physical Exam  Nursing note and vitals reviewed. Constitutional: She is oriented to person, place, and time. She appears well-developed and well-nourished. No distress.  Nontoxic/nonseptic appearing.  HENT:  Head: Normocephalic and atraumatic.  Eyes: Conjunctivae and EOM are normal. Pupils are equal, round, and reactive to light. No scleral icterus.  EOMs normal without nystagmus.  Neck: Normal  range of motion. Neck supple.  No nuchal rigidity or meningismus  Cardiovascular: Normal rate, regular rhythm and intact distal pulses.   Distal radial pulses 2+ b/l  Pulmonary/Chest: Effort normal. No respiratory distress.  Musculoskeletal: Normal range of motion.  Neurological: She is alert and oriented to person, place, and time. She has normal reflexes. No cranial nerve deficit. She exhibits normal muscle tone. Coordination normal. GCS eye subscore is 4. GCS verbal subscore is 5. GCS motor subscore is 6.  Reflex Scores:      Patellar reflexes are 2+ on the right side and 2+ on the left side.      Achilles reflexes are 2+ on the right side and 2+ on the left side. GCS 15. Patient speaks in full goal oriented sentences. No cranial nerve deficits appreciated; symmetric eyebrow raise, no facial drooping, tongue midline. Patient has equal grip strength bilaterally with 5/5 strength against resistance in all extremities. No gross sensory deficits noted on exam. Patient ambulates with normal gait. She moves her extremities without ataxia. No pronator drift.  Skin: Skin is warm and dry. No rash noted. She is not diaphoretic. No erythema. No pallor.  Psychiatric: She has a normal mood and affect. Her behavior is normal.    ED Course  Procedures (including critical care time) Labs Review Labs Reviewed - No data to display  Imaging Review Ct Head Wo Contrast  07/19/2014   CLINICAL DATA:  Headache  EXAM: CT HEAD WITHOUT CONTRAST  TECHNIQUE: Contiguous axial images were obtained from the base of the skull through the vertex without intravenous contrast.  COMPARISON:  None.  FINDINGS: There is no acute intracranial hemorrhage or infarct. No mass lesion or midline shift. Gray-white matter differentiation is well maintained. Ventricles are normal in size without evidence of hydrocephalus. CSF containing spaces are within normal limits. No extra-axial fluid collection.  The calvarium is intact.  Orbital  soft tissues are within normal limits.  The paranasal sinuses and mastoid air cells are well pneumatized and free of fluid.  Scalp soft tissues are unremarkable.  IMPRESSION: Normal head CT with no acute intracranial process identified. No evidence of active sinus infection.   Electronically Signed   By: Jeannine Boga M.D.   On: 07/19/2014 02:45     EKG Interpretation None      MDM   Final diagnoses:  Headache, unspecified headache type    28 year old female presents to the emergency department for the third time in one month for persistent headache. Symptoms have been intermittent and gradual in onset. Patient states that symptoms have been associated with postnasal drip. Patient has yet to followup with allergy as instructed. She states that she is filing for insurance this week.  Today patient's neurologic exam is nonfocal. She has no fever, nuchal rigidity, or meningismus to suggest meningitis. CT head ordered for further evaluation of symptoms given that this is her third visit for similar complaints. CT scan today shows no acute intracranial process.  Patient treated in ED with Compazine and Benadryl with complete resolution in her headache. Patient is stable for discharge with prescription for naproxen to take for  breakthrough headache symptoms. Have again referred the patient to neurology for followup. Return precautions provided and patient agreeable to plan with no unaddressed concerns.     Antonietta Breach, [place] 07/19/14 [phone]

## 2014-07-19 NOTE — Discharge Instructions (Signed)
Get at least 8 hours of sleep in a quiet dark room. Take naproxen as needed for recurrent headache symptoms. Follow up with neurology.  Headaches, Frequently Asked Questions MIGRAINE HEADACHES Q: What is migraine? What causes it? How can I treat it? A: Generally, migraine headaches begin as a dull ache. Then they develop into a constant, throbbing, and pulsating pain. You may experience pain at the temples. You may experience pain at the front or back of one or both sides of the head. The pain is usually accompanied by a combination of:  Nausea.  Vomiting.  Sensitivity to light and noise. Some people (about 15%) experience an aura (see below) before an attack. The cause of migraine is believed to be chemical reactions in the brain. Treatment for migraine may include over-the-counter or prescription medications. It may also include self-help techniques. These include relaxation training and biofeedback.  Q: What is an aura? A: About 15% of people with migraine get an "aura". This is a sign of neurological symptoms that occur before a migraine headache. You may see wavy or jagged lines, dots, or flashing lights. You might experience tunnel vision or blind spots in one or both eyes. The aura can include visual or auditory hallucinations (something imagined). It may include disruptions in smell (such as strange odors), taste or touch. Other symptoms include:  Numbness.  A "pins and needles" sensation.  Difficulty in recalling or speaking the correct word. These neurological events may last as long as 60 minutes. These symptoms will fade as the headache begins. Q: What is a trigger? A: Certain physical or environmental factors can lead to or "trigger" a migraine. These include:  Foods.  Hormonal changes.  Weather.  Stress. It is important to remember that triggers are different for everyone. To help prevent migraine attacks, you need to figure out which triggers affect you. Keep a headache  diary. This is a good way to track triggers. The diary will help you talk to your healthcare professional about your condition. Q: Does weather affect migraines? A: Bright sunshine, hot, humid conditions, and drastic changes in barometric pressure may lead to, or "trigger," a migraine attack in some people. But studies have shown that weather does not act as a trigger for everyone with migraines. Q: What is the link between migraine and hormones? A: Hormones start and regulate many of your body's functions. Hormones keep your body in balance within a constantly changing environment. The levels of hormones in your body are unbalanced at times. Examples are during menstruation, pregnancy, or menopause. That can lead to a migraine attack. In fact, about three quarters of all women with migraine report that their attacks are related to the menstrual cycle.  Q: Is there an increased risk of stroke for migraine sufferers? A: The likelihood of a migraine attack causing a stroke is very remote. That is not to say that migraine sufferers cannot have a stroke associated with their migraines. In persons under age 37, the most common associated factor for stroke is migraine headache. But over the course of a person's normal life span, the occurrence of migraine headache may actually be associated with a reduced risk of dying from cerebrovascular disease due to stroke.  Q: What are acute medications for migraine? A: Acute medications are used to treat the pain of the headache after it has started. Examples over-the-counter medications, NSAIDs, ergots, and triptans.  Q: What are the triptans? A: Triptans are the newest class of abortive medications. They are specifically  targeted to treat migraine. Triptans are vasoconstrictors. They moderate some chemical reactions in the brain. The triptans work on receptors in your brain. Triptans help to restore the balance of a neurotransmitter called serotonin. Fluctuations in  levels of serotonin are thought to be a main cause of migraine.  Q: Are over-the-counter medications for migraine effective? A: Over-the-counter, or "OTC," medications may be effective in relieving mild to moderate pain and associated symptoms of migraine. But you should see your caregiver before beginning any treatment regimen for migraine.  Q: What are preventive medications for migraine? A: Preventive medications for migraine are sometimes referred to as "prophylactic" treatments. They are used to reduce the frequency, severity, and length of migraine attacks. Examples of preventive medications include antiepileptic medications, antidepressants, beta-blockers, calcium channel blockers, and NSAIDs (nonsteroidal anti-inflammatory drugs). Q: Why are anticonvulsants used to treat migraine? A: During the past few years, there has been an increased interest in antiepileptic drugs for the prevention of migraine. They are sometimes referred to as "anticonvulsants". Both epilepsy and migraine may be caused by similar reactions in the brain.  Q: Why are antidepressants used to treat migraine? A: Antidepressants are typically used to treat people with depression. They may reduce migraine frequency by regulating chemical levels, such as serotonin, in the brain.  Q: What alternative therapies are used to treat migraine? A: The term "alternative therapies" is often used to describe treatments considered outside the scope of conventional Western medicine. Examples of alternative therapy include acupuncture, acupressure, and yoga. Another common alternative treatment is herbal therapy. Some herbs are believed to relieve headache pain. Always discuss alternative therapies with your caregiver before proceeding. Some herbal products contain arsenic and other toxins. TENSION HEADACHES Q: What is a tension-type headache? What causes it? How can I treat it? A: Tension-type headaches occur randomly. They are often the  result of temporary stress, anxiety, fatigue, or anger. Symptoms include soreness in your temples, a tightening band-like sensation around your head (a "vice-like" ache). Symptoms can also include a pulling feeling, pressure sensations, and contracting head and neck muscles. The headache begins in your forehead, temples, or the back of your head and neck. Treatment for tension-type headache may include over-the-counter or prescription medications. Treatment may also include self-help techniques such as relaxation training and biofeedback. CLUSTER HEADACHES Q: What is a cluster headache? What causes it? How can I treat it? A: Cluster headache gets its name because the attacks come in groups. The pain arrives with little, if any, warning. It is usually on one side of the head. A tearing or bloodshot eye and a runny nose on the same side of the headache may also accompany the pain. Cluster headaches are believed to be caused by chemical reactions in the brain. They have been described as the most severe and intense of any headache type. Treatment for cluster headache includes prescription medication and oxygen. SINUS HEADACHES Q: What is a sinus headache? What causes it? How can I treat it? A: When a cavity in the bones of the face and skull (a sinus) becomes inflamed, the inflammation will cause localized pain. This condition is usually the result of an allergic reaction, a tumor, or an infection. If your headache is caused by a sinus blockage, such as an infection, you will probably have a fever. An x-ray will confirm a sinus blockage. Your caregiver's treatment might include antibiotics for the infection, as well as antihistamines or decongestants.  REBOUND HEADACHES Q: What is a rebound headache? What causes  it? How can I treat it? A: A pattern of taking acute headache medications too often can lead to a condition known as "rebound headache." A pattern of taking too much headache medication includes taking  it more than 2 days per week or in excessive amounts. That means more than the label or a caregiver advises. With rebound headaches, your medications not only stop relieving pain, they actually begin to cause headaches. Doctors treat rebound headache by tapering the medication that is being overused. Sometimes your caregiver will gradually substitute a different type of treatment or medication. Stopping may be a challenge. Regularly overusing a medication increases the potential for serious side effects. Consult a caregiver if you regularly use headache medications more than 2 days per week or more than the label advises. ADDITIONAL QUESTIONS AND ANSWERS Q: What is biofeedback? A: Biofeedback is a self-help treatment. Biofeedback uses special equipment to monitor your body's involuntary physical responses. Biofeedback monitors:  Breathing.  Pulse.  Heart rate.  Temperature.  Muscle tension.  Brain activity. Biofeedback helps you refine and perfect your relaxation exercises. You learn to control the physical responses that are related to stress. Once the technique has been mastered, you do not need the equipment any more. Q: Are headaches hereditary? A: Four out of five (80%) of people that suffer report a family history of migraine. Scientists are not sure if this is genetic or a family predisposition. Despite the uncertainty, a child has a 50% chance of having migraine if one parent suffers. The child has a 75% chance if both parents suffer.  Q: Can children get headaches? A: By the time they reach high school, most young people have experienced some type of headache. Many safe and effective approaches or medications can prevent a headache from occurring or stop it after it has begun.  Q: What type of doctor should I see to diagnose and treat my headache? A: Start with your primary caregiver. Discuss his or her experience and approach to headaches. Discuss methods of classification, diagnosis,  and treatment. Your caregiver may decide to recommend you to a headache specialist, depending upon your symptoms or other physical conditions. Having diabetes, allergies, etc., may require a more comprehensive and inclusive approach to your headache. The National Headache Foundation will provide, upon request, a list of [facility] physician members in your state. Document Released: 02/08/2004 Document Revised: 02/10/2012 Document Reviewed: 07/18/2008 [facility] Patient Information 2015 [place], [place]. This information is not intended to replace advice given to you by your health care provider. Make sure you discuss any questions you have with your health care provider.

## 2014-07-19 NOTE — ED Provider Notes (Signed)
Medical screening examination/treatment/procedure(s) were performed by non-physician practitioner and as supervising physician I was immediately available for consultation/collaboration.   EKG Interpretation None       Kalman Drape, MD 07/19/14 [phone]

## 2014-07-19 NOTE — ED Notes (Signed)
Patient here with complaint of sinus drainage and headache. Denies fever. Patient states she has been seen twice for this headache. Review of chart shows two visits this months. Patient states the headache medications helped the pain at that time, but it quickly returned after the meds wore off. States that she thinks she has a sinus infection. Denies abnormal mucous production.

## 2014-07-25 ENCOUNTER — Encounter: Payer: Self-pay | Admitting: Internal Medicine

## 2014-07-25 ENCOUNTER — Ambulatory Visit: Payer: Self-pay | Attending: Internal Medicine | Admitting: Internal Medicine

## 2014-07-25 VITALS — BP 112/74 | HR 98 | Temp 98.2°F | Resp 18 | Ht 66.0 in | Wt 230.8 lb

## 2014-07-25 DIAGNOSIS — R51 Headache: Secondary | ICD-10-CM | POA: Insufficient documentation

## 2014-07-25 DIAGNOSIS — R42 Dizziness and giddiness: Secondary | ICD-10-CM | POA: Insufficient documentation

## 2014-07-25 DIAGNOSIS — R519 Headache, unspecified: Secondary | ICD-10-CM

## 2014-07-25 DIAGNOSIS — J309 Allergic rhinitis, unspecified: Secondary | ICD-10-CM | POA: Insufficient documentation

## 2014-07-25 DIAGNOSIS — J302 Other seasonal allergic rhinitis: Secondary | ICD-10-CM

## 2014-07-25 DIAGNOSIS — Z79899 Other long term (current) drug therapy: Secondary | ICD-10-CM | POA: Insufficient documentation

## 2014-07-25 DIAGNOSIS — K5090 Crohn's disease, unspecified, without complications: Secondary | ICD-10-CM | POA: Insufficient documentation

## 2014-07-25 DIAGNOSIS — Z7982 Long term (current) use of aspirin: Secondary | ICD-10-CM | POA: Insufficient documentation

## 2014-07-25 DIAGNOSIS — Z87891 Personal history of nicotine dependence: Secondary | ICD-10-CM | POA: Insufficient documentation

## 2014-07-25 DIAGNOSIS — M62838 Other muscle spasm: Secondary | ICD-10-CM | POA: Insufficient documentation

## 2014-07-25 MED ORDER — LORATADINE 10 MG PO TABS: 10.0000 mg | ORAL_TABLET | Freq: Every day | ORAL | Status: DC

## 2014-07-25 MED ORDER — CYCLOBENZAPRINE HCL 10 MG PO TABS: 10.0000 mg | ORAL_TABLET | Freq: Two times a day (BID) | ORAL | Status: DC | PRN

## 2014-07-25 NOTE — Patient Instructions (Signed)

## 2014-07-25 NOTE — Progress Notes (Signed)
Patient ID: Lauren Ramirez, female   DOB: July 22, 1986, 28 y.o.   MRN: [id number]  VCB:[id number]  [id number]:[id number]  DOB - 05/11/1986  CC:  Chief Complaint  Patient presents with  . Headache  . Dizziness  . Establish Care       HPI: Lauren Ramirez is a 28 y.o. female here today to establish medical care.  Patient has been to ER several times for frequent headaches. She states that she had a CT of her head and it was normal.  She believes the headaches could be caused from mold in her apartment.  She reports that she left her apartment for a few days and has not had a headache since.  The pain is described as pressure or a rolling sensation in her bilateral temporal regions and frontal lobe.  She denies nausea, vomiting, photosensitivity,or photophobia.  She c/o of sinus pressure and postnasal drip.    No Known Allergies Past Medical History  Diagnosis Date  . Crohn's disease   . Abnormal Pap smear     ASCUS 2011   Current Outpatient Prescriptions on File Prior to Visit  Medication Sig Dispense Refill  . Aspirin-Acetaminophen-Caffeine (GOODY HEADACHE PO) Take 1 packet by mouth every 6 (six) hours as needed (for pain.).      Marland Kitchen naproxen (NAPROSYN) 500 MG tablet Take 1 tablet (500 mg total) by mouth 2 (two) times daily.  30 tablet  0  . pseudoephedrine (SUDAFED) 30 MG tablet Take 30 mg by mouth every 4 (four) hours as needed for congestion.      . pseudoephedrine-acetaminophen (TYLENOL SINUS) 30-500 MG TABS Take 1 tablet by mouth every 4 (four) hours as needed (for congestion).       No current facility-administered medications on file prior to visit.   Family History  Problem Relation Age of Onset  . Diabetes Paternal Grandmother   . Anesthesia problems Neg Hx   . Hypertension Mother    History   Social History  . Marital Status: Single    Spouse Name: N/A    Number of Children: N/A  . Years of Education: N/A   Occupational History  . Not on file.   Social History Main  Topics  . Smoking status: Former Smoker -- 0.25 packs/day for 2 years    Types: Cigarettes    Quit date: 04/15/2014  . Smokeless tobacco: Never Used  . Alcohol Use: Yes     Comment: only occasionally  . Drug Use: No  . Sexual Activity: Not Currently    Birth Control/ Protection: Implant   Other Topics Concern  . Not on file   Social History Narrative  . No narrative on file   Review of Systems  Constitutional: Negative for fever and chills.  HENT:       Post nasal drip and congestion   Eyes: Positive for pain and discharge. Negative for blurred vision, double vision, photophobia and redness.  Gastrointestinal: Negative for nausea, vomiting and abdominal pain.  Genitourinary: Negative.   Musculoskeletal: Negative.   Neurological: Positive for dizziness and headaches. Negative for tingling and tremors.      Objective:   Filed Vitals:   07/25/14 1021  BP: 112/74  Pulse: 98  Temp: 98.2 F (36.8 C)  Resp: 18   Physical Exam  Constitutional: She is oriented to person, place, and time. No distress.  HENT:  Right Ear: External ear normal.  Left Ear: External ear normal.  Mouth/Throat: Oropharynx is clear and moist.  Negative for  sinus tenderness  Eyes: Conjunctivae and EOM are normal. Pupils are equal, round, and reactive to light.  Neck: Normal range of motion. Neck supple. No thyromegaly present.  Cardiovascular: Normal rate, regular rhythm and normal heart sounds.   Pulmonary/Chest: Effort normal and breath sounds normal.  Abdominal: Soft. Bowel sounds are normal.  Musculoskeletal: Normal range of motion.  Neurological: She is alert and oriented to person, place, and time. A cranial nerve deficit is present.  Skin: Skin is warm and dry. She is not diaphoretic.     Lab Results  Component Value Date   WBC 9.3 07/04/2014   HGB 12.6 07/04/2014   HCT 38.2 07/04/2014   MCV 84.1 07/04/2014   PLT 370 07/04/2014   Lab Results  Component Value Date   CREATININE 0.74  07/04/2014   BUN 7 07/04/2014   NA 137 07/04/2014   K 3.9 07/04/2014   CL 102 07/04/2014   CO2 22 07/04/2014    No results found for this basename: HGBA1C   Lipid Panel  No results found for this basename: chol, trig, hdl, cholhdl, vldl, ldlcalc       Assessment and plan:   Lauren Ramirez was seen today for headache, dizziness and establish care.  Diagnoses and associated orders for this visit:  Frequent headaches - cyclobenzaprine (FLEXERIL) 10 MG tablet; Take 1 tablet (10 mg total) by mouth 2 (two) times daily as needed for muscle spasms.  Seasonal allergies - loratadine (CLARITIN) 10 MG tablet; Take 1 tablet (10 mg total) by mouth daily.     Return if symptoms worsen or fail to improve.  The patient was given clear instructions to go to ER or return to medical center if symptoms don't improve, worsen or new problems develop. The patient verbalized understanding.   Chari Manning, [facility] [phone] 07/25/2014, 10:32 AM

## 2014-07-25 NOTE — Progress Notes (Signed)
HFU dizziness headache, facial spasm mainly lt side no pain  Establish care

## 2014-08-11 ENCOUNTER — Encounter (HOSPITAL_COMMUNITY): Payer: Self-pay | Admitting: Emergency Medicine

## 2014-08-11 ENCOUNTER — Emergency Department (INDEPENDENT_AMBULATORY_CARE_PROVIDER_SITE_OTHER)
Admission: EM | Admit: 2014-08-11 | Discharge: 2014-08-11 | Disposition: A | Discharge status: Home / Self Care | Payer: Self-pay | Source: Home / Self Care | Attending: Family Medicine | Admitting: Family Medicine

## 2014-08-11 DIAGNOSIS — M542 Cervicalgia: Secondary | ICD-10-CM

## 2014-08-11 DIAGNOSIS — G44209 Tension-type headache, unspecified, not intractable: Secondary | ICD-10-CM

## 2014-08-11 MED ORDER — IBUPROFEN 800 MG PO TABS: 800.0000 mg | ORAL_TABLET | Freq: Three times a day (TID) | ORAL | Status: DC

## 2014-08-11 NOTE — ED Provider Notes (Signed)
Lauren Ramirez is a 28 y.o. female who presents to Urgent Care today for headache and neck pain. Patient awoke this morning with mild right-sided headache and mild temporal swelling associated with bilateral trapezius pain. She's tried some ibuprofen which helps. No fevers or chills nausea vomiting or diarrhea. No weakness or numbness or difficulty walking. No blurry vision or loss of function. She feels well otherwise.   Past Medical History  Diagnosis Date  . Crohn's disease   . Abnormal Pap smear     ASCUS 2011   History  Substance Use Topics  . Smoking status: Former Smoker -- 0.25 packs/day for 2 years    Types: Cigarettes    Quit date: 04/15/2014  . Smokeless tobacco: Never Used  . Alcohol Use: Yes     Comment: only occasionally   ROS as above Medications: No current facility-administered medications for this encounter.   Current Outpatient Prescriptions  Medication Sig Dispense Refill  . Aspirin-Acetaminophen-Caffeine (GOODY HEADACHE PO) Take 1 packet by mouth every 6 (six) hours as needed (for pain.).      Marland Kitchen cyclobenzaprine (FLEXERIL) 10 MG tablet Take 1 tablet (10 mg total) by mouth 2 (two) times daily as needed for muscle spasms.  30 tablet  1  . ibuprofen (ADVIL,MOTRIN) 800 MG tablet Take 1 tablet (800 mg total) by mouth 3 (three) times daily.  30 tablet  0  . loratadine (CLARITIN) 10 MG tablet Take 1 tablet (10 mg total) by mouth daily.  30 tablet  11  . naproxen (NAPROSYN) 500 MG tablet Take 1 tablet (500 mg total) by mouth 2 (two) times daily.  30 tablet  0  . pseudoephedrine (SUDAFED) 30 MG tablet Take 30 mg by mouth every 4 (four) hours as needed for congestion.      . pseudoephedrine-acetaminophen (TYLENOL SINUS) 30-500 MG TABS Take 1 tablet by mouth every 4 (four) hours as needed (for congestion).        Exam:  BP 135/88  Pulse 80  Temp(Src) 98.4 F (36.9 C) (Oral)  Resp 18  SpO2 100%  LMP 07/02/2014 Gen: Well NAD HEENT: EOMI,  MMM no facial swelling  visible. No facial tenderness. Lungs: Normal work of breathing. CTABL Heart: RRR no MRG Abd: NABS, Soft. Nondistended, Nontender Exts: Brisk capillary refill, warm and well perfused.  Neck: Normal neck range of motion. Mildly tender bilateral trapezius. Flatus process. Upper extremity strength and reflexes are normal and equal bilaterally. Sensation is intact throughout  No results found for this or any previous visit (from the past 24 hour(s)). No results found.  Assessment and Plan: 28 y.o. female with headache and neck pain. Myofascial pain and tension type headache most likely. Plan to treat with ibuprofen watchful waiting and followup with primary care provider as needed.  Discussed warning signs or symptoms. Please see discharge instructions. Patient expresses understanding.   This note was created using Systems analyst. Any transcription errors are unintended.    Gregor Hams, MD 08/11/14 [phone]

## 2014-08-11 NOTE — Discharge Instructions (Signed)
Thank you for coming in today. Come back or go to the emergency room if you notice new weakness new numbness problems walking or bowel or bladder problems. Go to the emergency room if your headache becomes excruciating or you have weakness or numbness or uncontrolled vomiting.  Take ibuprofen as needed  Headaches, Frequently Asked Questions MIGRAINE HEADACHES Q: What is migraine? What causes it? How can I treat it? A: Generally, migraine headaches begin as a dull ache. Then they develop into a constant, throbbing, and pulsating pain. You may experience pain at the temples. You may experience pain at the front or back of one or both sides of the head. The pain is usually accompanied by a combination of:  Nausea.  Vomiting.  Sensitivity to light and noise. Some people (about 15%) experience an aura (see below) before an attack. The cause of migraine is believed to be chemical reactions in the brain. Treatment for migraine may include over-the-counter or prescription medications. It may also include self-help techniques. These include relaxation training and biofeedback.  Q: What is an aura? A: About 15% of people with migraine get an "aura". This is a sign of neurological symptoms that occur before a migraine headache. You may see wavy or jagged lines, dots, or flashing lights. You might experience tunnel vision or blind spots in one or both eyes. The aura can include visual or auditory hallucinations (something imagined). It may include disruptions in smell (such as strange odors), taste or touch. Other symptoms include:  Numbness.  A "pins and needles" sensation.  Difficulty in recalling or speaking the correct word. These neurological events may last as long as 60 minutes. These symptoms will fade as the headache begins. Q: What is a trigger? A: Certain physical or environmental factors can lead to or "trigger" a migraine. These include:  Foods.  Hormonal  changes.  Weather.  Stress. It is important to remember that triggers are different for everyone. To help prevent migraine attacks, you need to figure out which triggers affect you. Keep a headache diary. This is a good way to track triggers. The diary will help you talk to your healthcare professional about your condition. Q: Does weather affect migraines? A: Bright sunshine, hot, humid conditions, and drastic changes in barometric pressure may lead to, or "trigger," a migraine attack in some people. But studies have shown that weather does not act as a trigger for everyone with migraines. Q: What is the link between migraine and hormones? A: Hormones start and regulate many of your body's functions. Hormones keep your body in balance within a constantly changing environment. The levels of hormones in your body are unbalanced at times. Examples are during menstruation, pregnancy, or menopause. That can lead to a migraine attack. In fact, about three quarters of all women with migraine report that their attacks are related to the menstrual cycle.  Q: Is there an increased risk of stroke for migraine sufferers? A: The likelihood of a migraine attack causing a stroke is very remote. That is not to say that migraine sufferers cannot have a stroke associated with their migraines. In persons under age 49, the most common associated factor for stroke is migraine headache. But over the course of a person's normal life span, the occurrence of migraine headache may actually be associated with a reduced risk of dying from cerebrovascular disease due to stroke.  Q: What are acute medications for migraine? A: Acute medications are used to treat the pain of the headache after  it has started. Examples over-the-counter medications, NSAIDs, ergots, and triptans.  Q: What are the triptans? A: Triptans are the newest class of abortive medications. They are specifically targeted to treat migraine. Triptans are  vasoconstrictors. They moderate some chemical reactions in the brain. The triptans work on receptors in your brain. Triptans help to restore the balance of a neurotransmitter called serotonin. Fluctuations in levels of serotonin are thought to be a main cause of migraine.  Q: Are over-the-counter medications for migraine effective? A: Over-the-counter, or "OTC," medications may be effective in relieving mild to moderate pain and associated symptoms of migraine. But you should see your caregiver before beginning any treatment regimen for migraine.  Q: What are preventive medications for migraine? A: Preventive medications for migraine are sometimes referred to as "prophylactic" treatments. They are used to reduce the frequency, severity, and length of migraine attacks. Examples of preventive medications include antiepileptic medications, antidepressants, beta-blockers, calcium channel blockers, and NSAIDs (nonsteroidal anti-inflammatory drugs). Q: Why are anticonvulsants used to treat migraine? A: During the past few years, there has been an increased interest in antiepileptic drugs for the prevention of migraine. They are sometimes referred to as "anticonvulsants". Both epilepsy and migraine may be caused by similar reactions in the brain.  Q: Why are antidepressants used to treat migraine? A: Antidepressants are typically used to treat people with depression. They may reduce migraine frequency by regulating chemical levels, such as serotonin, in the brain.  Q: What alternative therapies are used to treat migraine? A: The term "alternative therapies" is often used to describe treatments considered outside the scope of conventional Western medicine. Examples of alternative therapy include acupuncture, acupressure, and yoga. Another common alternative treatment is herbal therapy. Some herbs are believed to relieve headache pain. Always discuss alternative therapies with your caregiver before proceeding. Some  herbal products contain arsenic and other toxins. TENSION HEADACHES Q: What is a tension-type headache? What causes it? How can I treat it? A: Tension-type headaches occur randomly. They are often the result of temporary stress, anxiety, fatigue, or anger. Symptoms include soreness in your temples, a tightening band-like sensation around your head (a "vice-like" ache). Symptoms can also include a pulling feeling, pressure sensations, and contracting head and neck muscles. The headache begins in your forehead, temples, or the back of your head and neck. Treatment for tension-type headache may include over-the-counter or prescription medications. Treatment may also include self-help techniques such as relaxation training and biofeedback. CLUSTER HEADACHES Q: What is a cluster headache? What causes it? How can I treat it? A: Cluster headache gets its name because the attacks come in groups. The pain arrives with little, if any, warning. It is usually on one side of the head. A tearing or bloodshot eye and a runny nose on the same side of the headache may also accompany the pain. Cluster headaches are believed to be caused by chemical reactions in the brain. They have been described as the most severe and intense of any headache type. Treatment for cluster headache includes prescription medication and oxygen. SINUS HEADACHES Q: What is a sinus headache? What causes it? How can I treat it? A: When a cavity in the bones of the face and skull (a sinus) becomes inflamed, the inflammation will cause localized pain. This condition is usually the result of an allergic reaction, a tumor, or an infection. If your headache is caused by a sinus blockage, such as an infection, you will probably have a fever. An x-ray will confirm a  sinus blockage. Your caregiver's treatment might include antibiotics for the infection, as well as antihistamines or decongestants.  REBOUND HEADACHES Q: What is a rebound headache? What  causes it? How can I treat it? A: A pattern of taking acute headache medications too often can lead to a condition known as "rebound headache." A pattern of taking too much headache medication includes taking it more than 2 days per week or in excessive amounts. That means more than the label or a caregiver advises. With rebound headaches, your medications not only stop relieving pain, they actually begin to cause headaches. Doctors treat rebound headache by tapering the medication that is being overused. Sometimes your caregiver will gradually substitute a different type of treatment or medication. Stopping may be a challenge. Regularly overusing a medication increases the potential for serious side effects. Consult a caregiver if you regularly use headache medications more than 2 days per week or more than the label advises. ADDITIONAL QUESTIONS AND ANSWERS Q: What is biofeedback? A: Biofeedback is a self-help treatment. Biofeedback uses special equipment to monitor your body's involuntary physical responses. Biofeedback monitors:  Breathing.  Pulse.  Heart rate.  Temperature.  Muscle tension.  Brain activity. Biofeedback helps you refine and perfect your relaxation exercises. You learn to control the physical responses that are related to stress. Once the technique has been mastered, you do not need the equipment any more. Q: Are headaches hereditary? A: Four out of five (80%) of people that suffer report a family history of migraine. Scientists are not sure if this is genetic or a family predisposition. Despite the uncertainty, a child has a 50% chance of having migraine if one parent suffers. The child has a 75% chance if both parents suffer.  Q: Can children get headaches? A: By the time they reach high school, most young people have experienced some type of headache. Many safe and effective approaches or medications can prevent a headache from occurring or stop it after it has begun.  Q:  What type of doctor should I see to diagnose and treat my headache? A: Start with your primary caregiver. Discuss his or her experience and approach to headaches. Discuss methods of classification, diagnosis, and treatment. Your caregiver may decide to recommend you to a headache specialist, depending upon your symptoms or other physical conditions. Having diabetes, allergies, etc., may require a more comprehensive and inclusive approach to your headache. The National Headache Foundation will provide, upon request, a list of [facility] physician members in your state. Document Released: 02/08/2004 Document Revised: 02/10/2012 Document Reviewed: 07/18/2008 [facility] Patient Information 2015 [place], [place]. This information is not intended to replace advice given to you by your health care provider. Make sure you discuss any questions you have with your health care provider.

## 2014-08-11 NOTE — ED Notes (Signed)
C/o  Right sided head pressure and swelling. Bilateral shoulder swelling and pain that radiates the back of the neck on set this a.m.  Mild relief with ibuprofen.  Denies injury.

## 2014-08-30 ENCOUNTER — Ambulatory Visit: Payer: Self-pay | Admitting: Internal Medicine

## 2014-09-06 ENCOUNTER — Ambulatory Visit: Payer: 59 | Attending: Internal Medicine | Admitting: Internal Medicine

## 2014-09-06 ENCOUNTER — Encounter: Payer: Self-pay | Admitting: Adult Health

## 2014-09-06 ENCOUNTER — Encounter: Payer: Self-pay | Admitting: Internal Medicine

## 2014-09-06 ENCOUNTER — Ambulatory Visit (INDEPENDENT_AMBULATORY_CARE_PROVIDER_SITE_OTHER): Payer: 59 | Admitting: Adult Health

## 2014-09-06 VITALS — BP 116/84 | HR 81 | Temp 97.7°F | Resp 16 | Ht 66.0 in | Wt 228.0 lb

## 2014-09-06 VITALS — BP 116/70 | Ht 66.0 in | Wt 227.5 lb

## 2014-09-06 DIAGNOSIS — M6289 Other specified disorders of muscle: Secondary | ICD-10-CM | POA: Diagnosis not present

## 2014-09-06 DIAGNOSIS — Z3009 Encounter for other general counseling and advice on contraception: Secondary | ICD-10-CM

## 2014-09-06 DIAGNOSIS — M7989 Other specified soft tissue disorders: Secondary | ICD-10-CM

## 2014-09-06 DIAGNOSIS — M542 Cervicalgia: Secondary | ICD-10-CM | POA: Insufficient documentation

## 2014-09-06 DIAGNOSIS — Z87891 Personal history of nicotine dependence: Secondary | ICD-10-CM | POA: Diagnosis not present

## 2014-09-06 DIAGNOSIS — Z975 Presence of (intrauterine) contraceptive device: Secondary | ICD-10-CM

## 2014-09-06 HISTORY — DX: Encounter for other general counseling and advice on contraception: Z30.09

## 2014-09-06 HISTORY — DX: Presence of (intrauterine) contraceptive device: Z97.5

## 2014-09-06 MED ORDER — IBUPROFEN 800 MG PO TABS: 800.0000 mg | ORAL_TABLET | Freq: Three times a day (TID) | ORAL | Status: DC

## 2014-09-06 MED ORDER — PREDNISONE 10 MG PO TABS: 10.0000 mg | ORAL_TABLET | Freq: Every day | ORAL | Status: DC

## 2014-09-06 NOTE — Progress Notes (Signed)
Subjective:     Patient ID: Lauren Ramirez, female   DOB: Jul 08, 1986, 28 y.o.   MRN: [id number]  HPI Lauren Ramirez is a 28 year old black female in for nexplanon removal and get new birth control but when asking her what she wanted she did not know.She says she thinks rod moves at times, has had about 2.5 years.  Review of Systems See HPI Reviewed past medical,surgical, social and family history. Reviewed medications and allergies.     Objective:   Physical Exam BP 116/70  Ht 5' 6"  (1.676 m)  Wt 227 lb 8 oz (103.193 kg)  BMI 36.74 kg/m2  LMP 09/01/2014   Talked with her about different types of birth control and she can't decide, will leave nexplanon in and give her info on birth control to review and make an informed choice, spent about 15 minutes discussing options, she denies migraines,DVTs or any cancer.She doesn't think she will remember the pill, and does not want weight gain with depo.Not sure about ring or IUD, and I don't think she is ready for BTL.Rod is easily palpated in left arm.  Assessment:     Nexplanon in place Contraceptive counseling and education    Plan:     Review handout on contraceptive options, then make a decision Return next week for pap and physical

## 2014-09-06 NOTE — Patient Instructions (Signed)
Review handout for contraception  Follow up next week for pap and physical

## 2014-09-06 NOTE — Progress Notes (Signed)
Pt is having pain and swelling in her left side of her neck and shoulders.

## 2014-09-06 NOTE — Patient Instructions (Signed)

## 2014-09-06 NOTE — Progress Notes (Signed)
Patient ID: Lauren Ramirez, female   DOB: 13-May-1986, 28 y.o.   MRN: [id number]  CC:  Neck pain  HPI:  Patient presents to clinic with concerns of neck swelling and pain.  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  Patient reports that she notices that her shoulders swell when she does more overhead lifting.  She states that the pain is achy in nature and feels like she has a pulling sensation when she turns her head to the left.  She states that the pain sometimes starts in her head and goes all the way to her neck.  She reports that she only took the flexeril prescribed last visit once because she did not like the way they made her feel.    No Known Allergies Past Medical History  Diagnosis Date  . Crohn's disease   . Abnormal Pap smear     ASCUS 2011  . Vaginal Pap smear, abnormal   . Nexplanon in place 09/06/2014  . General counseling and advice for contraceptive management 09/06/2014   Current Outpatient Prescriptions on File Prior to Visit  Medication Sig Dispense Refill  . ibuprofen (ADVIL,MOTRIN) 800 MG tablet Take 1 tablet (800 mg total) by mouth 3 (three) times daily.  30 tablet  0   No current facility-administered medications on file prior to visit.   Family History  Problem Relation Age of Onset  . Anesthesia problems Neg Hx   . Hypertension Mother   . Hypertension Maternal Grandmother   . Diabetes Maternal Grandmother   . Cancer Maternal Grandfather    History   Social History  . Marital Status: Single    Spouse Name: N/A    Number of Children: N/A  . Years of Education: N/A   Occupational History  . Not on file.   Social History Main Topics  . Smoking status: Former Smoker -- 0.25 packs/day for 2 years    Types: Cigarettes    Quit date: 04/15/2014  . Smokeless tobacco: Never Used  . Alcohol Use: Yes     Comment: only occasionally  . Drug Use: No  . Sexual Activity: Yes    Birth Control/ Protection: Implant   Other Topics  Concern  . Not on file   Social History Narrative  . No narrative on file    Review of Systems  Eyes: Negative for blurred vision.  Respiratory: Negative.   Cardiovascular: Negative.   Musculoskeletal: Positive for myalgias and neck pain.  Neurological: Positive for headaches. Negative for dizziness and tingling.     Objective:   Filed Vitals:   09/06/14 1700  BP: 116/84  Pulse: 81  Temp: 97.7 F (36.5 C)  Resp: 16    Physical Exam  Constitutional: She is oriented to person, place, and time.  Neck: Normal range of motion.  Cardiovascular: Normal rate, regular rhythm and normal heart sounds.   Pulmonary/Chest: Effort normal and breath sounds normal.  Musculoskeletal: Normal range of motion. She exhibits tenderness (with swelling of trapeizus muscle).  Neurological: She is alert and oriented to person, place, and time.     Lab Results  Component Value Date   WBC 9.3 07/04/2014   HGB 12.6 07/04/2014   HCT 38.2 07/04/2014   MCV 84.1 07/04/2014   PLT 370 07/04/2014   Lab Results  Component Value Date   CREATININE 0.74 07/04/2014   BUN 7 07/04/2014   NA 137 07/04/2014   K 3.9 07/04/2014   CL  102 07/04/2014   CO2 22 07/04/2014    No results found for this basename: HGBA1C   Lipid Panel  No results found for this basename: chol, trig, hdl, cholhdl, vldl, ldlcalc       Assessment and plan:   [place] was seen today for follow-up.  Diagnoses and associated orders for this visit:  Neck pain - ibuprofen (ADVIL,MOTRIN) 800 MG tablet; Take 1 tablet (800 mg total) by mouth 3 (three) times daily.  Muscle swelling - predniSONE (DELTASONE) 10 MG tablet; Take 1 tablet (10 mg total) by mouth daily with breakfast. Take 5 pills (50 mg) first day, 4 pills day 2, 3 pills on day 3, 2 pills on day 4, and one pill last day   Return if symptoms worsen or fail to improve.       Chari Manning, [facility] [phone] 09/06/2014, 5:16 PM

## 2014-09-14 ENCOUNTER — Encounter: Payer: Self-pay | Admitting: Obstetrics & Gynecology

## 2014-09-14 ENCOUNTER — Other Ambulatory Visit: Payer: 59 | Admitting: Obstetrics & Gynecology

## 2014-10-03 ENCOUNTER — Ambulatory Visit: Payer: 59 | Admitting: Adult Health

## 2014-10-03 ENCOUNTER — Encounter: Payer: Self-pay | Admitting: Internal Medicine

## 2014-10-10 ENCOUNTER — Other Ambulatory Visit: Payer: 59 | Admitting: Adult Health

## 2014-10-13 ENCOUNTER — Other Ambulatory Visit: Payer: 59 | Admitting: Adult Health

## 2014-10-21 ENCOUNTER — Other Ambulatory Visit: Payer: 59 | Admitting: Adult Health

## 2015-01-09 ENCOUNTER — Encounter (HOSPITAL_COMMUNITY): Payer: Self-pay | Admitting: *Deleted

## 2015-01-09 ENCOUNTER — Emergency Department (HOSPITAL_COMMUNITY)
Admission: EM | Admit: 2015-01-09 | Discharge: 2015-01-09 | Discharge status: Against Medical Advice | Payer: Self-pay | Attending: Emergency Medicine | Admitting: Emergency Medicine

## 2015-01-09 DIAGNOSIS — R51 Headache: Secondary | ICD-10-CM | POA: Insufficient documentation

## 2015-01-09 NOTE — ED Notes (Signed)
Per registration pt walked out the door and left at 0108.  Pt didn't tell anyone she was leaving.

## 2015-01-09 NOTE — ED Notes (Signed)
The pt had the lt side of her face than was tightening  approx 45 minutes ago .  She has had this numerus times in the past usually when she is voiding.  lmp  Last week

## 2015-01-26 ENCOUNTER — Encounter: Payer: Self-pay | Admitting: Internal Medicine

## 2015-01-26 ENCOUNTER — Ambulatory Visit: Payer: Self-pay | Attending: Internal Medicine | Admitting: Internal Medicine

## 2015-01-26 VITALS — BP 113/77 | HR 92 | Temp 98.1°F | Resp 16 | Ht 66.0 in | Wt 230.0 lb

## 2015-01-26 DIAGNOSIS — Z793 Long term (current) use of hormonal contraceptives: Secondary | ICD-10-CM | POA: Insufficient documentation

## 2015-01-26 DIAGNOSIS — Z87891 Personal history of nicotine dependence: Secondary | ICD-10-CM | POA: Insufficient documentation

## 2015-01-26 DIAGNOSIS — K5090 Crohn's disease, unspecified, without complications: Secondary | ICD-10-CM | POA: Insufficient documentation

## 2015-01-26 NOTE — Progress Notes (Signed)
Patient ID: Lauren Ramirez, female   DOB: October 10, 1986, 29 y.o.   MRN: [id number]  CC:  HPI: Lauren Ramirez is a 29 y.o. female here today for a follow up visit.  Patient has past medical history of Crohn disease.  She reports that she has been having a flare of her crohn's disease which is interferring with her job function.  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 She states that she has to defecate within minutes of any PO intake with pain.  She would like to have a work form filled out stating that she has a disorder and will require frequent breaks.  Patient has No headache, No chest pain, No Nausea, No new weakness tingling or numbness, No Cough - SOB.  No Known Allergies Past Medical History  Diagnosis Date  . Crohn's disease   . Abnormal Pap smear     ASCUS 2011  . Vaginal Pap smear, abnormal   . Nexplanon in place 09/06/2014  . General counseling and advice for contraceptive management 09/06/2014   Current Outpatient Prescriptions on File Prior to Visit  Medication Sig Dispense Refill  . etonogestrel (NEXPLANON) 68 MG IMPL implant Inject 1 each into the skin once.    Marland Kitchen ibuprofen (ADVIL,MOTRIN) 800 MG tablet Take 1 tablet (800 mg total) by mouth 3 (three) times daily. (Patient not taking: Reported on 01/26/2015) 30 tablet 0  . predniSONE (DELTASONE) 10 MG tablet Take 1 tablet (10 mg total) by mouth daily with breakfast. Take 5 pills (50 mg) first day, 4 pills day 2, 3 pills on day 3, 2 pills on day 4, and one pill last day (Patient not taking: Reported on 01/26/2015) 15 tablet 0   No current facility-administered medications on file prior to visit.   Family History  Problem Relation Age of Onset  . Anesthesia problems Neg Hx   . Hypertension Mother   . Hypertension Maternal Grandmother   . Diabetes Maternal Grandmother   . Cancer Maternal Grandfather    History   Social History  . Marital  Status: Single    Spouse Name: N/A  . Number of Children: N/A  . Years of Education: N/A   Occupational History  . Not on file.   Social History Main Topics  . Smoking status: Former Smoker -- 0.25 packs/day for 2 years    Types: Cigarettes    Quit date: 04/15/2014  . Smokeless tobacco: Never Used  . Alcohol Use: Yes     Comment: only occasionally  . Drug Use: No  . Sexual Activity: Yes    Birth Control/ Protection: Implant   Other Topics Concern  . Not on file   Social History Narrative    ,Review of Systems  Constitutional: Negative for fever, chills and weight loss.  Gastrointestinal: Positive for abdominal pain and diarrhea. Negative for nausea and vomiting.  All other systems reviewed and are negative.    Objective:   Filed Vitals:   01/26/15 1430  BP: 113/77  Pulse: 92  Temp: 98.1 F (36.7 C)  Resp: 16    Physical Exam  Cardiovascular: Normal rate, regular rhythm and normal heart sounds.   Pulmonary/Chest: Effort normal and breath sounds normal.  Abdominal: Soft. Bowel sounds are normal. She exhibits no distension. There is no tenderness.  Vitals reviewed.    Lab Results  Component Value Date   WBC 9.3 07/04/2014   HGB 12.6 07/04/2014  HCT 38.2 07/04/2014   MCV 84.1 07/04/2014   PLT 370 07/04/2014   Lab Results  Component Value Date   CREATININE 0.74 07/04/2014   BUN 7 07/04/2014   NA 137 07/04/2014   K 3.9 07/04/2014   CL 102 07/04/2014   CO2 22 07/04/2014    No results found for: HGBA1C Lipid Panel  No results found for: CHOL, TRIG, HDL, CHOLHDL, VLDL, LDLCALC     Assessment and plan:   [place] was seen today for follow-up.  Diagnoses and all orders for this visit:  Crohn's disease, without complications Work forms completed and patient given return precautions  Return if symptoms worsen or fail to improve.        Chari Manning, NP-C [facility] [phone] 01/26/2015, 3:04 PM

## 2015-01-26 NOTE — Progress Notes (Signed)
Pt is here today needing some paperwork filled out by her provider for her job.

## 2015-07-03 ENCOUNTER — Ambulatory Visit: Payer: Self-pay | Admitting: Internal Medicine

## 2016-05-17 ENCOUNTER — Other Ambulatory Visit: Payer: Self-pay | Admitting: Obstetrics & Gynecology

## 2016-05-23 ENCOUNTER — Other Ambulatory Visit: Payer: Self-pay | Admitting: Obstetrics & Gynecology

## 2016-05-27 ENCOUNTER — Other Ambulatory Visit: Payer: Self-pay | Admitting: Obstetrics & Gynecology

## 2016-12-19 ENCOUNTER — Other Ambulatory Visit: Payer: Self-pay | Admitting: Obstetrics & Gynecology

## 2017-01-20 ENCOUNTER — Encounter: Payer: Self-pay | Admitting: Adult Health

## 2017-01-20 ENCOUNTER — Ambulatory Visit (INDEPENDENT_AMBULATORY_CARE_PROVIDER_SITE_OTHER): Payer: BLUE CROSS/BLUE SHIELD | Admitting: Adult Health

## 2017-01-20 VITALS — BP 124/82 | HR 64 | Ht 66.0 in | Wt 225.0 lb

## 2017-01-20 DIAGNOSIS — Z3049 Encounter for surveillance of other contraceptives: Secondary | ICD-10-CM

## 2017-01-20 DIAGNOSIS — Z3046 Encounter for surveillance of implantable subdermal contraceptive: Secondary | ICD-10-CM

## 2017-01-20 NOTE — Progress Notes (Signed)
Subjective:     Patient ID: Lauren Ramirez, female   DOB: 08/03/1986, 31 y.o.   MRN: [id number]  HPI Lauren Ramirez is a 31 year old black female in for nexplanon removal, it is over due to be removed.She is using condoms when has sex, but that is not often.  Review of Systems For nexplanon removal, is over due to be removed Reviewed past medical,surgical, social and family history. Reviewed medications and allergies.     Objective:   Physical Exam BP 124/82 (BP Location: Left Arm, Patient Position: Sitting, Cuff Size: Large)   Pulse 64   Ht 5' 6"  (1.676 m)   Wt 225 lb (102.1 kg)   LMP 12/27/2016 (Exact Date)   BMI 36.32 kg/m  PHQ 2 score 0.Consent signed, time out called. Left arm cleansed with betadine, and injected with 1.5 cc 1% lidocaine and waited til numb.Under sterile technique a #11 blade was used to make small vertical incision, and a curved forceps was used to easily remove rod. Steri strips applied. Pressure dressing applied.    Assessment:     Nexplanon removal     Plan:      Use condoms, keep clean and dry x 24 hours, no heavy lifting, keep steri strips on x 72 hours, Keep pressure dressing on x 24 hours. Follow up prn problems. Return in 2 weeks for pap and physical

## 2017-01-20 NOTE — Patient Instructions (Signed)
Use condoms, keep clean and dry x 24 hours, no heavy lifting, keep steri strips on x 72 hours, Keep pressure dressing on x 24 hours. Follow up prn problems. Return in 2 weeks for pap and physical

## 2017-02-05 ENCOUNTER — Other Ambulatory Visit: Payer: BLUE CROSS/BLUE SHIELD | Admitting: Adult Health

## 2017-02-20 ENCOUNTER — Ambulatory Visit (INDEPENDENT_AMBULATORY_CARE_PROVIDER_SITE_OTHER): Payer: BLUE CROSS/BLUE SHIELD | Admitting: Adult Health

## 2017-02-20 ENCOUNTER — Encounter: Payer: Self-pay | Admitting: Adult Health

## 2017-02-20 ENCOUNTER — Other Ambulatory Visit (HOSPITAL_COMMUNITY)
Admission: RE | Admit: 2017-02-20 | Discharge: 2017-02-20 | Disposition: A | Discharge status: Home / Self Care | Payer: BLUE CROSS/BLUE SHIELD | Source: Ambulatory Visit | Attending: Adult Health | Admitting: Adult Health

## 2017-02-20 VITALS — BP 126/80 | HR 86 | Ht 66.0 in | Wt 224.5 lb

## 2017-02-20 DIAGNOSIS — Z01419 Encounter for gynecological examination (general) (routine) without abnormal findings: Secondary | ICD-10-CM | POA: Diagnosis not present

## 2017-02-20 NOTE — Progress Notes (Signed)
Patient ID: Lauren Ramirez, female   DOB: 01/03/1986, 31 y.o.   MRN: [id number] History of Present Illness: Reine is a 31 year old black female in for a well woman gyn exam and pap.She had nexplanon removed 01/20/17.   Current Medications, Allergies, Past Medical History, Past Surgical History, Family History and Social History were reviewed in [employer] record.     Review of Systems: Patient denies any headaches, hearing loss, fatigue, blurred vision, shortness of breath, chest pain, abdominal pain, problems with bowel movements, urination, or intercourse. No joint pain or mood swings.    Physical Exam:BP 126/80 (BP Location: Left Arm, Patient Position: Sitting, Cuff Size: Large)   Pulse 86   Ht 5' 6"  (1.676 m)   Wt 224 lb 8 oz (101.8 kg)   LMP 02/10/2017 (Exact Date)   BMI 36.24 kg/m   General:  Well developed, well nourished, no acute distress Skin:  Warm and dry Neck:  Midline trachea, normal thyroid, good ROM, no lymphadenopathy Lungs; Clear to auscultation bilaterally Breast:  No dominant palpable mass, retraction, or nipple discharge Cardiovascular: Regular rate and rhythm Abdomen:  Soft, non tender, no hepatosplenomegaly Pelvic:  External genitalia is normal in appearance, no lesions.  The vagina is normal in appearance. Urethra has no lesions or masses. The cervix is bulbous.Pap with GC/CHL and HPV performed.  Uterus is felt to be normal size, shape, and contour.  No adnexal masses or tenderness noted.Bladder is non tender, no masses felt. Extremities/musculoskeletal:  No swelling or varicosities noted, no clubbing or cyanosis,left arm where nexplanon removed healed Psych:  No mood changes, alert and cooperative,seems happy PHQ 2 score 0.She declines labs.  Impression: 1. Encounter for gynecological examination with Papanicolaou smear of cervix       Plan: Physical in 1 year Pap in 3 if normal Use condoms

## 2017-02-21 LAB — CYTOLOGY - PAP
CHLAMYDIA, DNA PROBE: NEGATIVE
Diagnosis: NEGATIVE
HPV: NOT DETECTED
Neisseria Gonorrhea: NEGATIVE

## 2017-04-16 ENCOUNTER — Emergency Department (HOSPITAL_COMMUNITY)
Admission: EM | Admit: 2017-04-16 | Discharge: 2017-04-17 | Disposition: A | Discharge status: Home / Self Care | Payer: BLUE CROSS/BLUE SHIELD | Attending: Emergency Medicine | Admitting: Emergency Medicine

## 2017-04-16 ENCOUNTER — Encounter (HOSPITAL_COMMUNITY): Payer: Self-pay | Admitting: *Deleted

## 2017-04-16 DIAGNOSIS — F17210 Nicotine dependence, cigarettes, uncomplicated: Secondary | ICD-10-CM | POA: Insufficient documentation

## 2017-04-16 DIAGNOSIS — K5090 Crohn's disease, unspecified, without complications: Secondary | ICD-10-CM

## 2017-04-16 DIAGNOSIS — R1011 Right upper quadrant pain: Secondary | ICD-10-CM

## 2017-04-16 LAB — COMPREHENSIVE METABOLIC PANEL
ALT: 17 U/L (ref 14–54)
AST: 20 U/L (ref 15–41)
Albumin: 3.3 g/dL — ABNORMAL LOW (ref 3.5–5.0)
Alkaline Phosphatase: 63 U/L (ref 38–126)
Anion gap: 6 (ref 5–15)
BILIRUBIN TOTAL: 0.4 mg/dL (ref 0.3–1.2)
BUN: <5 mg/dL — ABNORMAL LOW (ref 6–20)
CO2: 24 mmol/L (ref 22–32)
Calcium: 8.4 mg/dL — ABNORMAL LOW (ref 8.9–10.3)
Chloride: 108 mmol/L (ref 101–111)
Creatinine, Ser: 0.78 mg/dL (ref 0.44–1.00)
GFR calc Af Amer: >60 mL/min (ref >60)
Glucose, Bld: 102 mg/dL — ABNORMAL HIGH (ref 65–99)
POTASSIUM: 3.3 mmol/L — AB (ref 3.5–5.1)
Sodium: 138 mmol/L (ref 135–145)
TOTAL PROTEIN: 6.3 g/dL — AB (ref 6.5–8.1)

## 2017-04-16 LAB — URINALYSIS, ROUTINE W REFLEX MICROSCOPIC
Bilirubin Urine: NEGATIVE
Glucose, UA: NEGATIVE mg/dL
Hgb urine dipstick: NEGATIVE
KETONES UR: NEGATIVE mg/dL
LEUKOCYTES UA: NEGATIVE
NITRITE: NEGATIVE
PH: 6 (ref 5.0–8.0)
Protein, ur: NEGATIVE mg/dL
SPECIFIC GRAVITY, URINE: 1.013 (ref 1.005–1.030)

## 2017-04-16 LAB — CBC
HEMATOCRIT: 36.2 % (ref 36.0–46.0)
Hemoglobin: 11.8 g/dL — ABNORMAL LOW (ref 12.0–15.0)
MCH: 28.6 pg (ref 26.0–34.0)
MCHC: 32.6 g/dL (ref 30.0–36.0)
MCV: 87.7 fL (ref 78.0–100.0)
Platelets: 366 10*3/uL (ref 150–400)
RBC: 4.13 MIL/uL (ref 3.87–5.11)
RDW: 12.6 % (ref 11.5–15.5)
WBC: 7.6 10*3/uL (ref 4.0–10.5)

## 2017-04-16 LAB — LIPASE, BLOOD: LIPASE: 23 U/L (ref 11–51)

## 2017-04-16 LAB — I-STAT BETA HCG BLOOD, ED (MC, WL, AP ONLY): I-stat hCG, quantitative: <5 m[IU]/mL (ref <5)

## 2017-04-16 NOTE — ED Provider Notes (Signed)
[facility] DEPT Provider Note   CSN: [id number] Arrival date & time: 04/16/17  1757  By signing my name below, I, [name], attest that this documentation has been prepared under the direction and in the presence of Darran Gabay, Annie Main, MD. Electronically Signed: [name], Scribe. 04/16/2017. 12:00 AM.  History   Chief Complaint Chief Complaint  Patient presents with  . Emesis  . Abdominal Pain   The history is provided by the patient. No language interpreter was used.   HPI Comments: Lauren Ramirez is a 31 y.o. female with a PMHx of Crohn's disease who presents to the Emergency Department complaining of intermittent moderate generalized abdominal pain that started about a week ago. She states she suspects she is having a Crohn's flare up. Her last flare-up was treated with partial colectomy (small intestine removal) in 2009. She has not taken medication for her Crohn's since then. Her pain is exacerbated by eating. Her episodes range between minutes and hours in duration. She reports associated nausesa, vomiting (twice this week, last episode: yesterday), constipation (mild), and diarrhea (mild). She admits to a hx of C-section. Denies any similar pain since colectomy, hx of appendectomy/oophorectomy, fever, blood in stool or other complaints at this time.  Past Medical History:  Diagnosis Date  . Abnormal Pap smear    ASCUS 2011  . Crohn's disease ([place])   . General counseling and advice for contraceptive management 09/06/2014  . Nexplanon in place 09/06/2014  . Vaginal Pap smear, abnormal     Patient Active Problem List   Diagnosis Date Noted  . Encounter for Nexplanon removal 01/20/2017  . General counseling and advice for contraceptive management 09/06/2014    Past Surgical History:  Procedure Laterality Date  . CESAREAN SECTION  04/14/2012   Procedure: CESAREAN SECTION;  Surgeon: Osborne Oman, MD;  Location: [facility];  Service: Gynecology;  Laterality: N/A;   primary  . SMALL INTESTINE SURGERY      OB History    Gravida Para Term Preterm AB Living   3 3 0 3 0 4   SAB TAB Ectopic Multiple Live Births   0 0 0 1 4       Home Medications    Prior to Admission medications   Not on File    Family History Family History  Problem Relation Age of Onset  . Hypertension Mother   . Hypertension Maternal Grandmother   . Diabetes Maternal Grandmother   . Cancer Maternal Grandfather   . Anesthesia problems Neg Hx     Social History Social History  Substance Use Topics  . Smoking status: Current Every Day Smoker    Packs/day: 0.00    Years: 7.00    Types: Cigarettes    Last attempt to quit: 04/15/2014  . Smokeless tobacco: Never Used     Comment: smokes 4-5 cig daily  . Alcohol use Yes     Comment: only occasionally     Allergies   Patient has no known allergies.   Review of Systems Review of Systems All other systems reviewed and are negative for acute change except as noted in the HPI.  Physical Exam Updated Vital Signs BP (!) 138/95 (BP Location: Right Arm)   Pulse 80   Temp 99.1 F (37.3 C) (Oral)   Resp 18   LMP 04/01/2017   SpO2 100%   Physical Exam  Constitutional: She is oriented to person, place, and time. No distress.  Obese.   HENT:  Head: Normocephalic and  atraumatic.  Mouth/Throat: Oropharynx is clear and moist. No oropharyngeal exudate.  Eyes: Conjunctivae and EOM are normal. Pupils are equal, round, and reactive to light.  Neck: Normal range of motion. Neck supple.  No meningismus.  Cardiovascular: Normal rate, regular rhythm, normal heart sounds and intact distal pulses.   No murmur heard. Pulmonary/Chest: Effort normal and breath sounds normal. No respiratory distress.  Abdominal: Soft. There is tenderness. There is no rebound and no guarding.  Epigastric and RUQ TTP. No guarding or rebound.   Musculoskeletal: Normal range of motion. She exhibits no edema or tenderness.  Neurological: She is  alert and oriented to person, place, and time. No cranial nerve deficit. She exhibits normal muscle tone. Coordination normal.   5/5 strength throughout. CN 2-12 intact.Equal grip strength.   Skin: Skin is warm.  Psychiatric: She has a normal mood and affect. Her behavior is normal.  Nursing note and vitals reviewed.    ED Treatments / Results  DIAGNOSTIC STUDIES: Oxygen Saturation is 100% on RA, normal by my interpretation.    COORDINATION OF CARE: 12:04 AM- Pt advised of plan for treatment and pt agrees.  Labs (all labs ordered are listed, but only abnormal results are displayed) Labs Reviewed  COMPREHENSIVE METABOLIC PANEL - Abnormal; Notable for the following:       Result Value   Potassium 3.3 (*)    Glucose, Bld 102 (*)    BUN <5 (*)    Calcium 8.4 (*)    Total Protein 6.3 (*)    Albumin 3.3 (*)    All other components within normal limits  CBC - Abnormal; Notable for the following:    Hemoglobin 11.8 (*)    All other components within normal limits  URINALYSIS, ROUTINE W REFLEX MICROSCOPIC - Abnormal; Notable for the following:    APPearance HAZY (*)    All other components within normal limits  LIPASE, BLOOD  I-STAT BETA HCG BLOOD, ED ([facility], [facility], [facility] ONLY)    EKG  EKG Interpretation None       Radiology Ct Abdomen Pelvis W Contrast  Result Date: 04/17/2017 CLINICAL DATA:  Generalized mid abdominal pain.  History of Crohn's. EXAM: CT ABDOMEN AND PELVIS WITH CONTRAST TECHNIQUE: Multidetector CT imaging of the abdomen and pelvis was performed using the standard protocol following bolus administration of intravenous contrast. CONTRAST:  174m ISOVUE-300 IOPAMIDOL (ISOVUE-300) INJECTION 61% COMPARISON:  Right upper quadrant ultrasound earlier this day. FINDINGS: Lower chest: The lung bases are clear. Hepatobiliary: No focal hepatic lesion. Calcified gallstones without gallbladder inflammation. No biliary dilatation. Pancreas: No ductal dilatation or inflammation. Spleen:  Normal in size without focal abnormality. Adrenals/Urinary Tract: Normal adrenal glands. No hydronephrosis or perinephric edema. Urinary bladder is minimally distended without wall thickening. Stomach/Bowel: Stomach is physiologically distended. No gastric wall thickening. Proximal small bowel appears normal. Post apparent ileocecectomy. There is moderate length of small bowel wall thickening with perienteric soft tissue stranding and small amount of free fluid in the distal ileum just proximal to enteric sutures in the right mid abdomen. Small bowel contents just proximal to the area of inflammation demonstrate mild fecalization common with fluid-filled small bowel in the central and lower abdomen. Sigmoid colon is tortuous coursing into the right lower quadrant, there is no definite colonic inflammation. Submucosal fatty infiltration of the sigmoid colon suggests chronic inflammation. Distal sigmoid colon is nondistended. Transverse colon extends into the lesser sac. Vascular/Lymphatic: Normal caliber abdominal aorta. Prominence of the ovarian veins. Scattered prominent mesenteric nodes, reactive appearing.  Reproductive: Probable corpus luteal cyst on the left ovary. Right ovaries normal in size. Uterus is unremarkable. Other: Minimal free fluid in the pelvis and mesenteries adjacent to small bowel inflammation. There is no free air. No intra-abdominal abscess. Musculoskeletal: There are no acute or suspicious osseous abnormalities. IMPRESSION: 1. Moderate length segment of active Crohn's involving distal small bowel in the right abdomen just proximal to enteric sutures post ileocecectomy. More proximal small bowel is fluid-filled with fecalization of contents suggesting slow transit secondary to inflammation. No intra-abdominal abscess. 2. Cholelithiasis without acute gallbladder inflammation. Electronically Signed   By: Jeb Levering M.D.   On: 04/17/2017 03:04   US Abdomen Limited Ruq  Result Date:  04/17/2017 CLINICAL DATA:  Right upper quadrant pain for 1 week. EXAM: US ABDOMEN LIMITED - RIGHT UPPER QUADRANT COMPARISON:  None. FINDINGS: Gallbladder: Physiologically distended. Multiple intraluminal calculi largest measuring 1.1 cm. No gallbladder wall thickening or pericholecystic fluid. No sonographic [name] sign noted by sonographer. Common bile duct: Diameter: 3.3 mm, normal. Liver: No focal lesion identified. Within normal limits in parenchymal echogenicity. Normal directional flow in the main portal vein. IMPRESSION: Cholelithiasis without sonographic findings of acute cholecystitis. No biliary dilatation. Electronically Signed   By: Jeb Levering M.D.   On: 04/17/2017 00:53    Procedures Procedures (including critical care time)  Medications Ordered in ED Medications  ondansetron (ZOFRAN) injection 4 mg (4 mg Intravenous Not Given 04/17/17 0011)  iopamidol (ISOVUE-300) 61 % injection (not administered)  methylPREDNISolone sodium succinate (SOLU-MEDROL) 125 mg/2 mL injection 125 mg (not administered)  sodium chloride 0.9 % bolus 1,000 mL (0 mLs Intravenous Stopped 04/17/17 0225)  iopamidol (ISOVUE-300) 61 % injection (100 mLs  Contrast Given 04/17/17 0229)     Initial Impression / Assessment and Plan / ED Course  I have reviewed the triage vital signs and the nursing notes.  Pertinent labs & imaging results that were available during my care of the patient were reviewed by me and considered in my medical decision making (see chart for details).    Patient with history of Crohn's disease present with diffuse abdominal pain. No fever. 2 episodes of vomiting. Denies blood in the stool or diarrhea. She is not any medications for her Crohn's.  Labs show normal LFTs and lipase. Ultrasound shows gallstones without cholecystitis.  CT scan does show evidence of active Crohn's disease in the ileum. Patient given IV steroids.  Patient's pain is controlled. She is requesting to go home.  She does not want to be admitted. We'll discharge with course of steroids, pain and nausea control follow-up with Dr. Oletta Lamas her gastroenterologist who she has not seen in several years. Return precautions discussed.  Final Clinical Impressions(s) / ED Diagnoses   Final diagnoses:  RUQ pain  Exacerbation of Crohn's disease without complication ([place])    New Prescriptions Discharge Medication List as of 04/17/2017  5:50 AM    START taking these medications   Details  HYDROcodone-acetaminophen (NORCO/VICODIN) 5-325 MG tablet Take 1 tablet by mouth every 4 (four) hours as needed., Starting Thu 04/17/2017, Print    ondansetron (ZOFRAN ODT) 4 MG disintegrating tablet Take 1 tablet (4 mg total) by mouth every 8 (eight) hours as needed for nausea or vomiting., Starting Thu 04/17/2017, Print    predniSONE (STERAPRED UNI-PAK 21 TAB) 10 MG (21) TBPK tablet As directed, Print       I personally performed the services described in this documentation, which was scribed in my presence. The recorded information has been  reviewed and is accurate.     Ezequiel Essex, MD 04/17/17 [phone]

## 2017-04-16 NOTE — ED Triage Notes (Signed)
Pt reports mid abd pain for several days. Having n/v. Denies diarrhea. Denies blood in stools or fever. Hx of crohns.

## 2017-04-17 ENCOUNTER — Emergency Department (HOSPITAL_COMMUNITY): Payer: BLUE CROSS/BLUE SHIELD

## 2017-04-17 MED ORDER — HYDROCODONE-ACETAMINOPHEN 5-325 MG PO TABS
1.0000 | ORAL_TABLET | ORAL | 0 refills | Status: DC | PRN
Start: 2017-04-17 — End: 2018-05-26

## 2017-04-17 MED ORDER — METHYLPREDNISOLONE SODIUM SUCC 125 MG IJ SOLR
125.0000 mg | Freq: Once | INTRAMUSCULAR | Status: AC
  Administered 2017-04-17: 125 mg via INTRAVENOUS
  Filled 2017-04-17: qty 2

## 2017-04-17 MED ORDER — IOPAMIDOL (ISOVUE-300) INJECTION 61%
INTRAVENOUS | Status: AC
  Administered 2017-04-17: 100 mL
  Filled 2017-04-17: qty 100

## 2017-04-17 MED ORDER — SODIUM CHLORIDE 0.9 % IV BOLUS (SEPSIS)
1000.0000 mL | Freq: Once | INTRAVENOUS | Status: AC
  Administered 2017-04-17: 1000 mL via INTRAVENOUS

## 2017-04-17 MED ORDER — ONDANSETRON 4 MG PO TBDP: 4.0000 mg | ORAL_TABLET | Freq: Three times a day (TID) | ORAL | 0 refills | Status: DC | PRN

## 2017-04-17 MED ORDER — ONDANSETRON HCL 4 MG/2ML IJ SOLN: 4.0000 mg | Freq: Once | INTRAMUSCULAR | Status: DC

## 2017-04-17 MED ORDER — IOPAMIDOL (ISOVUE-300) INJECTION 61%
INTRAVENOUS | Status: AC
  Filled 2017-04-17: qty 30

## 2017-04-17 MED ORDER — PREDNISONE 10 MG (21) PO TBPK: ORAL_TABLET | ORAL | 0 refills | Status: DC

## 2017-04-17 NOTE — ED Notes (Signed)
Pt came out to the desk yelling stating "Is somebody going to discharge me? I'm ready to go. I've been up for discharge for 2 hrs. I was told the medication is only going to take 30 min to work." Pt informed that she had only been up for discharge a short amount of time and that the RN would be in the room shortly. The pt continued to yell at this RN before proceeding to walk off and slam her room door.

## 2017-04-17 NOTE — Discharge Instructions (Signed)
Take the steroids as prescribed. Follow-up with Dr. Oletta Lamas. Return to the ED if you develop worsening pain, fever or other concerns.

## 2017-11-15 ENCOUNTER — Encounter (HOSPITAL_BASED_OUTPATIENT_CLINIC_OR_DEPARTMENT_OTHER): Payer: Self-pay | Admitting: Emergency Medicine

## 2017-11-15 ENCOUNTER — Emergency Department (HOSPITAL_BASED_OUTPATIENT_CLINIC_OR_DEPARTMENT_OTHER)
Admission: EM | Admit: 2017-11-15 | Discharge: 2017-11-15 | Disposition: A | Discharge status: Home / Self Care | Payer: BLUE CROSS/BLUE SHIELD | Attending: Emergency Medicine | Admitting: Emergency Medicine

## 2017-11-15 ENCOUNTER — Other Ambulatory Visit: Payer: Self-pay

## 2017-11-15 DIAGNOSIS — R42 Dizziness and giddiness: Secondary | ICD-10-CM | POA: Insufficient documentation

## 2017-11-15 DIAGNOSIS — F17210 Nicotine dependence, cigarettes, uncomplicated: Secondary | ICD-10-CM | POA: Insufficient documentation

## 2017-11-15 DIAGNOSIS — R110 Nausea: Secondary | ICD-10-CM | POA: Insufficient documentation

## 2017-11-15 DIAGNOSIS — Z79899 Other long term (current) drug therapy: Secondary | ICD-10-CM | POA: Insufficient documentation

## 2017-11-15 DIAGNOSIS — H73893 Other specified disorders of tympanic membrane, bilateral: Secondary | ICD-10-CM | POA: Insufficient documentation

## 2017-11-15 LAB — COMPREHENSIVE METABOLIC PANEL
ALBUMIN: 3.6 g/dL (ref 3.5–5.0)
ALT: 38 U/L (ref 14–54)
ANION GAP: 5 (ref 5–15)
AST: 23 U/L (ref 15–41)
Alkaline Phosphatase: 83 U/L (ref 38–126)
BILIRUBIN TOTAL: 1.5 mg/dL — AB (ref 0.3–1.2)
BUN: 5 mg/dL — ABNORMAL LOW (ref 6–20)
CO2: 24 mmol/L (ref 22–32)
Calcium: 8.7 mg/dL — ABNORMAL LOW (ref 8.9–10.3)
Chloride: 107 mmol/L (ref 101–111)
Creatinine, Ser: 0.64 mg/dL (ref 0.44–1.00)
GLUCOSE: 94 mg/dL (ref 65–99)
POTASSIUM: 4.1 mmol/L (ref 3.5–5.1)
Sodium: 136 mmol/L (ref 135–145)
TOTAL PROTEIN: 7.6 g/dL (ref 6.5–8.1)

## 2017-11-15 LAB — CBC
HEMATOCRIT: 38.8 % (ref 36.0–46.0)
Hemoglobin: 13 g/dL (ref 12.0–15.0)
MCH: 29 pg (ref 26.0–34.0)
MCHC: 33.5 g/dL (ref 30.0–36.0)
MCV: 86.6 fL (ref 78.0–100.0)
Platelets: 388 10*3/uL (ref 150–400)
RBC: 4.48 MIL/uL (ref 3.87–5.11)
RDW: 12.8 % (ref 11.5–15.5)
WBC: 8.2 10*3/uL (ref 4.0–10.5)

## 2017-11-15 LAB — PREGNANCY, URINE: PREG TEST UR: NEGATIVE

## 2017-11-15 MED ORDER — MECLIZINE HCL 25 MG PO TABS
25.0000 mg | ORAL_TABLET | Freq: Once | ORAL | Status: AC
  Administered 2017-11-15: 25 mg via ORAL
  Filled 2017-11-15: qty 1

## 2017-11-15 MED ORDER — MECLIZINE HCL 25 MG PO TABS: 25.0000 mg | ORAL_TABLET | Freq: Three times a day (TID) | ORAL | 0 refills | Status: DC | PRN

## 2017-11-15 NOTE — ED Provider Notes (Signed)
[facility] EMERGENCY DEPARTMENT Provider Note   CSN: [id number] Arrival date & time: 11/15/17  1222     History   Chief Complaint Chief Complaint  Patient presents with  . Dizziness    HPI Lauren Ramirez is a 31 y.o. female.  HPI   Pt with a h/o Crohn's disease presents to the ED c/o dizziness that began around 1am last night. She states that it feels like the room is spinning. The dizziness occurs when she turns her head to her right or stands up. It lasts for a few seconds at a time and resolves on its own. It has improved since onset. She has not taken any medicines for her sxs. She also reports nausea that has resolved. She denies any other sxs such as fevers, chills, recent URI sxs, visual changes, SOB, chest pain, abd pain, vomiting, constipation, syncope, weakness/numbness or any other sxs. She has had diarrhea due to her Crohn's, but states that this is chronic and at baseline.  She has been able to ambulate at home without difficulty.   States she has some palpitations when she gets nervous, but none currently. Denies calf pain, swelling, redness, use of OCPs, recent surgery, or recent extended travel. LMP 11/25 was normal for pt.   Past Medical History:  Diagnosis Date  . Abnormal Pap smear    ASCUS 2011  . Crohn's disease ([place])   . General counseling and advice for contraceptive management 09/06/2014  . Nexplanon in place 09/06/2014  . Vaginal Pap smear, abnormal     Patient Active Problem List   Diagnosis Date Noted  . Encounter for Nexplanon removal 01/20/2017  . General counseling and advice for contraceptive management 09/06/2014    Past Surgical History:  Procedure Laterality Date  . CESAREAN SECTION  04/14/2012   Procedure: CESAREAN SECTION;  Surgeon: Osborne Oman, MD;  Location: [facility];  Service: Gynecology;  Laterality: N/A;  primary  . SMALL INTESTINE SURGERY      OB History    Gravida Para Term Preterm AB Living   3 3 0 3 0 4     SAB TAB Ectopic Multiple Live Births   0 0 0 1 4       Home Medications    Prior to Admission medications   Medication Sig Start Date End Date Taking? Authorizing Provider  HYDROcodone-acetaminophen (NORCO/VICODIN) 5-325 MG tablet Take 1 tablet by mouth every 4 (four) hours as needed. 04/17/17   Rancour, Annie Main, MD  meclizine (ANTIVERT) 25 MG tablet Take 1 tablet (25 mg total) by mouth 3 (three) times daily as needed for dizziness. 11/15/17   Serenah Mill S, PA-C  ondansetron (ZOFRAN ODT) 4 MG disintegrating tablet Take 1 tablet (4 mg total) by mouth every 8 (eight) hours as needed for nausea or vomiting. 04/17/17   Rancour, Annie Main, MD  predniSONE (STERAPRED UNI-PAK 21 TAB) 10 MG (21) TBPK tablet As directed 04/17/17   Ezequiel Essex, MD    Family History Family History  Problem Relation Age of Onset  . Hypertension Mother   . Hypertension Maternal Grandmother   . Diabetes Maternal Grandmother   . Cancer Maternal Grandfather   . Anesthesia problems Neg Hx     Social History Social History   Tobacco Use  . Smoking status: Current Every Day Smoker    Packs/day: 0.00    Years: 7.00    Pack years: 0.00    Types: Cigarettes    Last attempt to quit: 04/15/2014  Years since quitting: 3.5  . Smokeless tobacco: Never Used  . Tobacco comment: smokes 4-5 cig daily  Substance Use Topics  . Alcohol use: Yes    Comment: only occasionally  . Drug use: No     Allergies   Patient has no known allergies.   Review of Systems Review of Systems  Constitutional: Negative for chills and fever.  HENT: Negative for congestion, ear pain, rhinorrhea and sore throat.   Eyes: Negative for pain and visual disturbance.  Respiratory: Negative for cough and shortness of breath.   Cardiovascular: Negative for chest pain and palpitations.  Gastrointestinal: Positive for diarrhea and nausea. Negative for abdominal pain, constipation and vomiting.  Genitourinary: Negative for dysuria and  hematuria.  Musculoskeletal: Negative for arthralgias, back pain, neck pain and neck stiffness.  Skin: Negative for color change and rash.  Neurological: Positive for dizziness. Negative for seizures, syncope, weakness, light-headedness and numbness.  All other systems reviewed and are negative.    Physical Exam Updated Vital Signs BP 131/81 (BP Location: Right Arm)   Pulse 84   Temp 98 F (36.7 C) (Oral)   Resp 18   Ht 5' 6"  (1.676 m)   Wt 98 kg (216 lb)   LMP 10/22/2017   SpO2 100%   BMI 34.86 kg/m   Physical Exam  Constitutional: She is oriented to person, place, and time. She appears well-developed and well-nourished. No distress.  HENT:  Head: Normocephalic and atraumatic.  Right Ear: External ear normal.  Left Ear: External ear normal.  Nose: Nose normal.  Mouth/Throat: Oropharynx is clear and moist. No oropharyngeal exudate.  Fluid behind bilateral TMs, left greater than right. No erythema to the bilateral TMs.   Eyes: Conjunctivae and EOM are normal. Pupils are equal, round, and reactive to light.  Neck: Normal range of motion. Neck supple.  Cardiovascular: Normal rate and regular rhythm.  No murmur heard. Pulmonary/Chest: Effort normal and breath sounds normal. No respiratory distress.  Abdominal: Soft. Bowel sounds are normal. She exhibits no distension. There is no tenderness.  Musculoskeletal: She exhibits no edema.  No calf TTP, erythema, or swelling bilaterally.  Lymphadenopathy:    She has no cervical adenopathy.  Neurological: She is alert and oriented to person, place, and time. No cranial nerve deficit. She exhibits normal muscle tone. Coordination normal.  Mental Status:  Alert, thought content appropriate, able to give a coherent history. Speech fluent without evidence of aphasia. Able to follow 2 step commands without difficulty.  Cranial Nerves:  II:  pupils equal, round, reactive to light III,IV, VI: extra-ocular motions intact bilaterally  V,VII:  smile symmetric VIII: hearing grossly normal to voice  X: uvula elevates symmetrically  XI: bilateral shoulder shrug symmetric and strong XII: midline tongue extension Normal tone. 5/5 strength of BUE and BLE major muscle groups including strong and equal grip strength and dorsiflexion/plantar flexion Gait: normal gait and balance. Able to walk on toes and heels with ease.  CV: 2+ DP/PT pulses   Skin: Skin is warm and dry.  Psychiatric: She has a normal mood and affect.  Nursing note and vitals reviewed.    ED Treatments / Results  Labs (all labs ordered are listed, but only abnormal results are displayed) Labs Reviewed  COMPREHENSIVE METABOLIC PANEL - Abnormal; Notable for the following components:      Result Value   BUN 5 (*)    Calcium 8.7 (*)    Total Bilirubin 1.5 (*)    All other components within  normal limits  CBC  PREGNANCY, URINE    EKG  EKG Interpretation  Date/Time:  Saturday November 15 2017 12:41:14 EST Ventricular Rate:  92 PR Interval:  120 QRS Duration: 80 QT Interval:  374 QTC Calculation: 462 R Axis:   70 Text Interpretation:  Normal sinus rhythm Normal ECG No previous tracing Confirmed by Blanchie Dessert [id number]) on 11/15/2017 1:14:19 PM       Radiology No results found.  Procedures Procedures (including critical care time)  Medications Ordered in ED Medications  meclizine (ANTIVERT) tablet 25 mg (25 mg Oral Given 11/15/17 1431)     Initial Impression / Assessment and Plan / ED Course  I have reviewed the triage vital signs and the nursing notes.  Pertinent labs & imaging results that were available during my care of the patient were reviewed by me and considered in my medical decision making (see chart for details).    Evaluated pt in the ED. Orders placed.   Rechecked pt. They are feeling better after meclizine.  Rx sent for meclizine. Offered Zofran as well, but pt declined. Advised pt not to drive, operate machinery, or work  while taking meclizine. Discussed plan for discharge with outpatient follow up in 5-7 days. Advised pt to return to the ER if they experience any new or worsening symptoms. Pt understands and agrees with the plan. All questions answered.   Final Clinical Impressions(s) / ED Diagnoses   Final diagnoses:  Vertigo   Pt presented with 1 day h/o dizziness that occurs when turning head and standing up. Episodes last seconds and resolve on their own. Vertigo improved with Meclizine. No neurologic deficits noted and s/s not convincing of an emergent etioloy. Most likely this is secondary to BPPV. Rx given for Meclizine. Return precautions given.   ED Discharge Orders        Ordered    meclizine (ANTIVERT) 25 MG tablet  3 times daily PRN     11/15/17 1549       Shaka Zech S, PA-C 11/15/17 1724    Blanchie Dessert, MD 11/16/17 [phone]

## 2017-11-15 NOTE — Discharge Instructions (Signed)
Prescription given for meclizine. Do not drive a car, operate machinery, or go to work while taking this medication. Return to the emergency room if you have any new or worsening symptoms.

## 2017-11-15 NOTE — ED Notes (Addendum)
Pt given d/c instructions as per chart. rx x 1 with precautions. Verbalizes understanding. No questions.

## 2017-11-15 NOTE — ED Triage Notes (Signed)
Pt c/o dizziness since last pm when moving too fast

## 2017-12-02 NOTE — L&D Delivery Note (Signed)
Called by RN for delivery, on arrival, demised fetus had been delivered on bed intact, cord still in place. Cord clamped and cut and fetus wrapped in blanket, taken away as mom did not want to see fetus. Fetus hydropic with enlarged gelatinous area of cord near cord insertion on body, no other obvious abnormalities noted.   Gentle traction placed on cord but cord started to tear so traction stopped. Pitocin started. Small dark clot expelled, placenta remains in uterus. Will give time for placental delivery, if no delivery will give another dose cytotec as long as patient is stable. She verbalizes understanding. Answered all questions. Patient's mom in room for support.    Feliz Beam, M.D. [facility]

## 2018-05-26 ENCOUNTER — Encounter: Payer: Self-pay | Admitting: Adult Health

## 2018-05-26 ENCOUNTER — Ambulatory Visit (INDEPENDENT_AMBULATORY_CARE_PROVIDER_SITE_OTHER): Payer: Medicaid Other | Admitting: Adult Health

## 2018-05-26 VITALS — BP 122/78 | HR 95 | Ht 66.0 in | Wt 217.0 lb

## 2018-05-26 DIAGNOSIS — O09211 Supervision of pregnancy with history of pre-term labor, first trimester: Secondary | ICD-10-CM

## 2018-05-26 DIAGNOSIS — Z8719 Personal history of other diseases of the digestive system: Secondary | ICD-10-CM | POA: Insufficient documentation

## 2018-05-26 DIAGNOSIS — O09891 Supervision of other high risk pregnancies, first trimester: Secondary | ICD-10-CM

## 2018-05-26 DIAGNOSIS — O09892 Supervision of other high risk pregnancies, second trimester: Secondary | ICD-10-CM | POA: Insufficient documentation

## 2018-05-26 DIAGNOSIS — Z3A01 Less than 8 weeks gestation of pregnancy: Secondary | ICD-10-CM | POA: Insufficient documentation

## 2018-05-26 DIAGNOSIS — Z8751 Personal history of pre-term labor: Secondary | ICD-10-CM | POA: Insufficient documentation

## 2018-05-26 DIAGNOSIS — N926 Irregular menstruation, unspecified: Secondary | ICD-10-CM

## 2018-05-26 DIAGNOSIS — Z98891 History of uterine scar from previous surgery: Secondary | ICD-10-CM | POA: Insufficient documentation

## 2018-05-26 DIAGNOSIS — O3680X0 Pregnancy with inconclusive fetal viability, not applicable or unspecified: Secondary | ICD-10-CM | POA: Insufficient documentation

## 2018-05-26 DIAGNOSIS — Z3201 Encounter for pregnancy test, result positive: Secondary | ICD-10-CM | POA: Diagnosis not present

## 2018-05-26 DIAGNOSIS — O09212 Supervision of pregnancy with history of pre-term labor, second trimester: Secondary | ICD-10-CM

## 2018-05-26 HISTORY — DX: Supervision of other high risk pregnancies, first trimester: O09.891

## 2018-05-26 LAB — POCT URINE PREGNANCY: PREG TEST UR: POSITIVE — AB

## 2018-05-26 MED ORDER — FOLIC ACID 1 MG PO TABS: 1.0000 mg | ORAL_TABLET | Freq: Every day | ORAL | Status: DC

## 2018-05-26 NOTE — Progress Notes (Signed)
  Subjective:     Patient ID: Lauren Ramirez, female   DOB: 1986-09-15, 32 y.o.   MRN: [id number]  HPI Lauren Ramirez is a 32 year old black female in for UPT,has missed a period and had 1+HPT.  Review of Systems +missed period with +HPT Denies any nausea,vomiting or bleeding  Reviewed past medical,surgical, social and family history. Reviewed medications and allergies.     Objective:   Physical Exam BP 122/78 (BP Location: Left Arm, Patient Position: Sitting, Cuff Size: Large)   Pulse 95   Ht 5' 6"  (1.676 m)   Wt 217 lb (98.4 kg)   LMP 04/07/2018   BMI 35.02 kg/m UPT +,about 7 weeks by LMP with EDD 01/12/19.Skin warm and dry. Neck: mid line trachea, normal thyroid, good ROM, no lymphadenopathy noted. Lungs: clear to ausculation bilaterally. Cardiovascular: regular rate and rhythm.Abdomen is soft and non tender.    Assessment:     1. Pregnancy examination or test, positive result   2. Less than [redacted] weeks gestation of pregnancy   3. Encounter to determine fetal viability of pregnancy, single or unspecified fetus   4. History of preterm delivery, currently pregnant in first trimester   5. History of cesarean section   6. History of Crohn's disease       Plan:     Meds ordered this encounter  Medications  . folic acid (FOLVITE) 1 MG tablet    Sig: Take 1 tablet (1 mg total) by mouth daily.    Dispense:  30 tablet    Order Specific Question:   Supervising Provider    Answer:   Florian Buff [[id number]]     Return in 1 week for dating [place] Review handout on First trimester and by [facility]

## 2018-05-26 NOTE — Patient Instructions (Signed)
First Trimester of Pregnancy The first trimester of pregnancy is from week 1 until the end of week 13 (months 1 through 3). A week after a sperm fertilizes an egg, the egg will implant on the wall of the uterus. This embryo will begin to develop into a baby. Genes from you and your partner will form the baby. The female genes will determine whether the baby will be a boy or a girl. At 6-8 weeks, the eyes and face will be formed, and the heartbeat can be seen on ultrasound. At the end of 12 weeks, all the baby's organs will be formed. Now that you are pregnant, you will want to do everything you can to have a healthy baby. Two of the most important things are to get good prenatal care and to follow your health care provider's instructions. Prenatal care is all the medical care you receive before the baby's birth. This care will help prevent, find, and treat any problems during the pregnancy and childbirth. Body changes during your first trimester Your body goes through many changes during pregnancy. The changes vary from woman to woman.  You may gain or lose a couple of pounds at first.  You may feel sick to your stomach (nauseous) and you may throw up (vomit). If the vomiting is uncontrollable, call your health care provider.  You may tire easily.  You may develop headaches that can be relieved by medicines. All medicines should be approved by your health care provider.  You may urinate more often. Painful urination may mean you have a bladder infection.  You may develop heartburn as a result of your pregnancy.  You may develop constipation because certain hormones are causing the muscles that push stool through your intestines to slow down.  You may develop hemorrhoids or swollen veins (varicose veins).  Your breasts may begin to grow larger and become tender. Your nipples may stick out more, and the tissue that surrounds them (areola) may become darker.  Your gums may bleed and may be  sensitive to brushing and flossing.  Dark spots or blotches (chloasma, mask of pregnancy) may develop on your face. This will likely fade after the baby is born.  Your menstrual periods will stop.  You may have a loss of appetite.  You may develop cravings for certain kinds of food.  You may have changes in your emotions from day to day, such as being excited to be pregnant or being concerned that something may go wrong with the pregnancy and baby.  You may have more vivid and strange dreams.  You may have changes in your hair. These can include thickening of your hair, rapid growth, and changes in texture. Some women also have hair loss during or after pregnancy, or hair that feels dry or thin. Your hair will most likely return to normal after your baby is born.  What to expect at prenatal visits During a routine prenatal visit:  You will be weighed to make sure you and the baby are growing normally.  Your blood pressure will be taken.  Your abdomen will be measured to track your baby's growth.  The fetal heartbeat will be listened to between weeks 10 and 14 of your pregnancy.  Test results from any previous visits will be discussed.  Your health care provider may ask you:  How you are feeling.  If you are feeling the baby move.  If you have had any abnormal symptoms, such as leaking fluid, bleeding, severe headaches,  or abdominal cramping.  If you are using any tobacco products, including cigarettes, chewing tobacco, and electronic cigarettes.  If you have any questions.  Other tests that may be performed during your first trimester include:  Blood tests to find your blood type and to check for the presence of any previous infections. The tests will also be used to check for low iron levels (anemia) and protein on red blood cells (Rh antibodies). Depending on your risk factors, or if you previously had diabetes during pregnancy, you may have tests to check for high blood  sugar that affects pregnant women (gestational diabetes).  Urine tests to check for infections, diabetes, or protein in the urine.  An ultrasound to confirm the proper growth and development of the baby.  Fetal screens for spinal cord problems (spina bifida) and Down syndrome.  HIV (human immunodeficiency virus) testing. Routine prenatal testing includes screening for HIV, unless you choose not to have this test.  You may need other tests to make sure you and the baby are doing well.  Follow these instructions at home: Medicines  Follow your health care provider's instructions regarding medicine use. Specific medicines may be either safe or unsafe to take during pregnancy.  Take a prenatal vitamin that contains at least 600 micrograms (mcg) of folic acid.  If you develop constipation, try taking a stool softener if your health care provider approves. Eating and drinking  Eat a balanced diet that includes fresh fruits and vegetables, whole grains, good sources of protein such as meat, eggs, or tofu, and low-fat dairy. Your health care provider will help you determine the amount of weight gain that is right for you.  Avoid raw meat and uncooked cheese. These carry germs that can cause birth defects in the baby.  Eating four or five small meals rather than three large meals a day may help relieve nausea and vomiting. If you start to feel nauseous, eating a few soda crackers can be helpful. Drinking liquids between meals, instead of during meals, also seems to help ease nausea and vomiting.  Limit foods that are high in fat and processed sugars, such as fried and sweet foods.  To prevent constipation: ? Eat foods that are high in fiber, such as fresh fruits and vegetables, whole grains, and beans. ? Drink enough fluid to keep your urine clear or pale yellow. Activity  Exercise only as directed by your health care provider. Most women can continue their usual exercise routine during  pregnancy. Try to exercise for 30 minutes at least 5 days a week. Exercising will help you: ? Control your weight. ? Stay in shape. ? Be prepared for labor and delivery.  Experiencing pain or cramping in the lower abdomen or lower back is a good sign that you should stop exercising. Check with your health care provider before continuing with normal exercises.  Try to avoid standing for long periods of time. Move your legs often if you must stand in one place for a long time.  Avoid heavy lifting.  Wear low-heeled shoes and practice good posture.  You may continue to have sex unless your health care provider tells you not to. Relieving pain and discomfort  Wear a good support bra to relieve breast tenderness.  Take warm sitz baths to soothe any pain or discomfort caused by hemorrhoids. Use hemorrhoid cream if your health care provider approves.  Rest with your legs elevated if you have leg cramps or low back pain.  If you develop  varicose veins in your legs, wear support hose. Elevate your feet for 15 minutes, 3-4 times a day. Limit salt in your diet. Prenatal care  Schedule your prenatal visits by the twelfth week of pregnancy. They are usually scheduled monthly at first, then more often in the last 2 months before delivery.  Write down your questions. Take them to your prenatal visits.  Keep all your prenatal visits as told by your health care provider. This is important. Safety  Wear your seat belt at all times when driving.  Make a list of emergency phone numbers, including numbers for family, friends, the hospital, and police and fire departments. General instructions  Ask your health care provider for a referral to a local prenatal education class. Begin classes no later than the beginning of month 6 of your pregnancy.  Ask for help if you have counseling or nutritional needs during pregnancy. Your health care provider can offer advice or refer you to specialists for help  with various needs.  Do not use hot tubs, steam rooms, or saunas.  Do not douche or use tampons or scented sanitary pads.  Do not cross your legs for long periods of time.  Avoid cat litter boxes and soil used by cats. These carry germs that can cause birth defects in the baby and possibly loss of the fetus by miscarriage or stillbirth.  Avoid all smoking, herbs, alcohol, and medicines not prescribed by your health care provider. Chemicals in these products affect the formation and growth of the baby.  Do not use any products that contain nicotine or tobacco, such as cigarettes and e-cigarettes. If you need help quitting, ask your health care provider. You may receive counseling support and other resources to help you quit.  Schedule a dentist appointment. At home, brush your teeth with a soft toothbrush and be gentle when you floss. Contact a health care provider if:  You have dizziness.  You have mild pelvic cramps, pelvic pressure, or nagging pain in the abdominal area.  You have persistent nausea, vomiting, or diarrhea.  You have a bad smelling vaginal discharge.  You have pain when you urinate.  You notice increased swelling in your face, hands, legs, or ankles.  You are exposed to fifth disease or chickenpox.  You are exposed to [place] measles (rubella) and have never had it. Get help right away if:  You have a fever.  You are leaking fluid from your vagina.  You have spotting or bleeding from your vagina.  You have severe abdominal cramping or pain.  You have rapid weight gain or loss.  You vomit blood or material that looks like coffee grounds.  You develop a severe headache.  You have shortness of breath.  You have any kind of trauma, such as from a fall or a car accident. Summary  The first trimester of pregnancy is from week 1 until the end of week 13 (months 1 through 3).  Your body goes through many changes during pregnancy. The changes vary from  woman to woman.  You will have routine prenatal visits. During those visits, your health care provider will examine you, discuss any test results you may have, and talk with you about how you are feeling. This information is not intended to replace advice given to you by your health care provider. Make sure you discuss any questions you have with your health care provider. Document Released: 11/12/2001 Document Revised: 10/30/2016 Document Reviewed: 10/30/2016 Elsevier Interactive Patient Education  2018 [employer]

## 2018-06-09 ENCOUNTER — Ambulatory Visit (INDEPENDENT_AMBULATORY_CARE_PROVIDER_SITE_OTHER): Payer: Medicaid Other

## 2018-06-09 DIAGNOSIS — O3680X0 Pregnancy with inconclusive fetal viability, not applicable or unspecified: Secondary | ICD-10-CM

## 2018-06-09 NOTE — Progress Notes (Signed)
[place] 9 wks,single IUP w/ys,positive fht 178 bpm,normal ovaries bilat,crl 19.31 mm

## 2018-06-23 ENCOUNTER — Encounter: Payer: Medicaid Other | Admitting: Women's Health

## 2018-06-23 ENCOUNTER — Ambulatory Visit: Payer: Medicaid Other | Admitting: *Deleted

## 2018-06-29 ENCOUNTER — Telehealth: Payer: Self-pay | Admitting: Women's Health

## 2018-06-29 NOTE — Telephone Encounter (Signed)
If that is who she has seen in the past and they cared for her then she should recontact them initially for care management and plan

## 2018-06-29 NOTE — Telephone Encounter (Signed)
Pt called stating that she is having a flair up of her Crohns disease. She states that she is very gassy and having a lot of pain. Advised pt that she should consult with her gastroenterologist. She states that she doesn't have one. She states that she saw [facility] in the past. She states that it first started with the pregnancy of her 1st child and she eventually became obstructed. She states she rarely has a flare up so she hasnt been to the doctor for this in years. Advised pt to call [facility] gastroenter[facility] and see if they would get her back in to see a provider. Advised that I would consult with one of our providers and get back to her if there is anything to do differently. Pt verbalized understanding.

## 2018-06-29 NOTE — Telephone Encounter (Signed)
Pt needs to speak to a nurse in ref to a Crohns  flair up, She is 12 weeks.

## 2018-06-30 NOTE — Telephone Encounter (Signed)
Pt states that she called [facility] and was able to get an appt to manage her crohns but they are requesting a referral. Advised that we would get one sent over.

## 2018-07-07 ENCOUNTER — Telehealth: Payer: Self-pay | Admitting: Obstetrics & Gynecology

## 2018-07-08 ENCOUNTER — Encounter (HOSPITAL_COMMUNITY): Payer: Self-pay | Admitting: *Deleted

## 2018-07-08 ENCOUNTER — Inpatient Hospital Stay (HOSPITAL_COMMUNITY)
Admission: AD | Admit: 2018-07-08 | Discharge: 2018-07-08 | Disposition: A | Discharge status: Home / Self Care | Payer: Medicaid Other | Source: Ambulatory Visit | Attending: Obstetrics & Gynecology | Admitting: Obstetrics & Gynecology

## 2018-07-08 ENCOUNTER — Encounter: Payer: Medicaid Other | Admitting: Women's Health

## 2018-07-08 DIAGNOSIS — Z87891 Personal history of nicotine dependence: Secondary | ICD-10-CM | POA: Insufficient documentation

## 2018-07-08 DIAGNOSIS — R1110 Vomiting, unspecified: Secondary | ICD-10-CM | POA: Diagnosis present

## 2018-07-08 DIAGNOSIS — O34219 Maternal care for unspecified type scar from previous cesarean delivery: Secondary | ICD-10-CM | POA: Insufficient documentation

## 2018-07-08 DIAGNOSIS — O219 Vomiting of pregnancy, unspecified: Secondary | ICD-10-CM | POA: Diagnosis not present

## 2018-07-08 DIAGNOSIS — Z3A13 13 weeks gestation of pregnancy: Secondary | ICD-10-CM | POA: Diagnosis not present

## 2018-07-08 DIAGNOSIS — K5090 Crohn's disease, unspecified, without complications: Secondary | ICD-10-CM | POA: Diagnosis not present

## 2018-07-08 DIAGNOSIS — O99611 Diseases of the digestive system complicating pregnancy, first trimester: Secondary | ICD-10-CM | POA: Diagnosis not present

## 2018-07-08 DIAGNOSIS — Z8249 Family history of ischemic heart disease and other diseases of the circulatory system: Secondary | ICD-10-CM | POA: Insufficient documentation

## 2018-07-08 LAB — URINALYSIS, ROUTINE W REFLEX MICROSCOPIC
BACTERIA UA: NONE SEEN
Bilirubin Urine: NEGATIVE
Glucose, UA: NEGATIVE mg/dL
HGB URINE DIPSTICK: NEGATIVE
Ketones, ur: 80 mg/dL — AB
Leukocytes, UA: NEGATIVE
NITRITE: NEGATIVE
PROTEIN: 100 mg/dL — AB
SPECIFIC GRAVITY, URINE: 1.025 (ref 1.005–1.030)
pH: 6 (ref 5.0–8.0)

## 2018-07-08 MED ORDER — M.V.I. ADULT IV INJ: Freq: Once | INTRAVENOUS | Status: DC

## 2018-07-08 MED ORDER — PROMETHAZINE HCL 25 MG/ML IJ SOLN
25.0000 mg | Freq: Once | INTRAMUSCULAR | Status: AC
  Administered 2018-07-08: 25 mg via INTRAVENOUS
  Filled 2018-07-08: qty 1

## 2018-07-08 MED ORDER — M.V.I. ADULT IV INJ
Freq: Once | INTRAVENOUS | Status: AC
  Administered 2018-07-08: 13:00:00 via INTRAVENOUS
  Filled 2018-07-08: qty 10

## 2018-07-08 MED ORDER — DOXYLAMINE SUCCINATE (SLEEP) 25 MG PO TABS: 25.0000 mg | ORAL_TABLET | Freq: Three times a day (TID) | ORAL | 0 refills | Status: DC | PRN

## 2018-07-08 MED ORDER — PYRIDOXINE HCL 25 MG PO TABS: 25.0000 mg | ORAL_TABLET | Freq: Three times a day (TID) | ORAL | 0 refills | Status: DC

## 2018-07-08 MED ORDER — ONDANSETRON 4 MG PO TBDP: 4.0000 mg | ORAL_TABLET | Freq: Once | ORAL | Status: DC

## 2018-07-08 NOTE — MAU Provider Note (Signed)
History     CSN: [id number]  Arrival date and time: 07/08/18 1022   First Provider Initiated Contact with Patient 07/08/18 1111      Chief Complaint  Patient presents with  . Abdominal Pain  . Emesis   HPI Lauren Ramirez is a 32 y.o. [phone] at 61w1dwho presents to MAU with chief complaint of continuous vomiting for past four days. Patient estimates she has vomited four times in past 24 hour and cannot remember her last solid meal, "maybe saltine crackers yesterday". Denies vaginal bleeding, leaking of fluid, decreased fetal movement, fever, falls, or recent illness.    OB History    Gravida  4   Para  3   Term  0   Preterm  3   AB  0   Living  4     SAB  0   TAB  0   Ectopic  0   Multiple  1   Live Births  4           Past Medical History:  Diagnosis Date  . Abnormal Pap smear    ASCUS 2011  . [facility]   . General counseling and advice for contraceptive management 09/06/2014  . Nexplanon in place 09/06/2014  . Vaginal Pap smear, abnormal     Past Surgical History:  Procedure Laterality Date  . CESAREAN SECTION  04/14/2012   Procedure: CESAREAN SECTION;  Surgeon: UOsborne Oman MD;  [facility];  Service: Gynecology;  Laterality: N/A;  primary  . DILATION AND CURETTAGE OF UTERUS    . SMALL INTESTINE SURGERY      Family History  Problem Relation Age of Onset  . Hypertension Mother   . Hypertension Maternal Grandmother   . Diabetes Maternal Grandmother   . Cancer Maternal Grandfather   . Anesthesia problems Neg Hx     Social History   Tobacco Use  . Smoking status: Former Smoker    Packs/day: 0.00    Years: 7.00    Pack years: 0.00    Types: Cigarettes    Last attempt to quit: 04/15/2014    Years since quitting: 4.2  . Smokeless tobacco: Never Used  . Tobacco comment: smokes 4-5 cig daily  Substance Use Topics  . Alcohol use: Not Currently    Comment: only occasionally; before pregnancy  . Drug use: No     Allergies: No Known Allergies  Medications Prior to Admission  Medication Sig Dispense Refill Last Dose  . folic acid (FOLVITE) 1 MG tablet Take 1 tablet (1 mg total) by mouth daily. 30 tablet      Review of Systems  Constitutional: Negative for chills, fatigue and fever.  Respiratory: Negative for shortness of breath.   Gastrointestinal: Positive for nausea and vomiting. Negative for abdominal pain.  Genitourinary: Negative for vaginal bleeding, vaginal discharge and vaginal pain.  Neurological: Negative for dizziness and headaches.  All other systems reviewed and are negative.  Physical Exam   Blood pressure 117/78, pulse (!) 101, temperature 98.3 F (36.8 C), temperature source Oral, resp. rate 19, height 5' 6"  (1.676 m), weight 218 lb (98.9 kg), last menstrual period 04/07/2018, SpO2 98 %.  Physical Exam  Nursing note and vitals reviewed. Constitutional: She is oriented to person, place, and time. She appears well-developed and well-nourished.  Cardiovascular: Normal rate, regular rhythm, normal heart sounds and intact distal pulses.  Respiratory: Effort normal.  GI: Soft. Bowel sounds are normal. She exhibits no distension and no  mass. There is no tenderness. There is no rebound and no guarding.  Musculoskeletal: Normal range of motion.  Neurological: She is alert and oriented to person, place, and time. She has normal reflexes.  Skin: Skin is warm and dry.  Psychiatric: She has a normal mood and affect. Her behavior is normal. Judgment and thought content normal.    MAU Course  Procedures  MDM --FHT 145 in MAU --Sleeping after IV Phenergan Patient Vitals for the past 24 hrs:  BP Temp Temp src Pulse Resp SpO2 Height Weight  07/08/18 1513 112/74 - - - - - - -  07/08/18 1024 117/78 98.3 F (36.8 C) Oral (!) 101 19 98 % 5' 6"  (1.676 m) 218 lb (98.9 kg)    Orders Placed This Encounter  Procedures  . Urinalysis, Routine w reflex microscopic  . Insert peripheral IV   . Discontinue IV  . Discharge patient   Results for orders placed or performed during the hospital encounter of 07/08/18 (from the past 24 hour(s))  Urinalysis, Routine w reflex microscopic     Status: Abnormal   Collection Time: 07/08/18 10:39 AM  Result Value Ref Range   Color, Urine AMBER (A) YELLOW   APPearance CLOUDY (A) CLEAR   Specific Gravity, Urine 1.025 1.005 - 1.030   pH 6.0 5.0 - 8.0   Glucose, UA NEGATIVE NEGATIVE mg/dL   Hgb urine dipstick NEGATIVE NEGATIVE   Bilirubin Urine NEGATIVE NEGATIVE   Ketones, ur 80 (A) NEGATIVE mg/dL   Protein, ur 100 (A) NEGATIVE mg/dL   Nitrite NEGATIVE NEGATIVE   Leukocytes, UA NEGATIVE NEGATIVE   RBC / HPF 0-5 0 - 5 RBC/hpf   WBC, UA 0-5 0 - 5 WBC/hpf   Bacteria, UA NONE SEEN NONE SEEN   Squamous Epithelial / LPF 11-20 0 - 5   Mucus PRESENT    Meds ordered this encounter  Medications  . DISCONTD: ondansetron (ZOFRAN-ODT) disintegrating tablet 4 mg  . promethazine (PHENERGAN) 25 mg in dextrose 5% lactated ringers 1,000 mL infusion  . DISCONTD: multivitamins adult (MVI -12) 10 mL in dextrose 5% lactated ringers 1,000 mL infusion  . multivitamins adult (MVI -12) 10 mL in lactated ringers 1,000 mL infusion  . pyridOXINE (VITAMIN B-6) 25 MG tablet    Sig: Take 1 tablet (25 mg total) by mouth every 8 (eight) hours.    Dispense:  30 tablet    Refill:  0    Order Specific Question:   Supervising Provider    Answer:   Donnamae Jude [[id number]]  . doxylamine, Sleep, (UNISOM) 25 MG tablet    Sig: Take 1 tablet (25 mg total) by mouth every 8 (eight) hours as needed.    Dispense:  30 tablet    Refill:  0    Order Specific Question:   Supervising Provider    Answer:   Merrily Pew     Assessment and Plan  --32 y.o. [phone] at 65w1dby UKoreaperformed 07/06/18 --N/V resolved with meds administered in MAU --Rx B6 and Unisom sent to pharmacy of record as identified above --Greater than 30 minutes discussing dietary revisions (see  AVS) --Discharge home in stable condition  F/U: [facility]  SDarlina Rumpf CElrama8/06/2018, 3:17 PM

## 2018-07-08 NOTE — MAU Note (Signed)
Pt has abd pain and vomiting for 2 days

## 2018-07-08 NOTE — Discharge Instructions (Signed)
Hyperemesis Gravidarum Hyperemesis gravidarum is a severe form of nausea and vomiting that happens during pregnancy. Hyperemesis is worse than morning sickness. It may cause you to have nausea or vomiting all day for many days. It may keep you from eating and drinking enough food and liquids. Hyperemesis usually occurs during the first half (the first 20 weeks) of pregnancy. It often goes away once a woman is in her second half of pregnancy. However, sometimes hyperemesis continues through an entire pregnancy. What are the causes? The cause of this condition is not known. It may be related to changes in chemicals (hormones) in the body during pregnancy, such as the high level of pregnancy hormone (human chorionic gonadotropin) or the increase in the female sex hormone (estrogen). What are the signs or symptoms? Symptoms of this condition include:  Severe nausea and vomiting.  Nausea that does not go away.  Vomiting that does not allow you to keep any food down.  Weight loss.  Body fluid loss (dehydration).  Having no desire to eat, or not liking food that you have previously enjoyed.  How is this diagnosed? This condition may be diagnosed based on:  A physical exam.  Your medical history.  Your symptoms.  Blood tests.  Urine tests.  How is this treated? This condition may be managed with medicine. If medicines to do not help relieve nausea and vomiting, you may need to receive fluids through an IV tube at the hospital. Follow these instructions at home:  Take over-the-counter and prescription medicines only as told by your health care provider.  Avoid iron pills and multivitamins that contain iron for the first 3-4 months of pregnancy. If you take prescription iron pills, do not stop taking them unless your health care provider approves.  Take the following actions to help prevent nausea and vomiting: ? In the morning, before getting out of bed, try eating a couple of dry  crackers or a piece of toast. ? Avoid foods and smells that upset your stomach. Fatty and spicy foods may make nausea worse. ? Eat 5-6 small meals a day. ? Do not drink fluids while eating meals. Drink between meals. ? Eat or suck on things that have ginger in them. Ginger can help relieve nausea. ? Avoid food preparation. The smell of food can spoil your appetite or trigger nausea.  Follow instructions from your health care provider about eating or drinking restrictions.  For snacks, eat high-protein foods, such as cheese.  Keep all follow-up and pre-birth (prenatal) visits as told by your health care provider. This is important. Contact a health care provider if:  You have pain in your abdomen.  You have a severe headache.  You have vision problems.  You are losing weight. Get help right away if:  You cannot drink fluids without vomiting.  You vomit blood.  You have constant nausea and vomiting.  You are very weak.  You are very thirsty.  You feel dizzy.  You faint.  You have a fever or other symptoms that last for more than 2-3 days.  You have a fever and your symptoms suddenly get worse. Summary  Hyperemesis gravidarum is a severe form of nausea and vomiting that happens during pregnancy.  Making some changes to your eating habits may help relieve nausea and vomiting.  This condition may be managed with medicine.  If medicines to do not help relieve nausea and vomiting, you may need to receive fluids through an IV tube at the hospital. This  information is not intended to replace advice given to you by your health care provider. Make sure you discuss any questions you have with your health care provider. Document Released: 11/18/2005 Document Revised: 07/17/2016 Document Reviewed: 07/17/2016 Elsevier Interactive Patient Education  2017 [employer].

## 2018-07-09 ENCOUNTER — Telehealth: Payer: Self-pay | Admitting: *Deleted

## 2018-07-09 NOTE — Telephone Encounter (Signed)
Pt did not show for her appt at [facility] per note in [facility].  07-09-18  AS

## 2018-07-23 ENCOUNTER — Encounter: Payer: Self-pay | Admitting: Advanced Practice Midwife

## 2018-07-23 ENCOUNTER — Ambulatory Visit: Payer: Medicaid Other | Admitting: *Deleted

## 2018-07-23 ENCOUNTER — Ambulatory Visit (INDEPENDENT_AMBULATORY_CARE_PROVIDER_SITE_OTHER): Payer: Medicaid Other | Admitting: Advanced Practice Midwife

## 2018-07-23 VITALS — BP 108/84 | HR 124 | Wt 187.0 lb

## 2018-07-23 DIAGNOSIS — O09212 Supervision of pregnancy with history of pre-term labor, second trimester: Secondary | ICD-10-CM

## 2018-07-23 DIAGNOSIS — O09891 Supervision of other high risk pregnancies, first trimester: Secondary | ICD-10-CM

## 2018-07-23 DIAGNOSIS — Z1389 Encounter for screening for other disorder: Secondary | ICD-10-CM

## 2018-07-23 DIAGNOSIS — Z3A15 15 weeks gestation of pregnancy: Secondary | ICD-10-CM | POA: Diagnosis not present

## 2018-07-23 DIAGNOSIS — O26892 Other specified pregnancy related conditions, second trimester: Secondary | ICD-10-CM

## 2018-07-23 DIAGNOSIS — Z3490 Encounter for supervision of normal pregnancy, unspecified, unspecified trimester: Secondary | ICD-10-CM | POA: Insufficient documentation

## 2018-07-23 DIAGNOSIS — Z331 Pregnant state, incidental: Secondary | ICD-10-CM | POA: Diagnosis not present

## 2018-07-23 DIAGNOSIS — O09211 Supervision of pregnancy with history of pre-term labor, first trimester: Secondary | ICD-10-CM

## 2018-07-23 DIAGNOSIS — Z363 Encounter for antenatal screening for malformations: Secondary | ICD-10-CM

## 2018-07-23 DIAGNOSIS — Z3482 Encounter for supervision of other normal pregnancy, second trimester: Secondary | ICD-10-CM

## 2018-07-23 LAB — POCT URINALYSIS DIPSTICK OB
GLUCOSE, UA: NEGATIVE — AB
LEUKOCYTES UA: NEGATIVE
Nitrite, UA: NEGATIVE
RBC UA: NEGATIVE

## 2018-07-23 MED ORDER — DOXYLAMINE-PYRIDOXINE 10-10 MG PO TBEC: DELAYED_RELEASE_TABLET | ORAL | 3 refills | Status: DC

## 2018-07-23 MED ORDER — GLYCOPYRROLATE 1 MG PO TABS: 2.0000 mg | ORAL_TABLET | Freq: Three times a day (TID) | ORAL | 6 refills | Status: DC

## 2018-07-23 MED ORDER — ONDANSETRON 4 MG PO TBDP: 4.0000 mg | ORAL_TABLET | Freq: Four times a day (QID) | ORAL | 2 refills | Status: DC | PRN

## 2018-07-23 MED ORDER — ESOMEPRAZOLE MAGNESIUM 20 MG PO CPDR: 20.0000 mg | DELAYED_RELEASE_CAPSULE | Freq: Every day | ORAL | 3 refills | Status: DC

## 2018-07-23 NOTE — Patient Instructions (Signed)
If GI provider deems that prednisone is the treatment of choice for Lauren Ramirez's symptoms, it is appropriate to use during pregnancy. Additionally, if a CT scan is deemed necessary, and the benefits to a scan outweigh the risks, it is not contraindicated in pregnancy.     Safe Medications in Pregnancy   Acne: Benzoyl Peroxide Salicylic Acid  Backache/Headache: Tylenol: 2 regular strength every 4 hours OR              2 Extra strength every 6 hours  Colds/Coughs/Allergies: Benadryl (alcohol free) 25 mg every 6 hours as needed Breath right strips Claritin Cepacol throat lozenges Chloraseptic throat spray Cold-Eeze- up to three times per day Cough drops, alcohol free Flonase (by prescription only) Guaifenesin Mucinex Robitussin DM (plain only, alcohol free) Saline nasal spray/drops Sudafed (pseudoephedrine) & Actifed ** use only after [redacted] weeks gestation and if you do not have high blood pressure Tylenol Vicks Vaporub Zinc lozenges Zyrtec   Constipation: Colace Ducolax suppositories Fleet enema Glycerin suppositories Metamucil Milk of magnesia Miralax Senokot Smooth move tea  Diarrhea: Kaopectate Imodium A-D  *NO pepto Bismol  Hemorrhoids: Anusol Anusol HC Preparation H Tucks  Indigestion: Tums Maalox Mylanta Zantac  Pepcid  Insomnia: Benadryl (alcohol free) 41m every 6 hours as needed Tylenol PM Unisom, no Gelcaps  Leg Cramps: Tums MagGel  Nausea/Vomiting:  Bonine Dramamine Emetrol Ginger extract Sea bands Meclizine  Nausea medication to take during pregnancy:  Unisom (doxylamine succinate 25 mg tablets) Take one tablet daily at bedtime. If symptoms are not adequately controlled, the dose can be increased to a maximum recommended dose of two tablets daily (1/2 tablet in the morning, 1/2 tablet mid-afternoon and one at bedtime). Vitamin B6 1077mtablets. Take one tablet twice a day (up to 200 mg per day).  Skin Rashes: Aveeno  products Benadryl cream or 2592mvery 6 hours as needed Calamine Lotion 1% cortisone cream  Yeast infection: Gyne-lotrimin 7 Monistat 7   **If taking multiple medications, please check labels to avoid duplicating the same active ingredients **take medication as directed on the label ** Do not exceed 4000 mg of tylenol in 24 hours **Do not take medications that contain aspirin or ibuprofen     [facility] [employer] greatly value your feedback.  If you receive a survey following your visit with us [facility], we appreciate you taking the time to fill it out.  Thanks, FraNigel BertholdNM     Second Trimester of Pregnancy The second trimester is from week 14 through week 27 (months 4 through 6). The second trimester is often a time when you feel your best. Your body has adjusted to being pregnant, and you begin to feel better physically. Usually, morning sickness has lessened or quit completely, you may have more energy, and you may have an increase in appetite. The second trimester is also a time when the fetus is growing rapidly. At the end of the sixth month, the fetus is about 9 inches long and weighs about 1 pounds. You will likely begin to feel the baby move (quickening) between 16 and 20 weeks of pregnancy. Body changes during your second trimester Your body continues to go through many changes during your second trimester. The changes vary from woman to woman.  Your weight will continue to increase. You will notice your lower abdomen bulging out.  You may begin to get stretch marks on your hips, abdomen, and breasts.  You may develop headaches that can be relieved by medicines. The  medicines should be approved by your health care provider.  You may urinate more often because the fetus is pressing on your bladder.  You may develop or continue to have heartburn as a result of your pregnancy.  You may develop constipation because certain hormones are causing the muscles  that push waste through your intestines to slow down.  You may develop hemorrhoids or swollen, bulging veins (varicose veins).  You may have back pain. This is caused by: ? Weight gain. ? Pregnancy hormones that are relaxing the joints in your pelvis. ? A shift in weight and the muscles that support your balance.  Your breasts will continue to grow and they will continue to become tender.  Your gums may bleed and may be sensitive to brushing and flossing.  Dark spots or blotches (chloasma, mask of pregnancy) may develop on your face. This will likely fade after the baby is born.  A dark line from your belly button to the pubic area (linea nigra) may appear. This will likely fade after the baby is born.  You may have changes in your hair. These can include thickening of your hair, rapid growth, and changes in texture. Some women also have hair loss during or after pregnancy, or hair that feels dry or thin. Your hair will most likely return to normal after your baby is born.  What to expect at prenatal visits During a routine prenatal visit:  You will be weighed to make sure you and the fetus are growing normally.  Your blood pressure will be taken.  Your abdomen will be measured to track your baby's growth.  The fetal heartbeat will be listened to.  Any test results from the previous visit will be discussed.  Your health care provider may ask you:  How you are feeling.  If you are feeling the baby move.  If you have had any abnormal symptoms, such as leaking fluid, bleeding, severe headaches, or abdominal cramping.  If you are using any tobacco products, including cigarettes, chewing tobacco, and electronic cigarettes.  If you have any questions.  Other tests that may be performed during your second trimester include:  Blood tests that check for: ? Low iron levels (anemia). ? High blood sugar that affects pregnant women (gestational diabetes) between 45 and 28  weeks. ? Rh antibodies. This is to check for a protein on red blood cells (Rh factor).  Urine tests to check for infections, diabetes, or protein in the urine.  An ultrasound to confirm the proper growth and development of the baby.  An amniocentesis to check for possible genetic problems.  Fetal screens for spina bifida and Down syndrome.  HIV (human immunodeficiency virus) testing. Routine prenatal testing includes screening for HIV, unless you choose not to have this test.  Follow these instructions at home: Medicines  Follow your health care provider's instructions regarding medicine use. Specific medicines may be either safe or unsafe to take during pregnancy.  Take a prenatal vitamin that contains at least 600 micrograms (mcg) of folic acid.  If you develop constipation, try taking a stool softener if your health care provider approves. Eating and drinking  Eat a balanced diet that includes fresh fruits and vegetables, whole grains, good sources of protein such as meat, eggs, or tofu, and low-fat dairy. Your health care provider will help you determine the amount of weight gain that is right for you.  Avoid raw meat and uncooked cheese. These carry germs that can cause birth defects in  the baby.  If you have low calcium intake from food, talk to your health care provider about whether you should take a daily calcium supplement.  Limit foods that are high in fat and processed sugars, such as fried and sweet foods.  To prevent constipation: ? Drink enough fluid to keep your urine clear or pale yellow. ? Eat foods that are high in fiber, such as fresh fruits and vegetables, whole grains, and beans. Activity  Exercise only as directed by your health care provider. Most women can continue their usual exercise routine during pregnancy. Try to exercise for 30 minutes at least 5 days a week. Stop exercising if you experience uterine contractions.  Avoid heavy lifting, wear low  heel shoes, and practice good posture.  A sexual relationship may be continued unless your health care provider directs you otherwise. Relieving pain and discomfort  Wear a good support bra to prevent discomfort from breast tenderness.  Take warm sitz baths to soothe any pain or discomfort caused by hemorrhoids. Use hemorrhoid cream if your health care provider approves.  Rest with your legs elevated if you have leg cramps or low back pain.  If you develop varicose veins, wear support hose. Elevate your feet for 15 minutes, 3-4 times a day. Limit salt in your diet. Prenatal Care  Write down your questions. Take them to your prenatal visits.  Keep all your prenatal visits as told by your health care provider. This is important. Safety  Wear your seat belt at all times when driving.  Make a list of emergency phone numbers, including numbers for family, friends, the hospital, and police and fire departments. General instructions  Ask your health care provider for a referral to a local prenatal education class. Begin classes no later than the beginning of month 6 of your pregnancy.  Ask for help if you have counseling or nutritional needs during pregnancy. Your health care provider can offer advice or refer you to specialists for help with various needs.  Do not use hot tubs, steam rooms, or saunas.  Do not douche or use tampons or scented sanitary pads.  Do not cross your legs for long periods of time.  Avoid cat litter boxes and soil used by cats. These carry germs that can cause birth defects in the baby and possibly loss of the fetus by miscarriage or stillbirth.  Avoid all smoking, herbs, alcohol, and unprescribed drugs. Chemicals in these products can affect the formation and growth of the baby.  Do not use any products that contain nicotine or tobacco, such as cigarettes and e-cigarettes. If you need help quitting, ask your health care provider.  Visit your dentist if you  have not gone yet during your pregnancy. Use a soft toothbrush to brush your teeth and be gentle when you floss. Contact a health care provider if:  You have dizziness.  You have mild pelvic cramps, pelvic pressure, or nagging pain in the abdominal area.  You have persistent nausea, vomiting, or diarrhea.  You have a bad smelling vaginal discharge.  You have pain when you urinate. Get help right away if:  You have a fever.  You are leaking fluid from your vagina.  You have spotting or bleeding from your vagina.  You have severe abdominal cramping or pain.  You have rapid weight gain or weight loss.  You have shortness of breath with chest pain.  You notice sudden or extreme swelling of your face, hands, ankles, feet, or legs.  You  have not felt your baby move in over an hour.  You have severe headaches that do not go away when you take medicine.  You have vision changes. Summary  The second trimester is from week 14 through week 27 (months 4 through 6). It is also a time when the fetus is growing rapidly.  Your body goes through many changes during pregnancy. The changes vary from woman to woman.  Avoid all smoking, herbs, alcohol, and unprescribed drugs. These chemicals affect the formation and growth your baby.  Do not use any tobacco products, such as cigarettes, chewing tobacco, and e-cigarettes. If you need help quitting, ask your health care provider.  Contact your health care provider if you have any questions. Keep all prenatal visits as told by your health care provider. This is important. This information is not intended to replace advice given to you by your health care provider. Make sure you discuss any questions you have with your health care provider.      CHILDBIRTH CLASSES [phone] is the phone number for Pregnancy Classes or hospital tours at [facility] will be referred to   [email or web address] for more information on childbirth classes  At this site you may register for classes. You may sign up for a waiting list if classes are full. Please SIGN UP FOR THIS!.   When the waiting list becomes long, sometimes new classes can be added.

## 2018-07-23 NOTE — Progress Notes (Signed)
INITIAL OBSTETRICAL VISIT Patient name: Cielle Aguila MRN [id number]  Date of birth: 1986/10/31 Chief Complaint:   Initial Prenatal Visit ("Crohns flare-up", can't eat)  History of Present Illness:   Thera Basden is a 32 y.o. [phone] African American female at 78w2dby  LMP/8 week [place] an Estimated Date of Delivery: 01/12/19 being seen today for her initial obstetrical visit.   Her obstetrical history is significant for preterm birth. x3 (32,36, then 35 weeks w/twins) Was on 17p 2nd pg, not on it 3rd pg d/t twins.  Offered Makena and accepted.  CS w/twins d/t breech.Probably wants a TOLAC.   Today she reports spitting (ptylism), reflux (gags a lot w/clear emesis coming up)  Has CHron's,Had appt w/[facility] yesterday; May be having a flare up now, GI doesn't want to do CT; awaiting stool sample/blood work. Pain is across her upper abdominal region, when "gas and whatever passes through it".  Took prednisone once for the pain which helped a lot. Informed that prednisone was fine for pg, note to GI md written.    Has nausea as well, taking B6, helps some.    Patient's last menstrual period was 04/07/2018. Last pap 02/20/17. Results were: normal Review of Systems:   Pertinent items are noted in HPI Denies cramping/contractions, leakage of fluid, vaginal bleeding, abnormal vaginal discharge w/ itching/odor/irritation, headaches, visual changes, shortness of breath, chest pain,  or problems with urination or bowel movements unless otherwise stated above.  Pertinent History Reviewed:  Reviewed past medical,surgical, social, obstetrical and family history.  Reviewed problem list, medications and allergies. OB History  Gravida Para Term Preterm AB Living  4 3 0 3 0 4  SAB TAB Ectopic Multiple Live Births  0 0 0 1 4    # Outcome Date GA Lbr Len/2nd Weight Sex Delivery Anes PTL Lv  4 Current           3A Preterm 04/14/12 363w0d5 lb 0.6 oz (2.285 kg) M CS-LTranv Spinal  LIV   Birth Comments: preterm appearance c/w 35 wks  3B Preterm 04/14/12 3557w0d lb 13.3 oz (2.645 kg) M CS-LTranv Spinal  LIV     Birth Comments: preterm c/w 35 wks     Complications: Preterm premature rupture of membranes (PPROM) delivered, current hospitalization  2 Preterm 05/07/10 36w77w0dlb 4 oz (2.835 kg) F Vag-Spont EPI Y LIV     Birth Comments: 36wks. 17p injections  1 Preterm 04/27/07 32w053w0db 2 oz (1.417 kg) F Vag-Spont None Y LIV     Birth Comments: 32 wks   Physical Assessment:   Vitals:   07/23/18 1152  BP: 108/84  Pulse: (!) 124  Weight: 187 lb (84.8 kg)  Body mass index is 30.18 kg/m.       Physical Examination:  General appearance - well appearing, and in no distress  Mental status - alert, oriented to person, place, and time  Psych:  She has a normal mood and affect  Skin - warm and dry, normal color, no suspicious lesions noted  Chest - effort normal, all lung fields clear to auscultation bilaterally  Heart - normal rate and regular rhythm  Abdomen - soft, nontender  Extremities:  No swelling or varicosities noted  Pelvic - VULVA: normal appearing vulva with no masses, tenderness or lesions  VAGINA: normal appearing vagina with normal color and discharge, no lesions  CERVIX: normal appearing cervix without discharge or lesions, no CMT  Results for orders placed or performed in visit on 07/23/18 (from the past 24 hour(s))  POC Urinalysis Dipstick OB   Collection Time: 07/23/18 12:05 PM  Result Value Ref Range   Color, UA     Clarity, UA     Glucose, UA Negative (A) (none)   Bilirubin, UA     Ketones, UA large    Spec Grav, UA     Blood, UA neg    pH, UA     POC Protein UA Small (1+) (A) Negative, Trace   Urobilinogen, UA     Nitrite, UA neg    Leukocytes, UA Negative Negative   Appearance     Odor      Assessment & Plan:  1) Low-Risk Pregnancy [id number] at 84w2dwith an Estimated Date of Delivery: 01/12/19   2) Initial OB visit  3) Chron's disease,  no meds, seeing GI currently  4)  Ptylism:  rx robinul  Meds:  Meds ordered this encounter  Medications  . esomeprazole (NEXIUM) 20 MG capsule    Sig: Take 1 capsule (20 mg total) by mouth daily at 12 noon.    Dispense:  30 capsule    Refill:  3    Order Specific Question:   Supervising Provider    Answer:   EElonda Husky LUTHER H [[id number]]  . Doxylamine-Pyridoxine 10-10 MG TBEC    Sig: 2 PO qhs; may take 1po in am and 1po in afternoon prn nausea    Dispense:  120 tablet    Refill:  3    Order Specific Question:   Supervising Provider    Answer:   EElonda Husky LUTHER H [[id number]]  . glycopyrrolate (ROBINUL) 1 MG tablet    Sig: Take 2 tablets (2 mg total) by mouth 3 (three) times daily.    Dispense:  90 tablet    Refill:  6    Order Specific Question:   Supervising Provider    Answer:   EElonda Husky LUTHER H [[id number]]  . ondansetron (ZOFRAN ODT) 4 MG disintegrating tablet    Sig: Take 1 tablet (4 mg total) by mouth every 6 (six) hours as needed for nausea.    Dispense:  30 tablet    Refill:  2    Order Specific Question:   Supervising Provider    Answer:   ETania AdeH [[id number]]    Initial labs obtained Continue prenatal vitamins Reviewed n/v relief measures and warning s/s to report Reviewed recommended weight gain based on pre-gravid BMI Encouraged well-balanced diet Genetic Screening discussed First Screen, Integrated Screen and Quad Screen: declined Cystic fibrosis screening discussed declined Ultrasound discussed; fetal survey: requested CCNC completed  Follow-up: Return for 17P Weekly; 3 weeks for anatomy scan/LROB.   Orders Placed This Encounter  Procedures  . GC/Chlamydia Probe Amp  . Urine Culture  . [place] Comp + 14 Wk  . Urinalysis, Routine w reflex microscopic  . Obstetric Panel, Including HIV  . Pain Management Screening Profile (10S)  . POC Urinalysis Dipstick OB    FChristin FudgeCNM, WSouth Shore [facility] Inc8/22/2019 4:07 PM

## 2018-07-24 LAB — OBSTETRIC PANEL, INCLUDING HIV
ANTIBODY SCREEN: NEGATIVE
BASOS: 0 %
Basophils Absolute: 0 10*3/uL (ref 0.0–0.2)
EOS (ABSOLUTE): 0.1 10*3/uL (ref 0.0–0.4)
EOS: 1 %
HEMATOCRIT: 36.7 % (ref 34.0–46.6)
HEMOGLOBIN: 12.5 g/dL (ref 11.1–15.9)
HIV SCREEN 4TH GENERATION: NONREACTIVE
Hepatitis B Surface Ag: NEGATIVE
IMMATURE GRANS (ABS): 0 10*3/uL (ref 0.0–0.1)
Immature Granulocytes: 0 %
LYMPHS ABS: 1.2 10*3/uL (ref 0.7–3.1)
LYMPHS: 17 %
MCH: 28.5 pg (ref 26.6–33.0)
MCHC: 34.1 g/dL (ref 31.5–35.7)
MCV: 84 fL (ref 79–97)
MONOS ABS: 0.6 10*3/uL (ref 0.1–0.9)
Monocytes: 9 %
NEUTROS ABS: 5 10*3/uL (ref 1.4–7.0)
Neutrophils: 73 %
Platelets: 460 10*3/uL — ABNORMAL HIGH (ref 150–450)
RBC: 4.38 x10E6/uL (ref 3.77–5.28)
RDW: 13.2 % (ref 12.3–15.4)
RPR Ser Ql: NONREACTIVE
Rh Factor: POSITIVE
Rubella Antibodies, IGG: 1.53 index (ref >0.99)
WBC: 6.8 10*3/uL (ref 3.4–10.8)

## 2018-07-24 LAB — URINALYSIS, ROUTINE W REFLEX MICROSCOPIC
Bilirubin, UA: NEGATIVE
Glucose, UA: NEGATIVE
NITRITE UA: NEGATIVE
RBC, UA: NEGATIVE
Urobilinogen, Ur: 1 mg/dL (ref 0.2–1.0)
pH, UA: 6 (ref 5.0–7.5)

## 2018-07-24 LAB — MICROSCOPIC EXAMINATION
CASTS: NONE SEEN /LPF
Epithelial Cells (non renal): >10 /hpf — AB (ref 0–10)

## 2018-07-25 LAB — URINE CULTURE

## 2018-07-25 LAB — GC/CHLAMYDIA PROBE AMP
CHLAMYDIA, DNA PROBE: NEGATIVE
Neisseria gonorrhoeae by PCR: NEGATIVE

## 2018-07-27 ENCOUNTER — Telehealth: Payer: Self-pay | Admitting: Advanced Practice Midwife

## 2018-07-27 ENCOUNTER — Other Ambulatory Visit: Payer: Self-pay | Admitting: Gastroenterology

## 2018-07-27 DIAGNOSIS — R110 Nausea: Secondary | ICD-10-CM

## 2018-07-27 DIAGNOSIS — K5090 Crohn's disease, unspecified, without complications: Secondary | ICD-10-CM

## 2018-07-27 DIAGNOSIS — R1084 Generalized abdominal pain: Secondary | ICD-10-CM

## 2018-07-27 NOTE — Telephone Encounter (Signed)
Patient called stating that she would like a call back from a nurse because she seen a gastro doctor and he wants to place her on a medication but they would like to know if it is safe since she is pregnant. Please contact pt

## 2018-07-27 NOTE — Telephone Encounter (Signed)
Patient states she is possibly going to start Pentasa but is going to have an MRI first.  Advised to let us know if she starts to verify ok during pregnancy.

## 2018-07-28 ENCOUNTER — Ambulatory Visit: Payer: Medicaid Other

## 2018-07-28 ENCOUNTER — Emergency Department (HOSPITAL_COMMUNITY): Payer: Medicaid Other

## 2018-07-28 ENCOUNTER — Inpatient Hospital Stay (HOSPITAL_COMMUNITY)
Admission: EM | Admit: 2018-07-28 | Discharge: 2018-07-30 | DRG: 832 | Disposition: A | Discharge status: Home / Self Care | Payer: Medicaid Other | Attending: Family Medicine | Admitting: Family Medicine

## 2018-07-28 ENCOUNTER — Encounter (HOSPITAL_COMMUNITY): Payer: Self-pay | Admitting: Emergency Medicine

## 2018-07-28 ENCOUNTER — Other Ambulatory Visit: Payer: Self-pay

## 2018-07-28 DIAGNOSIS — Z3A16 16 weeks gestation of pregnancy: Secondary | ICD-10-CM

## 2018-07-28 DIAGNOSIS — Z8249 Family history of ischemic heart disease and other diseases of the circulatory system: Secondary | ICD-10-CM

## 2018-07-28 DIAGNOSIS — K5080 Crohn's disease of both small and large intestine without complications: Secondary | ICD-10-CM | POA: Diagnosis present

## 2018-07-28 DIAGNOSIS — E871 Hypo-osmolality and hyponatremia: Secondary | ICD-10-CM | POA: Diagnosis present

## 2018-07-28 DIAGNOSIS — E860 Dehydration: Secondary | ICD-10-CM | POA: Diagnosis present

## 2018-07-28 DIAGNOSIS — K50919 Crohn's disease, unspecified, with unspecified complications: Secondary | ICD-10-CM | POA: Diagnosis present

## 2018-07-28 DIAGNOSIS — K5090 Crohn's disease, unspecified, without complications: Secondary | ICD-10-CM | POA: Diagnosis present

## 2018-07-28 DIAGNOSIS — Z87891 Personal history of nicotine dependence: Secondary | ICD-10-CM

## 2018-07-28 DIAGNOSIS — K50918 Crohn's disease, unspecified, with other complication: Secondary | ICD-10-CM | POA: Diagnosis not present

## 2018-07-28 DIAGNOSIS — O99282 Endocrine, nutritional and metabolic diseases complicating pregnancy, second trimester: Secondary | ICD-10-CM | POA: Diagnosis present

## 2018-07-28 DIAGNOSIS — O99612 Diseases of the digestive system complicating pregnancy, second trimester: Principal | ICD-10-CM | POA: Diagnosis present

## 2018-07-28 LAB — COMPREHENSIVE METABOLIC PANEL
ALK PHOS: 76 U/L (ref 38–126)
ALT: 9 U/L (ref 0–44)
ANION GAP: 12 (ref 5–15)
AST: 11 U/L — ABNORMAL LOW (ref 15–41)
Albumin: 2.4 g/dL — ABNORMAL LOW (ref 3.5–5.0)
BILIRUBIN TOTAL: 1.1 mg/dL (ref 0.3–1.2)
BUN: <5 mg/dL — ABNORMAL LOW (ref 6–20)
CALCIUM: 9 mg/dL (ref 8.9–10.3)
CO2: 21 mmol/L — ABNORMAL LOW (ref 22–32)
CREATININE: 0.57 mg/dL (ref 0.44–1.00)
Chloride: 101 mmol/L (ref 98–111)
Glucose, Bld: 100 mg/dL — ABNORMAL HIGH (ref 70–99)
Potassium: 3.8 mmol/L (ref 3.5–5.1)
SODIUM: 134 mmol/L — AB (ref 135–145)
TOTAL PROTEIN: 6.9 g/dL (ref 6.5–8.1)

## 2018-07-28 LAB — CBC
HCT: 35.8 % — ABNORMAL LOW (ref 36.0–46.0)
Hemoglobin: 11.7 g/dL — ABNORMAL LOW (ref 12.0–15.0)
MCH: 27.8 pg (ref 26.0–34.0)
MCHC: 32.7 g/dL (ref 30.0–36.0)
MCV: 85 fL (ref 78.0–100.0)
PLATELETS: 431 10*3/uL — AB (ref 150–400)
RBC: 4.21 MIL/uL (ref 3.87–5.11)
RDW: 11.9 % (ref 11.5–15.5)
WBC: 7.5 10*3/uL (ref 4.0–10.5)

## 2018-07-28 LAB — I-STAT CG4 LACTIC ACID, ED
LACTIC ACID, VENOUS: 0.77 mmol/L (ref 0.5–1.9)
LACTIC ACID, VENOUS: 0.88 mmol/L (ref 0.5–1.9)

## 2018-07-28 LAB — URINALYSIS, ROUTINE W REFLEX MICROSCOPIC
BILIRUBIN URINE: NEGATIVE
Glucose, UA: NEGATIVE mg/dL
Hgb urine dipstick: NEGATIVE
Ketones, ur: 80 mg/dL — AB
Leukocytes, UA: NEGATIVE
NITRITE: NEGATIVE
PROTEIN: NEGATIVE mg/dL
SPECIFIC GRAVITY, URINE: 1.017 (ref 1.005–1.030)
pH: 6 (ref 5.0–8.0)

## 2018-07-28 LAB — LIPASE, BLOOD: Lipase: 29 U/L (ref 11–51)

## 2018-07-28 MED ORDER — GLYCOPYRROLATE 1 MG PO TABS
2.0000 mg | ORAL_TABLET | Freq: Three times a day (TID) | ORAL | Status: DC
  Filled 2018-07-28 (×3): qty 2

## 2018-07-28 MED ORDER — SODIUM CHLORIDE 0.9 % IV SOLN
1000.0000 mL | INTRAVENOUS | Status: DC
  Administered 2018-07-28: 1000 mL via INTRAVENOUS

## 2018-07-28 MED ORDER — METHYLPREDNISOLONE SODIUM SUCC 125 MG IJ SOLR
125.0000 mg | Freq: Once | INTRAMUSCULAR | Status: AC
  Administered 2018-07-28: 125 mg via INTRAVENOUS
  Filled 2018-07-28: qty 2

## 2018-07-28 MED ORDER — METHYLPREDNISOLONE SODIUM SUCC 125 MG IJ SOLR
125.0000 mg | Freq: Four times a day (QID) | INTRAMUSCULAR | Status: DC
  Administered 2018-07-29 (×2): 125 mg via INTRAVENOUS
  Filled 2018-07-28 (×2): qty 2

## 2018-07-28 MED ORDER — DEXTROSE-NACL 5-0.9 % IV SOLN
INTRAVENOUS | Status: DC
  Administered 2018-07-29 – 2018-07-30 (×4): via INTRAVENOUS

## 2018-07-28 MED ORDER — ACETAMINOPHEN 325 MG PO TABS: 650.0000 mg | ORAL_TABLET | Freq: Once | ORAL | Status: DC

## 2018-07-28 MED ORDER — SODIUM CHLORIDE 0.9 % IV BOLUS (SEPSIS)
1000.0000 mL | Freq: Once | INTRAVENOUS | Status: AC
  Administered 2018-07-28: 1000 mL via INTRAVENOUS

## 2018-07-28 MED ORDER — DOXYLAMINE-PYRIDOXINE 10-10 MG PO TBEC
1.0000 | DELAYED_RELEASE_TABLET | ORAL | Status: DC
Start: 2018-07-28 — End: 2018-07-28

## 2018-07-28 MED ORDER — ONDANSETRON HCL 4 MG/2ML IJ SOLN: 4.0000 mg | Freq: Four times a day (QID) | INTRAMUSCULAR | Status: DC | PRN

## 2018-07-28 MED ORDER — MORPHINE SULFATE (PF) 4 MG/ML IV SOLN
4.0000 mg | Freq: Once | INTRAVENOUS | Status: AC
  Administered 2018-07-28: 4 mg via INTRAVENOUS
  Filled 2018-07-28: qty 1

## 2018-07-28 MED ORDER — ONDANSETRON HCL 4 MG PO TABS: 4.0000 mg | ORAL_TABLET | Freq: Four times a day (QID) | ORAL | Status: DC | PRN

## 2018-07-28 MED ORDER — PANTOPRAZOLE SODIUM 40 MG PO TBEC
40.0000 mg | DELAYED_RELEASE_TABLET | Freq: Every day | ORAL | Status: DC
  Filled 2018-07-28 (×2): qty 1

## 2018-07-28 MED ORDER — BARIUM SULFATE 0.1 % PO SUSP
ORAL | Status: AC
  Filled 2018-07-28: qty 2

## 2018-07-28 NOTE — ED Triage Notes (Signed)
Pt arrives to ED with mid abdomen pain and n/v/d. Pt is [redacted] weeks pregnant. Pt reports she seen her GI MD because she thinks this is a "cnhrons flare". Pt states she has bene unable to eat due to pain and nausea.

## 2018-07-28 NOTE — ED Notes (Signed)
Pt notified we need urine, "stated she is unable to go now because she hasn't drank anything in a while". EMT Cortez Flippen told her she will come back in 10-20 minutes and/or to hit call back and will assist her to bathroom.

## 2018-07-28 NOTE — ED Notes (Signed)
Pt c/o IV in right AC, flushed and drew back blood, per patients request NS bolus rates turned down.

## 2018-07-28 NOTE — ED Provider Notes (Signed)
[facility] EMERGENCY DEPARTMENT Provider Note   CSN: [id number] Arrival date & time: 07/28/18  1322     History   Chief Complaint Chief Complaint  Patient presents with  . Abdominal Pain  . Nausea    HPI Lauren Ramirez is a 32 y.o. female.  HPI Pt is g p at 16 weeks pregnancy.  Pt has been having issues with abdominal pains for several weeks.  Pt saw her GI doctor recently for this.  He did some outpatient lab tests.  She had an MRI ordered and wanted to start her on pentasa.  That medication required preauthorization.  SHe was started on another medication that starts with a B but that medication is not available yet either.  Pt came to the ED because she feels like she is getitng worse.  She is weak and dehydrated.  Pt has been losing weight.  She has not been eating much.  SHe has not been having diarrhea.  Last bowel movement was a couple of days ago.  She has been gagging but not vomiting frequently.  She is currently taking nexium.  She is taking on dicylgegis and zofran for nausea.  Her gi doctor discussed starting steroids but wanted to see the MRI results first.   [facility] Oletta Lamas Past Medical History:  Diagnosis Date  . Abnormal Pap smear    ASCUS 2011  . Crohn's disease ([place])   . General counseling and advice for contraceptive management 09/06/2014  . Nexplanon in place 09/06/2014  . Vaginal Pap smear, abnormal     Patient Active Problem List   Diagnosis Date Noted  . Exacerbation of Crohn's disease ([place]) 07/28/2018  . Hyponatremia 07/28/2018  . [redacted] weeks gestation of pregnancy 07/28/2018  . Supervision of normal pregnancy 07/23/2018  . History of preterm delivery, currently pregnant in first trimester 05/26/2018  . History of cesarean section 05/26/2018  . History of Crohn's disease 05/26/2018    Past Surgical History:  Procedure Laterality Date  . CESAREAN SECTION  04/14/2012   Procedure: CESAREAN SECTION;   Surgeon: Osborne Oman, MD;  Location: [facility];  Service: Gynecology;  Laterality: N/A;  primary  . DILATION AND CURETTAGE OF UTERUS    . SMALL INTESTINE SURGERY       OB History    Gravida  4   Para  3   Term  0   Preterm  3   AB  0   Living  4     SAB  0   TAB  0   Ectopic  0   Multiple  1   Live Births  4            Home Medications    Prior to Admission medications   Medication Sig Start Date End Date Taking? Authorizing Provider  Doxylamine-Pyridoxine 10-10 MG TBEC 2 PO qhs; may take 1po in am and 1po in afternoon prn nausea Patient taking differently: Take 1-2 tablets by mouth See admin instructions. Take 2 tablets by mouth at bedtime and may also take 1 tablet in the morning and 1 tablet in the afternoon as needed for nausea 07/23/18  Yes Cresenzo-Dishmon, Joaquim Lai, CNM  esomeprazole (NEXIUM) 20 MG capsule Take 1 capsule (20 mg total) by mouth daily at 12 noon. 07/23/18  Yes Cresenzo-Dishmon, Joaquim Lai, CNM  glycopyrrolate (ROBINUL) 1 MG tablet Take 2 tablets (2 mg total) by mouth 3 (three) times daily. 07/23/18  Yes Cresenzo-Dishmon, Joaquim Lai, CNM  ondansetron (ZOFRAN ODT) 4 MG disintegrating tablet Take 1 tablet (4 mg total) by mouth every 6 (six) hours as needed for nausea. 07/23/18  Yes Cresenzo-Dishmon, Joaquim Lai, CNM  folic acid (FOLVITE) 1 MG tablet Take 1 tablet (1 mg total) by mouth daily. Patient not taking: Reported on 07/28/2018 05/26/18   Derrek Monaco A, NP  pyridOXINE (VITAMIN B-6) 25 MG tablet Take 1 tablet (25 mg total) by mouth every 8 (eight) hours. Patient not taking: Reported on 07/28/2018 07/08/18   Darlina Rumpf, CNM    Family History Family History  Problem Relation Age of Onset  . Hypertension Mother   . Hypertension Maternal Grandmother   . Diabetes Maternal Grandmother   . Cancer Maternal Grandfather   . Anesthesia problems Neg Hx     Social History Social History   Tobacco Use  . Smoking status: Former Smoker     Packs/day: 0.00    Years: 7.00    Pack years: 0.00    Types: Cigarettes    Last attempt to quit: 04/15/2014    Years since quitting: 4.2  . Smokeless tobacco: Never Used  . Tobacco comment: smokes 4-5 cig daily  Substance Use Topics  . Alcohol use: Not Currently    Comment: only occasionally; before pregnancy  . Drug use: No     Allergies   Patient has no known allergies.   Review of Systems Review of Systems  All other systems reviewed and are negative.    Physical Exam Updated Vital Signs BP 114/83 (BP Location: Left Arm)   Pulse 85   Temp 97.6 F (36.4 C)   Resp 17   Ht 1.676 m (5' 5.98")   Wt 84.6 kg   LMP 04/07/2018   SpO2 100%   BMI 30.12 kg/m   Physical Exam  Constitutional: She appears well-developed and well-nourished. No distress.  HENT:  Head: Normocephalic and atraumatic.  Right Ear: External ear normal.  Left Ear: External ear normal.  Eyes: Conjunctivae are normal. Right eye exhibits no discharge. Left eye exhibits no discharge. No scleral icterus.  Neck: Neck supple. No tracheal deviation present.  Cardiovascular: Normal rate, regular rhythm and intact distal pulses.  Pulmonary/Chest: Effort normal and breath sounds normal. No stridor. No respiratory distress. She has no wheezes. She has no rales.  Abdominal: Soft. Bowel sounds are normal. She exhibits no distension. There is generalized tenderness. There is no rebound and no guarding.  Musculoskeletal: She exhibits no edema or tenderness.  Neurological: She is alert. She has normal strength. No cranial nerve deficit (no facial droop, extraocular movements intact, no slurred speech) or sensory deficit. She exhibits normal muscle tone. She displays no seizure activity. Coordination normal.  Skin: Skin is warm and dry. No rash noted.  Psychiatric: She has a normal mood and affect.  Nursing note and vitals reviewed.    ED Treatments / Results  Labs (all labs ordered are listed, but only abnormal  results are displayed) Labs Reviewed  COMPREHENSIVE METABOLIC PANEL - Abnormal; Notable for the following components:      Result Value   Sodium 134 (*)    CO2 21 (*)    Glucose, Bld 100 (*)    BUN <5 (*)    Albumin 2.4 (*)    AST 11 (*)    All other components within normal limits  CBC - Abnormal; Notable for the following components:   Hemoglobin 11.7 (*)    HCT 35.8 (*)    Platelets 431 (*)  All other components within normal limits  URINALYSIS, ROUTINE W REFLEX MICROSCOPIC - Abnormal; Notable for the following components:   APPearance HAZY (*)    Ketones, ur 80 (*)    All other components within normal limits  COMPREHENSIVE METABOLIC PANEL - Abnormal; Notable for the following components:   Sodium 132 (*)    CO2 20 (*)    Glucose, Bld 166 (*)    BUN <5 (*)    Calcium 8.4 (*)    Total Protein 6.2 (*)    Albumin 2.0 (*)    AST 9 (*)    All other components within normal limits  CBC - Abnormal; Notable for the following components:   RBC 3.68 (*)    Hemoglobin 10.2 (*)    HCT 31.0 (*)    All other components within normal limits  LIPASE, BLOOD  HIV ANTIBODY (ROUTINE TESTING)  HEPATITIS B SURFACE ANTIGEN  QUANTIFERON-TB GOLD PLUS  I-STAT CG4 LACTIC ACID, ED  I-STAT CG4 LACTIC ACID, ED     Radiology Mr Abdomen Wo Contrast  Result Date: 07/29/2018 CLINICAL DATA:  Second trimester pregnancy, history of Crohn's disease. Dyspepsia and gagging sensation. EXAM: MRI ABDOMEN AND PELVIS WITHOUT CONTRAST TECHNIQUE: Multiplanar multisequence MR imaging of the abdomen and pelvis was performed. No intravenous contrast was administered. COMPARISON:  CT scan from 04/17/2017 FINDINGS: COMBINED FINDINGS FOR BOTH MR ABDOMEN AND PELVIS Lower chest: Unremarkable where included. Hepatobiliary: There are about 5 gallstones ranging in size up to about 10 mm in diameter. No significant degree of gallbladder wall thickening or pericholecystic fluid. No biliary dilatation or appreciable  choledocholithiasis. Hepatic parenchyma unremarkable where included. Pancreas:  Unremarkable Spleen:  Unremarkable Adrenals/Urinary Tract: Mild right hydroureter extending to the iliac vessel cross over where the ureter appears mildly compressed between the gravid uterus and the right iliac vasculature. Borderline right hydronephrosis. Adrenal glands unremarkable. Stomach/Bowel: Prominent wall thickening in the distal 9 cm loop of distal ileum extending to the ileo colic anastomosis. Extensive surrounding edema and inflammatory phlegmon in this vicinity, with a 5.4 by 3.5 cm phlegmon or region of incipient abscess shown posterior to the inflamed loop of bowel on image 64/27. Trace adjacent free fluid in the right paracolic gutter. There is likely secondary inflammation of the ascending colon adjacent to this phlegmon. There are dilated loops of small bowel extending to the inflamed loop. The dilated small bowel is up to 5.7 cm in diameter. Unusual configuration of the transverse colon as before, tracking up into the gastrohepatic space and somewhat posterior in position Vascular/Lymphatic: Scattered reactive mesenteric and porta hepatis lymph nodes are present. Reactive external iliac lymph nodes on the right. Reproductive: Gravid uterus noted with single intrauterine fetus and posterior placenta without visualized previa. Today's exam was not optimized for fetal assessment. Amount of amniotic fluid appears appropriate. Other:  No supplemental non-categorized findings. Musculoskeletal: Unremarkable IMPRESSION: 1. Prominent wall thickening and inflammation of the terminal 9 cm of the small bowel extending to the ileo colic anastomosis compatible with active Crohn's disease. Adjacent 5.4 by 3.5 cm region of phlegmon or incipient abscess formation posterior to the inflamed loop of bowel. I am skeptical that this is a true drainable abscess at this point. Notable surrounding inflammatory findings and trace fluid in the  right paracolic gutter. 2. Notable dilatation of the small bowel proximal to this inflamed loop, up to 5.7 cm in diameter. 3. Somewhat unusual posterior positioning of the transverse colon which extends up into the gastrohepatic space, as on the  prior CT scan. 4. Mild right hydroureter extending down to the iliac vessel where the ureter appears mildly compress by the gravid uterus. Borderline right hydronephrosis. 5. Cholelithiasis. 6. No appreciable fetal complication although today's exam was not protocolled for fetal assessment. Electronically Signed   By: Van Clines M.D.   On: 07/29/2018 08:10    Procedures Procedures (including critical care time)  Medications Ordered in ED Medications  barium (VOLUMEN) 0.1 % suspension (has no administration in time range)  acetaminophen (TYLENOL) tablet 650 mg (650 mg Oral Not Given 07/28/18 2133)  pantoprazole (PROTONIX) EC tablet 40 mg (40 mg Oral Not Given 07/29/18 1054)  glycopyrrolate (ROBINUL) tablet 2 mg (2 mg Oral Not Given 07/29/18 1054)  dextrose 5 %-0.9 % sodium chloride infusion ( Intravenous New Bag/Given 07/29/18 1057)  ondansetron (ZOFRAN) tablet 4 mg (has no administration in time range)    Or  ondansetron (ZOFRAN) injection 4 mg (has no administration in time range)  methylPREDNISolone sodium succinate (SOLU-MEDROL) 40 mg/mL injection 40 mg (40 mg Intravenous Given 07/29/18 1054)  sodium chloride 0.9 % bolus 1,000 mL (0 mLs Intravenous Stopped 07/28/18 1738)    Followed by  sodium chloride 0.9 % bolus 1,000 mL (0 mLs Intravenous Stopped 07/28/18 1738)  morphine 4 MG/ML injection 4 mg (4 mg Intravenous Given 07/28/18 2042)  methylPREDNISolone sodium succinate (SOLU-MEDROL) 125 mg/2 mL injection 125 mg (125 mg Intravenous Given 07/28/18 2130)     Initial Impression / Assessment and Plan / ED Course  I have reviewed the triage vital signs and the nursing notes.  Pertinent labs & imaging results that were available during my care of the  patient were reviewed by me and considered in my medical decision making (see chart for details).  Clinical Course as of Jul 30 1107  Tue Jul 28, 2018  1859 MRI prelim.  Gravid uterus as expected.  Crohns flare distal ileum, similar area compared to May. Thickened loop ilocolonic anastamosis. Significant proximal small bowel dilitation.     [JK]  2039 Attempting to page [facility]   [JK]    Clinical Course User Index [JK] Dorie Rank, MD    Pt presented to the ED with persistent nausea, abdominal pain at [redacted] weeks ega with known history of crohns. Decreased po intake and weight loss. PT has seen her GI doctor.  Unfortunately not able to start her pentasa due to insurance approval issues.  MRI ordered as an outpatient by her GI MD.  Sx worsening.  MRI consistent with her crohns exacerbation with thickening of bowel and her anastamosis site.  Consulted with Dr Michail Sermon.  Started on IV steroid therapy.  Admit for further treatment, stabilization.  Final Clinical Impressions(s) / ED Diagnoses   Final diagnoses:  Exacerbation of Crohn's disease with complication [facility])       Dorie Rank, MD 07/29/18 1112

## 2018-07-28 NOTE — H&P (Signed)
History and Physical    Lauren Ramirez VOJ:[id number] DOB: 02/22/1986 DOA: 07/28/2018  Referring MD/NP/PA: Dr. Dorie Rank  PCP: Patient, No Pcp Per   Outpatient Specialists: Dr. Oletta Lamas gastroenterology   Patient coming from: Home  Chief Complaint: Abdominal pain and anorexia  HPI: Lauren Ramirez is a 32 y.o. female with medical history significant of Crohn's disease who is [redacted] weeks pregnant presenting with persistent nausea but no vomiting, anorexia and intermittent abdominal pain.  Patient is unable to eat and has been having difficulty functioning.  She has had these symptoms improve the past when she was having flareup of her Crohn's disease.  She has been followed by Dr. Oletta Lamas who tried to get her Pentasa in the outpatient setting but she could not afford it.  Patient came to the ER with persistent symptoms.  She is pregnant with her fifth child and still in the early trimesters.  Patient had MRI of the abdomen the confirmed flareup of her Crohn's disease.  GI was consulted and recommended inpatient admission for IV steroids.  She will be seen by GI in the morning.  ED Course: Patient's vitals include temperature of 99.6, blood pressure 128/90, pulse 125, respiratory of 17 and oxygen sat 100% room air.  She has a white count of 7.5, hemoglobin 11.7 sodium 134 potassium 3.8 CO2 21 creatinine 0.57 and glucose of 100.  Platelet is 431.  Urinalysis is negative.  MRI of the abdomen showed findings consistent with flareup of Crohn's disease.  Review of Systems: As per HPI otherwise 10 point review of systems negative.    Past Medical History:  Diagnosis Date  . Abnormal Pap smear    ASCUS 2011  . Crohn's disease ([place])   . General counseling and advice for contraceptive management 09/06/2014  . Nexplanon in place 09/06/2014  . Vaginal Pap smear, abnormal     Past Surgical History:  Procedure Laterality Date  . CESAREAN SECTION  04/14/2012   Procedure: CESAREAN SECTION;   Surgeon: Osborne Oman, MD;  Location: [facility];  Service: Gynecology;  Laterality: N/A;  primary  . DILATION AND CURETTAGE OF UTERUS    . SMALL INTESTINE SURGERY       reports that she quit smoking about 4 years ago. Her smoking use included cigarettes. She smoked 0.00 packs per day for 7.00 years. She has never used smokeless tobacco. She reports that she drank alcohol. She reports that she does not use drugs.  No Known Allergies  Family History  Problem Relation Age of Onset  . Hypertension Mother   . Hypertension Maternal Grandmother   . Diabetes Maternal Grandmother   . Cancer Maternal Grandfather   . Anesthesia problems Neg Hx    Unacceptable: Noncontributory, unremarkable, or negative. Acceptable: Family history reviewed and not pertinent (If you reviewed it)  Prior to Admission medications   Medication Sig Start Date End Date Taking? Authorizing Provider  Doxylamine-Pyridoxine 10-10 MG TBEC 2 PO qhs; may take 1po in am and 1po in afternoon prn nausea Patient taking differently: Take 1-2 tablets by mouth See admin instructions. Take 2 tablets by mouth at bedtime and may also take 1 tablet in the morning and 1 tablet in the afternoon as needed for nausea 07/23/18  Yes Cresenzo-Dishmon, Joaquim Lai, CNM  esomeprazole (NEXIUM) 20 MG capsule Take 1 capsule (20 mg total) by mouth daily at 12 noon. 07/23/18  Yes Cresenzo-Dishmon, Joaquim Lai, CNM  glycopyrrolate (ROBINUL) 1 MG tablet Take 2 tablets (2 mg total) by mouth  3 (three) times daily. 07/23/18  Yes Cresenzo-Dishmon, Joaquim Lai, CNM  ondansetron (ZOFRAN ODT) 4 MG disintegrating tablet Take 1 tablet (4 mg total) by mouth every 6 (six) hours as needed for nausea. 07/23/18  Yes Cresenzo-Dishmon, Joaquim Lai, CNM  folic acid (FOLVITE) 1 MG tablet Take 1 tablet (1 mg total) by mouth daily. Patient not taking: Reported on 07/28/2018 05/26/18   Derrek Monaco A, NP  pyridOXINE (VITAMIN B-6) 25 MG tablet Take 1 tablet (25 mg total) by mouth every 8  (eight) hours. Patient not taking: Reported on 07/28/2018 07/08/18   Darlina Rumpf, CNM    Physical Exam: Vitals:   07/28/18 1715 07/28/18 2045 07/28/18 2115 07/28/18 2145  BP: 116/90 123/80 124/90 112/80  Pulse: 92 (!) 102 (!) 104 97  Resp:      Temp:      TempSrc:      SpO2: 100% 99% 99% 98%      Constitutional: NAD, calm, comfortable Vitals:   07/28/18 1715 07/28/18 2045 07/28/18 2115 07/28/18 2145  BP: 116/90 123/80 124/90 112/80  Pulse: 92 (!) 102 (!) 104 97  Resp:      Temp:      TempSrc:      SpO2: 100% 99% 99% 98%   Eyes: PERRL, lids and conjunctivae normal ENMT: Mucous membranes are moist. Posterior pharynx clear of any exudate or lesions.Normal dentition.  Neck: normal, supple, no masses, no thyromegaly Respiratory: clear to auscultation bilaterally, no wheezing, no crackles. Normal respiratory effort. No accessory muscle use.  Cardiovascular: Regular rate and rhythm, no murmurs / rubs / gallops. No extremity edema. 2+ pedal pulses. No carotid bruits.  Abdomen: Distended, consistent with 16 weeks pregnancy, mild tenderness,, no masses palpated. No hepatosplenomegaly. Bowel sounds positive.  Musculoskeletal: no clubbing / cyanosis. No joint deformity upper and lower extremities. Good ROM, no contractures. Normal muscle tone.  Skin: no rashes, lesions, ulcers. No induration Neurologic: CN 2-12 grossly intact. Sensation intact, DTR normal. Strength 5/5 in all 4.  Psychiatric: Normal judgment and insight. Alert and oriented x 3. Normal mood.   Labs on Admission: I have personally reviewed following labs and imaging studies  CBC: Recent Labs  Lab 07/23/18 1255 07/28/18 1402  WBC 6.8 7.5  NEUTROABS 5.0  --   HGB 12.5 11.7*  HCT 36.7 35.8*  MCV 84 85.0  PLT 460* 219*   Basic Metabolic Panel: Recent Labs  Lab 07/28/18 1402  NA 134*  K 3.8  CL 101  CO2 21*  GLUCOSE 100*  BUN <5*  CREATININE 0.57  CALCIUM 9.0   GFR: Estimated Creatinine Clearance:  112.8 mL/min (by C-G formula based on SCr of 0.57 mg/dL). Liver Function Tests: Recent Labs  Lab 07/28/18 1402  AST 11*  ALT 9  ALKPHOS 76  BILITOT 1.1  PROT 6.9  ALBUMIN 2.4*   Recent Labs  Lab 07/28/18 1402  LIPASE 29   No results for input(s): AMMONIA in the last 168 hours. Coagulation Profile: No results for input(s): INR, PROTIME in the last 168 hours. Cardiac Enzymes: No results for input(s): CKTOTAL, CKMB, CKMBINDEX, TROPONINI in the last 168 hours. BNP (last 3 results) No results for input(s): PROBNP in the last 8760 hours. HbA1C: No results for input(s): HGBA1C in the last 72 hours. CBG: No results for input(s): GLUCAP in the last 168 hours. Lipid Profile: No results for input(s): CHOL, HDL, LDLCALC, TRIG, CHOLHDL, LDLDIRECT in the last 72 hours. Thyroid Function Tests: No results for input(s): TSH, T4TOTAL, FREET4, T3FREE, THYROIDAB  in the last 72 hours. Anemia Panel: No results for input(s): VITAMINB12, FOLATE, FERRITIN, TIBC, IRON, RETICCTPCT in the last 72 hours. Urine analysis:    Component Value Date/Time   COLORURINE YELLOW 07/28/2018 2035   APPEARANCEUR HAZY (A) 07/28/2018 2035   APPEARANCEUR Turbid (A) 07/23/2018 1255   LABSPEC 1.017 07/28/2018 2035   PHURINE 6.0 07/28/2018 2035   GLUCOSEU NEGATIVE 07/28/2018 2035   HGBUR NEGATIVE 07/28/2018 2035   BILIRUBINUR NEGATIVE 07/28/2018 2035   BILIRUBINUR Negative 07/23/2018 1255   KETONESUR 80 (A) 07/28/2018 2035   PROTEINUR NEGATIVE 07/28/2018 2035   UROBILINOGEN 0.2 04/11/2012 2310   NITRITE NEGATIVE 07/28/2018 2035   LEUKOCYTESUR NEGATIVE 07/28/2018 2035   LEUKOCYTESUR Trace (A) 07/23/2018 1255   Sepsis Labs: @LABRCNTIP (procalcitonin:4,lacticidven:4) ) Recent Results (from the past 240 hour(s))  Microscopic Examination     Status: Abnormal   Collection Time: 07/23/18 12:55 PM  Result Value Ref Range Status   WBC, UA 6-10 (A) 0 - 5 /hpf Final   RBC, UA 0-2 0 - 2 /hpf Final   Epithelial Cells  (non renal) >10 (A) 0 - 10 /hpf Final   Casts None seen None seen /lpf Final   Mucus, UA Present Not Estab. Final   Bacteria, UA Few None seen/Few Final  GC/Chlamydia Probe Amp     Status: None   Collection Time: 07/23/18  2:30 PM  Result Value Ref Range Status   Chlamydia trachomatis, NAA Negative Negative Final   Neisseria gonorrhoeae by PCR Negative Negative Final  Urine Culture     Status: None   Collection Time: 07/23/18  2:30 PM  Result Value Ref Range Status   Urine Culture, Routine Final report  Final   Organism ID, Bacteria Comment  Final    Comment: Mixed urogenital flora Less than 10,000 colonies/mL      Radiological Exams on Admission: No results found.    Assessment/Plan Principal Problem:   Exacerbation of Crohn's disease ([place]) Active Problems:   Hyponatremia   [redacted] weeks gestation of pregnancy     #1 acute flare of Crohn's disease: Patient has failed outpatient trial of medications.  She will be admitted to medical floor.  She will be initiated on IV Solu-Medrol 125 mg every 6 hours for the next 24 hours.  GI has been consulted and will see patient.  Hydrate patient and supportive care.  Placed on clear liquid diet for now.  Diet could be advanced in the morning.  Continue other home medications  #2 hyponatremia: Secondary to dehydration.  Hydrate with saline and monitor closely.  # 3  16 weeks pregnancy: Patient will be monitored closely.  We will avoid medications that would interfere with house stage of pregnancy.  #4 dehydration: Aggressively hydrate patient.  #5 thrombocythemia: Probably reactive.  Monitor platelets level   DVT prophylaxis: SCD   Code Status: Full Code  Family Communication: None available   Disposition Plan: Home   Consults called: Dr Michail Sermon, GI   Admission status: inpatient, Medsurg   Severity of Illness: The appropriate patient status for this patient is INPATIENT. Inpatient status is judged to be reasonable and  necessary in order to provide the required intensity of service to ensure the patient's safety. The patient's presenting symptoms, physical exam findings, and initial radiographic and laboratory data in the context of their chronic comorbidities is felt to place them at high risk for further clinical deterioration. Furthermore, it is not anticipated that the patient will be medically stable for discharge from the  hospital within 2 midnights of admission. The following factors support the patient status of inpatient.   " The patient's presenting symptoms include abdominal pain and nausea. " The worrisome physical exam findings include dehydration on exam. " The initial radiographic and laboratory data are worrisome because of MRI showing evidence of Crohn's disease flare. " The chronic co-morbidities include history of Crohn's disease.   * I certify that at the point of admission it is my clinical judgment that the patient will require inpatient hospital care spanning beyond 2 midnights from the point of admission due to high intensity of service, high risk for further deterioration and high frequency of surveillance required.Barbette Merino MD [facility] Pager [phone]  If 7PM-7AM, please contact night-coverage [email or web address] Password [facility]  07/28/2018, 9:48 PM

## 2018-07-28 NOTE — ED Notes (Signed)
Re-paged [facility]

## 2018-07-28 NOTE — ED Notes (Signed)
Patient transported to MRI 

## 2018-07-28 NOTE — ED Notes (Signed)
Barium contrast at bed side

## 2018-07-29 ENCOUNTER — Other Ambulatory Visit: Payer: Self-pay

## 2018-07-29 ENCOUNTER — Encounter (HOSPITAL_COMMUNITY): Payer: Self-pay

## 2018-07-29 ENCOUNTER — Ambulatory Visit: Payer: Medicaid Other

## 2018-07-29 DIAGNOSIS — K50918 Crohn's disease, unspecified, with other complication: Secondary | ICD-10-CM

## 2018-07-29 LAB — CBC
HCT: 31 % — ABNORMAL LOW (ref 36.0–46.0)
Hemoglobin: 10.2 g/dL — ABNORMAL LOW (ref 12.0–15.0)
MCH: 27.7 pg (ref 26.0–34.0)
MCHC: 32.9 g/dL (ref 30.0–36.0)
MCV: 84.2 fL (ref 78.0–100.0)
PLATELETS: 353 10*3/uL (ref 150–400)
RBC: 3.68 MIL/uL — ABNORMAL LOW (ref 3.87–5.11)
RDW: 11.9 % (ref 11.5–15.5)
WBC: 5.8 10*3/uL (ref 4.0–10.5)

## 2018-07-29 LAB — COMPREHENSIVE METABOLIC PANEL
ALBUMIN: 2 g/dL — AB (ref 3.5–5.0)
ALT: 9 U/L (ref 0–44)
AST: 9 U/L — AB (ref 15–41)
Alkaline Phosphatase: 68 U/L (ref 38–126)
Anion gap: 8 (ref 5–15)
BILIRUBIN TOTAL: 1 mg/dL (ref 0.3–1.2)
CO2: 20 mmol/L — ABNORMAL LOW (ref 22–32)
CREATININE: 0.48 mg/dL (ref 0.44–1.00)
Calcium: 8.4 mg/dL — ABNORMAL LOW (ref 8.9–10.3)
Chloride: 104 mmol/L (ref 98–111)
GFR calc Af Amer: >60 mL/min (ref >60)
GLUCOSE: 166 mg/dL — AB (ref 70–99)
POTASSIUM: 4 mmol/L (ref 3.5–5.1)
Sodium: 132 mmol/L — ABNORMAL LOW (ref 135–145)
TOTAL PROTEIN: 6.2 g/dL — AB (ref 6.5–8.1)

## 2018-07-29 LAB — HIV ANTIBODY (ROUTINE TESTING W REFLEX): HIV Screen 4th Generation wRfx: NONREACTIVE

## 2018-07-29 MED ORDER — FOLIC ACID 1 MG PO TABS
1.0000 mg | ORAL_TABLET | Freq: Every day | ORAL | Status: DC
  Administered 2018-07-30: 1 mg via ORAL
  Filled 2018-07-29: qty 1

## 2018-07-29 MED ORDER — PRENATAL MULTIVITAMIN CH
1.0000 | ORAL_TABLET | Freq: Every day | ORAL | Status: DC
  Filled 2018-07-29 (×2): qty 1

## 2018-07-29 MED ORDER — METHYLPREDNISOLONE SODIUM SUCC 40 MG IJ SOLR
40.0000 mg | Freq: Four times a day (QID) | INTRAMUSCULAR | Status: DC
  Administered 2018-07-29 – 2018-07-30 (×6): 40 mg via INTRAVENOUS
  Filled 2018-07-29 (×6): qty 1

## 2018-07-29 NOTE — Plan of Care (Signed)

## 2018-07-29 NOTE — Consult Note (Signed)
Referring Provider: [facility] Primary Care Physician:  Patient, No Pcp Per Primary Gastroenterologist:  Dr. Oletta Lamas  Reason for Consultation:  Crohn's exacerbation  HPI: Lauren Ramirez is a 32 y.o. 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  was not on any maintenance medication. Multiple no shows to the GI clinic in the past. She was recently seen last week with abdominal pain and nausea concerning for Crohn's exacerbation. She was started on Pentasa which she was not able to fill  because of insurance issues. Because of ongoing abdominal pain and nausea she presented to the emergency room for further management.abdominal pain and supraumbilical in nature without any radiation. Associated with nausea but denied any vomiting. Denied any diarrhea.MRI abdomen on admission yesterday showed prominent wall thickening and inflammation of the terminal 9 cm of the small bowel compatible with active Crohn's disease. It also showed around 5.4 x 3.5 cm phlegmon or incipient abscess formation posterior to the inflamed loop of the bowel.   She has been started on IV Solu-Medrol. Significant improvement in symptoms. Abdominal pain has improved. She denies any diarrhea or blood in the stool. Nausea has improved. Denied any vomiting. Denied any fever or chills.  Past Medical History:  Diagnosis Date  . Abnormal Pap smear    ASCUS 2011  . Crohn's disease ([place])   . General counseling and advice for contraceptive management 09/06/2014  . Nexplanon in place 09/06/2014  . Vaginal Pap smear, abnormal     Past Surgical History:  Procedure Laterality Date  . CESAREAN SECTION  04/14/2012   Procedure: CESAREAN SECTION;  Surgeon: Osborne Oman, MD;  Location: [facility];  Service: Gynecology;  Laterality: N/A;  primary  . DILATION AND CURETTAGE OF UTERUS    . SMALL INTESTINE SURGERY      Prior to Admission medications   Medication Sig Start Date End Date Taking? Authorizing Provider  Doxylamine-Pyridoxine 10-10 MG TBEC 2 PO qhs; may take 1po in am and 1po in afternoon prn nausea Patient taking differently: Take 1-2 tablets by mouth See admin instructions. Take 2 tablets by mouth at bedtime and may also take 1 tablet in the morning and 1 tablet in the afternoon as needed for nausea 07/23/18  Yes Cresenzo-Dishmon, Joaquim Lai, CNM  esomeprazole (NEXIUM) 20 MG capsule Take 1 capsule (20 mg total) by mouth daily at 12 noon. 07/23/18  Yes Cresenzo-Dishmon, Joaquim Lai, CNM  glycopyrrolate (ROBINUL) 1 MG tablet Take 2 tablets (2 mg total) by mouth 3 (three) times daily. 07/23/18  Yes Cresenzo-Dishmon, Joaquim Lai, CNM  ondansetron (ZOFRAN ODT) 4 MG disintegrating tablet Take 1 tablet (4 mg total) by mouth every 6 (six) hours as needed for nausea. 07/23/18  Yes Cresenzo-Dishmon, Joaquim Lai, CNM  folic acid (FOLVITE) 1 MG tablet Take 1 tablet (1 mg total) by mouth daily. Patient not taking: Reported on 07/28/2018 05/26/18   Derrek Monaco A, NP  pyridOXINE (VITAMIN B-6) 25 MG tablet Take 1 tablet (25 mg total) by mouth every 8 (eight) hours. Patient not taking: Reported on 07/28/2018 07/08/18   Darlina Rumpf, CNM    Scheduled Meds: . acetaminophen  650 mg Oral Once  . glycopyrrolate  2 mg Oral TID  . methylPREDNISolone (SOLU-MEDROL) injection  125 mg Intravenous Q6H  . pantoprazole  40 mg  Oral Daily   Continuous Infusions: . dextrose 5 % and 0.9% NaCl 100 mL/hr at 07/29/18 0013   PRN Meds:.ondansetron **OR** ondansetron (ZOFRAN) IV  Allergies as of 07/28/2018  . (No Known Allergies)    Family History  Problem Relation Age of Onset  . Hypertension Mother   . Hypertension Maternal Grandmother   . Diabetes Maternal Grandmother   . Cancer  Maternal Grandfather   . Anesthesia problems Neg Hx     Social History   Socioeconomic History  . Marital status: Single    Spouse name: Not on file  . Number of children: Not on file  . Years of education: Not on file  . Highest education level: Some college, no degree  Occupational History  . Not on file  Social Needs  . Financial resource strain: Somewhat hard  . Food insecurity:    Worry: Never true    Inability: Never true  . Transportation needs:    Medical: No    Non-medical: No  Tobacco Use  . Smoking status: Former Smoker    Packs/day: 0.00    Years: 7.00    Pack years: 0.00    Types: Cigarettes    Last attempt to quit: 04/15/2014    Years since quitting: 4.2  . Smokeless tobacco: Never Used  . Tobacco comment: smokes 4-5 cig daily  Substance and Sexual Activity  . Alcohol use: Not Currently    Comment: only occasionally; before pregnancy  . Drug use: No  . Sexual activity: Yes    Birth control/protection: None  Lifestyle  . Physical activity:    Days per week: 0 days    Minutes per session: 0 min  . Stress: Only a little  Relationships  . Social connections:    Talks on phone: Once a week    Gets together: Never    Attends religious service: 1 to 4 times per year    Active member of club or organization: No    Attends meetings of clubs or organizations: Never    Relationship status: Never married  . Intimate partner violence:    Fear of current or ex partner: No    Emotionally abused: No    Physically abused: No    Forced sexual activity: No  Other Topics Concern  . Not on file  Social History Narrative  . Not on file    Review of Systems: Review of Systems  Constitutional: Negative for chills and fever.  HENT: Negative for hearing loss and tinnitus.   Eyes: Negative for blurred vision and double vision.  Respiratory: Negative for cough and hemoptysis.   Cardiovascular: Negative for chest pain and palpitations.  Gastrointestinal: Positive  for abdominal pain, heartburn and nausea. Negative for blood in stool, constipation, diarrhea, melena and vomiting.  Genitourinary: Negative for dysuria and urgency.  Musculoskeletal: Positive for back pain. Negative for myalgias.  Skin: Negative for itching and rash.  Neurological: Negative for seizures and loss of consciousness.  Endo/Heme/Allergies: Does not bruise/bleed easily.  Psychiatric/Behavioral: Negative for hallucinations and substance abuse.    Physical Exam: Vital signs: Vitals:   07/28/18 2244 07/29/18 0452  BP: 109/77 114/83  Pulse: 99 85  Resp: 17 17  Temp: 97.7 F (36.5 C) 97.6 F (36.4 C)  SpO2: 99% 100%     Physical Exam  Constitutional: She is oriented to person, place, and time. She appears well-developed and well-nourished. No distress.  HENT:  Head: Normocephalic and atraumatic.  Mouth/Throat: Oropharynx is clear and moist.  No oropharyngeal exudate.  Eyes: EOM are normal. No scleral icterus.  Neck: Normal range of motion. Neck supple. No thyromegaly present.  Cardiovascular: Normal rate and regular rhythm.  Pulmonary/Chest: Effort normal and breath sounds normal. No respiratory distress.  Abdominal: Soft. Bowel sounds are normal. She exhibits distension. There is no tenderness. There is no rebound and no guarding.  gravid abdomen  Musculoskeletal: Normal range of motion. She exhibits no edema.  Neurological: She is alert and oriented to person, place, and time.  Skin: Skin is warm. No erythema.  Psychiatric: She has a normal mood and affect. Judgment normal.  Vitals reviewed.   GI:  Lab Results: Recent Labs    07/28/18 1402 07/29/18 0404  WBC 7.5 5.8  HGB 11.7* 10.2*  HCT 35.8* 31.0*  PLT 431* 353   BMET Recent Labs    07/28/18 1402 07/29/18 0404  NA 134* 132*  K 3.8 4.0  CL 101 104  CO2 21* 20*  GLUCOSE 100* 166*  BUN <5* <5*  CREATININE 0.57 0.48  CALCIUM 9.0 8.4*   LFT Recent Labs    07/29/18 0404  PROT 6.2*  ALBUMIN  2.0*  AST 9*  ALT 9  ALKPHOS 68  BILITOT 1.0   PT/INR No results for input(s): LABPROT, INR in the last 72 hours.   Studies/Results: Mr Abdomen Wo Contrast  Result Date: 07/29/2018 CLINICAL DATA:  Second trimester pregnancy, history of Crohn's disease. Dyspepsia and gagging sensation. EXAM: MRI ABDOMEN AND PELVIS WITHOUT CONTRAST TECHNIQUE: Multiplanar multisequence MR imaging of the abdomen and pelvis was performed. No intravenous contrast was administered. COMPARISON:  CT scan from 04/17/2017 FINDINGS: COMBINED FINDINGS FOR BOTH MR ABDOMEN AND PELVIS Lower chest: Unremarkable where included. Hepatobiliary: There are about 5 gallstones ranging in size up to about 10 mm in diameter. No significant degree of gallbladder wall thickening or pericholecystic fluid. No biliary dilatation or appreciable choledocholithiasis. Hepatic parenchyma unremarkable where included. Pancreas:  Unremarkable Spleen:  Unremarkable Adrenals/Urinary Tract: Mild right hydroureter extending to the iliac vessel cross over where the ureter appears mildly compressed between the gravid uterus and the right iliac vasculature. Borderline right hydronephrosis. Adrenal glands unremarkable. Stomach/Bowel: Prominent wall thickening in the distal 9 cm loop of distal ileum extending to the ileo colic anastomosis. Extensive surrounding edema and inflammatory phlegmon in this vicinity, with a 5.4 by 3.5 cm phlegmon or region of incipient abscess shown posterior to the inflamed loop of bowel on image 64/27. Trace adjacent free fluid in the right paracolic gutter. There is likely secondary inflammation of the ascending colon adjacent to this phlegmon. There are dilated loops of small bowel extending to the inflamed loop. The dilated small bowel is up to 5.7 cm in diameter. Unusual configuration of the transverse colon as before, tracking up into the gastrohepatic space and somewhat posterior in position Vascular/Lymphatic: Scattered reactive  mesenteric and porta hepatis lymph nodes are present. Reactive external iliac lymph nodes on the right. Reproductive: Gravid uterus noted with single intrauterine fetus and posterior placenta without visualized previa. Today's exam was not optimized for fetal assessment. Amount of amniotic fluid appears appropriate. Other:  No supplemental non-categorized findings. Musculoskeletal: Unremarkable IMPRESSION: 1. Prominent wall thickening and inflammation of the terminal 9 cm of the small bowel extending to the ileo colic anastomosis compatible with active Crohn's disease. Adjacent 5.4 by 3.5 cm region of phlegmon or incipient abscess formation posterior to the inflamed loop of bowel. I am skeptical that this is a true drainable abscess at this point.  Notable surrounding inflammatory findings and trace fluid in the right paracolic gutter. 2. Notable dilatation of the small bowel proximal to this inflamed loop, up to 5.7 cm in diameter. 3. Somewhat unusual posterior positioning of the transverse colon which extends up into the gastrohepatic space, as on the prior CT scan. 4. Mild right hydroureter extending down to the iliac vessel where the ureter appears mildly compress by the gravid uterus. Borderline right hydronephrosis. 5. Cholelithiasis. 6. No appreciable fetal complication although today's exam was not protocolled for fetal assessment. Electronically Signed   By: Van Clines M.D.   On: 07/29/2018 08:10    Impression/Plan: -  Crohn's ileitis with Crohn's exacerbation - MRI showing wall thickening and inflammation of terminal 10 cm of small bowel. - 5.4 by 3.5 cm region of phlegmon or incipient abscess formation posterior to the inflamed loop of bowel - [redacted] weeks pregnant.  Recommendations ------------------------ - decrease Solu-Medrol to 40 mg every 6 hours. - increase diet to full liquids and advance as tolerated to soft diet. - check hepatitis B surface antigen  and TB Gold. - optimize her  Crohn's disease management / regimen as an outpatient basis.she may need immunomodulator or anti-TNF if no improvement with mesalamine.also consider CIMZIA as there is a minimal placental transfer to the infant during  pregnancy and hence it can be used throughout the pregnancy and until delivery if needed.  - we discussed that controlling Crohn's disease is associated with a favorable pregnancy outcome. - hopefully discharge home tomorrow if continues to do better. - GI will follow   LOS: 1 day   Otis Brace  MD, FACP 07/29/2018, 9:39 AM  Contact #  [phone]

## 2018-07-29 NOTE — Progress Notes (Signed)
Here to complete doppler FHTs for this 32 yo [id number] @ [redacted] wks GA inpatient with Chron's exacerbation.  Patient denies current abdominal pain, vaginal bleeding, or LOF.  She reports no OB complications with this pregnancy.  Her last delivery was a set of twins via C/S and she plans to attempt a VBAC. She gets her OB care at [facility] in [place].  She states that she is feeling much better since being treated with fluids and solumedrol here in the hospital. 1502 Dr. Si Raider updated on pt and informed of pregnancy history and above.

## 2018-07-29 NOTE — Progress Notes (Signed)
Patient Demographics:    Lauren Ramirez, is a 32 y.o. female, DOB - [birth year], CYE:[id number]  Admit date - 07/28/2018   Admitting Physician Lauren Reach, MD  Outpatient Primary MD for the patient is Lauren Ramirez  LOS - 1   Chief Complaint  Patient presents with  . Abdominal Pain  . Nausea        Subjective:    Lauren Ramirez today has no fevers, no emesis,  No chest pain,  No n/v/d, had formed stool today  Assessment  & Plan :    Principal Problem:   Exacerbation of Crohn's disease (Lauren Ramirez) Active Problems:   Hyponatremia   [redacted] weeks gestation of pregnancy  Brief Summary Crohn's disease with history of small bowel obstruction and fistulization to the sigmoid colon requiring ileocecectomy with anastomosis and repair of sigmoid colon fistula in 2009 who was not on any chronic immunosuppressants and who is about [redacted] weeks pregnant now admitted on 07/28/2018 with a flareup of her Crohn's colitis with concerns about possible phlegmon/incipient on MRI abdomen (5.4 x 3.5 cm)  Plan:- 1) Crohn's Colitis Flare-up--- appreciated to recommend decreasing Solu-Medrol to 40 mg every 6 hours, maybe a candidate for Lauren Ramirez as an outpatient for immunemodulation  2)OB--- [id number] (1 sent of twins by C/s 6 yrs ago)-at [redacted] weeks gestation, no surgical concerns at this time, give prenatal vitamin  Code Status : full   Disposition Plan  : home in 1 to 2 days pending clinical improvement  Consults  :  Gi   DVT Prophylaxis  :    SCDs    Lab Results  Component Value Date   PLT 353 07/29/2018    Inpatient Medications  Scheduled Meds: . acetaminophen  650 mg Oral Once  . folic acid  1 mg Oral Daily  . glycopyrrolate  2 mg Oral TID  . methylPREDNISolone (SOLU-MEDROL) injection  40 mg Intravenous Q6H  . pantoprazole  40 mg Oral Daily  . prenatal multivitamin  1 tablet Oral Q1200   Continuous  Infusions: . dextrose 5 % and 0.9% NaCl 100 mL/hr at 07/29/18 1057   PRN Meds:.ondansetron **OR** ondansetron (ZOFRAN) IV    Anti-infectives (From admission, onward)   None        Objective:   Vitals:   07/28/18 2244 07/28/18 2245 07/29/18 0452 07/29/18 1620  BP: 109/77  114/83 115/68  Pulse: 99  85 78  Resp: 17  17 16   Temp: 97.7 F (36.5 C)  97.6 F (36.4 C)   TempSrc: Oral     SpO2: 99%  100% 95%  Weight:  84.6 kg    Height:  5' 5.98" (1.676 m)      Wt Readings from Last 3 Encounters:  07/28/18 84.6 kg  07/23/18 84.8 kg  07/08/18 98.9 kg     Intake/Output Summary (Last 24 hours) at 07/29/2018 1625 Last data filed at 07/29/2018 0015 Gross Ramirez 24 hour  Intake 576.1 ml  Output -  Net 576.1 ml     Physical Exam  Gen:- Awake Alert,  In no apparent distress  HEENT:- Lauren Ramirez.AT, No sclera icterus Neck-Supple Neck,No JVD,.  Lungs-  CTAB , good air movement CV- S1, S2 normal Abd-  +ve B.Sounds, Abd Soft, consistent  with IUP at 16 weeks, lower abdominal tenderness without rebound or guarding    Extremity/Skin:- No  edema,   warm and dry Psych-affect is appropriate, oriented x3 Neuro-no new focal deficits, no tremors   Data Review:   Micro Results Recent Results (from the past 240 hour(s))  Microscopic Examination     Status: Abnormal   Collection Time: 07/23/18 12:55 PM  Result Value Ref Range Status   WBC, UA 6-10 (A) 0 - 5 /hpf Final   RBC, UA 0-2 0 - 2 /hpf Final   Epithelial Cells (non renal) >10 (A) 0 - 10 /hpf Final   Casts None seen None seen /lpf Final   Mucus, UA Present Not Estab. Final   Bacteria, UA Few None seen/Few Final  GC/Chlamydia Probe Amp     Status: None   Collection Time: 07/23/18  2:30 PM  Result Value Ref Range Status   Chlamydia trachomatis, NAA Negative Negative Final   Neisseria gonorrhoeae by PCR Negative Negative Final  Urine Culture     Status: None   Collection Time: 07/23/18  2:30 PM  Result Value Ref Range Status   Urine  Culture, Routine Final report  Final   Organism ID, Bacteria Comment  Final    Comment: Mixed urogenital flora Less than 10,000 colonies/mL     Radiology Reports Mr Abdomen Wo Contrast  Result Date: 07/29/2018 CLINICAL DATA:  Second trimester pregnancy, history of Crohn's disease. Dyspepsia and gagging sensation. EXAM: MRI ABDOMEN AND PELVIS WITHOUT CONTRAST TECHNIQUE: Multiplanar multisequence MR imaging of the abdomen and pelvis was performed. No intravenous contrast was administered. COMPARISON:  CT scan from 04/17/2017 FINDINGS: COMBINED FINDINGS FOR BOTH MR ABDOMEN AND PELVIS Lower chest: Unremarkable where included. Hepatobiliary: There are about 5 gallstones ranging in size up to about 10 mm in diameter. No significant degree of gallbladder wall thickening or pericholecystic fluid. No biliary dilatation or appreciable choledocholithiasis. Hepatic parenchyma unremarkable where included. Pancreas:  Unremarkable Spleen:  Unremarkable Adrenals/Urinary Tract: Mild right hydroureter extending to the iliac vessel cross over where the ureter appears mildly compressed between the gravid uterus and the right iliac vasculature. Borderline right hydronephrosis. Adrenal glands unremarkable. Stomach/Bowel: Prominent wall thickening in the distal 9 cm loop of distal ileum extending to the ileo colic anastomosis. Extensive surrounding edema and inflammatory phlegmon in this vicinity, with a 5.4 by 3.5 cm phlegmon or region of incipient abscess shown posterior to the inflamed loop of bowel on image 64/27. Trace adjacent free fluid in the right paracolic gutter. There is likely secondary inflammation of the ascending colon adjacent to this phlegmon. There are dilated loops of small bowel extending to the inflamed loop. The dilated small bowel is up to 5.7 cm in diameter. Unusual configuration of the transverse colon as before, tracking up into the gastrohepatic space and somewhat posterior in position  Vascular/Lymphatic: Scattered reactive mesenteric and porta hepatis lymph nodes are present. Reactive external iliac lymph nodes on the right. Reproductive: Gravid uterus noted with single intrauterine fetus and posterior placenta without visualized previa. Today's exam was not optimized for fetal assessment. Amount of amniotic fluid appears appropriate. Other:  No supplemental non-categorized findings. Musculoskeletal: Unremarkable IMPRESSION: 1. Prominent wall thickening and inflammation of the terminal 9 cm of the small bowel extending to the ileo colic anastomosis compatible with active Crohn's disease. Adjacent 5.4 by 3.5 cm region of phlegmon or incipient abscess formation posterior to the inflamed loop of bowel. I am skeptical that this is a true drainable abscess at  this point. Notable surrounding inflammatory findings and trace fluid in the right paracolic gutter. 2. Notable dilatation of the small bowel proximal to this inflamed loop, up to 5.7 cm in diameter. 3. Somewhat unusual posterior positioning of the transverse colon which extends up into the gastrohepatic space, as on the prior CT scan. 4. Mild right hydroureter extending down to the iliac vessel where the ureter appears mildly compress by the gravid uterus. Borderline right hydronephrosis. 5. Cholelithiasis. 6. No appreciable fetal complication although today's exam was not protocolled for fetal assessment. Electronically Signed   By: Van Clines M.D.   On: 07/29/2018 08:10     CBC Recent Labs  Lab 07/23/18 1255 07/28/18 1402 07/29/18 0404  WBC 6.8 7.5 5.8  HGB 12.5 11.7* 10.2*  HCT 36.7 35.8* 31.0*  PLT 460* 431* 353  MCV 84 85.0 84.2  MCH 28.5 27.8 27.7  MCHC 34.1 32.7 32.9  RDW 13.2 11.9 11.9  LYMPHSABS 1.2  --   --   EOSABS 0.1  --   --   BASOSABS 0.0  --   --     Chemistries  Recent Labs  Lab 07/28/18 1402 07/29/18 0404  NA 134* 132*  K 3.8 4.0  CL 101 104  CO2 21* 20*  GLUCOSE 100* 166*  BUN <5* <5*    CREATININE 0.57 0.48  CALCIUM 9.0 8.4*  AST 11* 9*  ALT 9 9  ALKPHOS 76 68  BILITOT 1.1 1.0   ------------------------------------------------------------------------------------------------------------------ No results for input(s): CHOL, HDL, LDLCALC, TRIG, CHOLHDL, LDLDIRECT in the last 72 hours.  No results found for: HGBA1C ------------------------------------------------------------------------------------------------------------------ No results for input(s): TSH, T4TOTAL, T3FREE, THYROIDAB in the last 72 hours.  Invalid input(s): FREET3 ------------------------------------------------------------------------------------------------------------------ No results for input(s): VITAMINB12, FOLATE, FERRITIN, TIBC, IRON, RETICCTPCT in the last 72 hours.  Coagulation profile No results for input(s): INR, PROTIME in the last 168 hours.  No results for input(s): DDIMER in the last 72 hours.  Cardiac Enzymes No results for input(s): CKMB, TROPONINI, MYOGLOBIN in the last 168 hours.  Invalid input(s): CK ------------------------------------------------------------------------------------------------------------------ No results found for: BNP   Roxan Hockey M.D on 07/29/2018 at 4:25 PM  Pager---[phone] Go to [email or web address] - password [email or web address] for contact info  [facility] - Office  [phone]

## 2018-07-29 NOTE — Progress Notes (Addendum)
Admitting report received from Antonietta Barcelona RN.  Patient admitted from emergency department, reports patients prepartum at [redacted] weeks gestation.  Admitted with abdominal pain related to Chron's.  Patient alert and oriented x 4, denies pain with assess. Denies nausea and vomiting.  Tolerated medications via IV no distress. Remains in stable condition,  will follow up.

## 2018-07-30 LAB — HEPATITIS B SURFACE ANTIGEN: HEP B S AG: NEGATIVE

## 2018-07-30 MED ORDER — FOLIC ACID 1 MG PO TABS: 1.0000 mg | ORAL_TABLET | Freq: Every day | ORAL | 2 refills | Status: DC

## 2018-07-30 MED ORDER — MAGNESIUM HYDROXIDE 400 MG/5ML PO SUSP
30.0000 mL | Freq: Once | ORAL | Status: AC
  Administered 2018-07-30: 30 mL via ORAL
  Filled 2018-07-30: qty 30

## 2018-07-30 MED ORDER — PREDNISONE 10 MG PO TABS: ORAL_TABLET | ORAL | 1 refills | Status: DC

## 2018-07-30 MED ORDER — PRENATAL MULTIVITAMIN CH: 1.0000 | ORAL_TABLET | Freq: Every day | ORAL | 3 refills | Status: DC

## 2018-07-30 MED ORDER — PANTOPRAZOLE SODIUM 40 MG PO TBEC: 40.0000 mg | DELAYED_RELEASE_TABLET | Freq: Every day | ORAL | 3 refills | Status: DC

## 2018-07-30 MED ORDER — ONDANSETRON 4 MG PO TBDP: 4.0000 mg | ORAL_TABLET | Freq: Four times a day (QID) | ORAL | 2 refills | Status: DC | PRN

## 2018-07-30 MED ORDER — ACETAMINOPHEN 325 MG PO TABS: 650.0000 mg | ORAL_TABLET | Freq: Once | ORAL | 0 refills | Status: AC

## 2018-07-30 NOTE — Discharge Summary (Signed)
Lauren Ramirez, is a 32 y.o. female  DOB Dec 26, 1985  MRN [id number].  Admission date:  07/28/2018  Admitting Physician  Elwyn Reach, MD  Discharge Date:  07/30/2018   Primary MD  Patient, No Pcp Per  Recommendations for primary care physician for things to follow:  1) very slow oral prednisone taper-- Take 4 tablets [40 mg] daily with breakfast for 7 days, then 3 tablets] 30 mg daily with food for 7 days, then 2 tablets [20 mg] daily with food for 7 days, 1 tablet [10 mg] daily with food for 1 week then STOP 2)Avoid ibuprofen/Advil/Aleve/Motrin/Goody Powders/Naproxen/BC powders/Meloxicam/Diclofenac/Indomethacin and other Nonsteroidal anti-inflammatory medications as these will make you more likely to bleed and can cause stomach ulcers, can also cause Kidney problems.  3)Folic Acid and prenatal vitamins while pregnant and while breast-feeding 4)Please talk to your OB/GYN impressive remedies for constipation while pregnant and while breast-feeding 5) follow-up with gastroenterologist as advised over the next couple weeks to further discuss management of your Crohn's colitis   Admission Diagnosis  Exacerbation of Crohn's disease with complication ([place]) [K16.010]   Discharge Diagnosis  Exacerbation of Crohn's disease with complication ([place]) [X32.355]    Principal Problem:   Exacerbation of Crohn's disease ([place]) Active Problems:   Hyponatremia   [redacted] weeks gestation of pregnancy      Past Medical History:  Diagnosis Date  . Abnormal Pap smear    ASCUS 2011  . Crohn's disease ([place])   . General counseling and advice for contraceptive management 09/06/2014  . Nexplanon in place 09/06/2014  . Vaginal Pap smear, abnormal     Past Surgical History:  Procedure Laterality Date  . CESAREAN SECTION  04/14/2012   Procedure: CESAREAN SECTION;  Surgeon: Osborne Oman, MD;  Location: [facility];  Service:  Gynecology;  Laterality: N/A;  primary  . DILATION AND CURETTAGE OF UTERUS    . SMALL INTESTINE SURGERY         HPI  from the history and physical done on the day of admission:     Chief Complaint: Abdominal pain and anorexia  HPI: Lauren Ramirez is a 32 y.o. female with medical history significant of Crohn's disease who is [redacted] weeks pregnant presenting with persistent nausea but no vomiting, anorexia and intermittent abdominal pain.  Patient is unable to eat and has been having difficulty functioning.  She has had these symptoms improve the past when she was having flareup of her Crohn's disease.  She has been followed by Dr. Oletta Lamas who tried to get her Pentasa in the outpatient setting but she could not afford it.  Patient came to the ER with persistent symptoms.  She is pregnant with her fifth child and still in the early trimesters.  Patient had MRI of the abdomen the confirmed flareup of her Crohn's disease.  GI was consulted and recommended inpatient admission for IV steroids.  She will be seen by GI in the morning.  ED Course: Patient's vitals include temperature of 99.6, blood pressure 128/90,  pulse 125, respiratory of 17 and oxygen sat 100% room air.  She has a white count of 7.5, hemoglobin 11.7 sodium 134 potassium 3.8 CO2 21 creatinine 0.57 and glucose of 100.  Platelet is 431.  Urinalysis is negative.  MRI of the abdomen showed findings consistent with flareup of Crohn's disease.    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 who was not on any chronic immunosuppressants and who is about [redacted] weeks pregnant now admitted on 07/28/2018 with a flareup of her Crohn's colitis with concerns about possible phlegmon/incipient on MRI abdomen (5.4 x 3.5 cm)  Plan:- 1) Crohn's Colitis Flare-up---  [facility] consult  appreciated,  recommend decreasing Solu-Medrol to 40 mg every 6 hours, maybe a candidate for CIMZIA as an outpatient for immune-modulation, as per GI service okay to discharge home on slow taper of oral prednisone  2)OB--- [id number] (1 sent of twins by C/s 6 yrs ago)-at [redacted] weeks gestation, no surgical concerns at this time, compliance with prenatal vitamin advised   Code Status : full   Disposition Plan  : home   Consults  :  Gi  DVT Prophylaxis  :    SCDs    Discharge Condition: stable  Follow UP  Follow-up Information    Laurence Spates, MD. Schedule an appointment as soon as possible for a visit in 2 week(s).   Specialty:  Gastroenterology Why:  follow-up for Crohn's exacerbation Contact information: [address] [place] [address] [phone]           Diet and Activity recommendation:  As advised  Discharge Instructions    Discharge Instructions    Call MD for:  difficulty breathing, headache or visual disturbances   Complete by:  As directed    Call MD for:  persistant dizziness or light-headedness   Complete by:  As directed    Call MD for:  persistant nausea and vomiting   Complete by:  As directed    Call MD for:  severe uncontrolled pain   Complete by:  As directed    Call MD for:  temperature >100.4   Complete by:  As directed    Diet - low sodium heart healthy   Complete by:  As directed    Discharge instructions   Complete by:  As directed    1) very slow oral prednisone taper-- Take 4 tablets [40 mg] daily with breakfast for 7 days, then 3 tablets] 30 mg daily with food for 7 days, then 2 tablets [20 mg] daily with food for 7 days, 1 tablet [10 mg] daily with food for 1 week then STOP 2)Avoid ibuprofen/Advil/Aleve/Motrin/Goody Powders/Naproxen/BC powders/Meloxicam/Diclofenac/Indomethacin and other Nonsteroidal anti-inflammatory medications as these will make you more likely to bleed and can cause stomach ulcers, can also cause  Kidney problems.  3)Folic Acid and prenatal vitamins while pregnant and while breast-feeding 4)Please talk to your OB/GYN impressive remedies for constipation while pregnant and while breast-feeding 5) follow-up with gastroenterologist as advised over the next couple weeks to further discuss management of your Crohn's colitis   Increase activity slowly   Complete by:  As directed         Discharge Medications     Allergies as of 07/30/2018   No Known Allergies     Medication List    STOP taking these medications  Doxylamine-Pyridoxine 10-10 MG Tbec   esomeprazole 20 MG capsule Commonly known as:  [place] Replaced by:  pantoprazole 40 MG tablet   glycopyrrolate 1 MG tablet Commonly known as:  ROBINUL   pyridOXINE 25 MG tablet Commonly known as:  VITAMIN B-6     TAKE these medications   acetaminophen 325 MG tablet Commonly known as:  TYLENOL Take 2 tablets (650 mg total) by mouth once for 1 dose.   folic acid 1 MG tablet Commonly known as:  FOLVITE Take 1 tablet (1 mg total) by mouth daily.   ondansetron 4 MG disintegrating tablet Commonly known as:  ZOFRAN-ODT Take 1 tablet (4 mg total) by mouth every 6 (six) hours as needed for nausea.   pantoprazole 40 MG tablet Commonly known as:  PROTONIX Take 1 tablet (40 mg total) by mouth daily. Start taking on:  07/31/2018 Replaces:  esomeprazole 20 MG capsule   predniSONE 10 MG tablet Commonly known as:  DELTASONE Take 4 tablets [40 mg] daily with breakfast for 7 days, then 3 tablets] 30 mg daily with food for 7 days, then 2 tablets [20 mg] daily with food for 7 days, 1 tablet [10 mg] daily with food for 1 week then STOP   prenatal multivitamin Tabs tablet Take 1 tablet by mouth daily.       Major procedures and Radiology Reports - PLEASE review detailed and final reports for all details, in brief -   Mr Abdomen Wo Contrast  Result Date: 07/29/2018 CLINICAL DATA:  Second trimester pregnancy, history of Crohn's  disease. Dyspepsia and gagging sensation. EXAM: MRI ABDOMEN AND PELVIS WITHOUT CONTRAST TECHNIQUE: Multiplanar multisequence MR imaging of the abdomen and pelvis was performed. No intravenous contrast was administered. COMPARISON:  CT scan from 04/17/2017 FINDINGS: COMBINED FINDINGS FOR BOTH MR ABDOMEN AND PELVIS Lower chest: Unremarkable where included. Hepatobiliary: There are about 5 gallstones ranging in size up to about 10 mm in diameter. No significant degree of gallbladder wall thickening or pericholecystic fluid. No biliary dilatation or appreciable choledocholithiasis. Hepatic parenchyma unremarkable where included. Pancreas:  Unremarkable Spleen:  Unremarkable Adrenals/Urinary Tract: Mild right hydroureter extending to the iliac vessel cross over where the ureter appears mildly compressed between the gravid uterus and the right iliac vasculature. Borderline right hydronephrosis. Adrenal glands unremarkable. Stomach/Bowel: Prominent wall thickening in the distal 9 cm loop of distal ileum extending to the ileo colic anastomosis. Extensive surrounding edema and inflammatory phlegmon in this vicinity, with a 5.4 by 3.5 cm phlegmon or region of incipient abscess shown posterior to the inflamed loop of bowel on image 64/27. Trace adjacent free fluid in the right paracolic gutter. There is likely secondary inflammation of the ascending colon adjacent to this phlegmon. There are dilated loops of small bowel extending to the inflamed loop. The dilated small bowel is up to 5.7 cm in diameter. Unusual configuration of the transverse colon as before, tracking up into the gastrohepatic space and somewhat posterior in position Vascular/Lymphatic: Scattered reactive mesenteric and porta hepatis lymph nodes are present. Reactive external iliac lymph nodes on the right. Reproductive: Gravid uterus noted with single intrauterine fetus and posterior placenta without visualized previa. Today's exam was not optimized for fetal  assessment. Amount of amniotic fluid appears appropriate. Other:  No supplemental non-categorized findings. Musculoskeletal: Unremarkable IMPRESSION: 1. Prominent wall thickening and inflammation of the terminal 9 cm of the small bowel extending to the ileo colic anastomosis compatible with active Crohn's disease. Adjacent 5.4 by 3.5 cm region of phlegmon or incipient  abscess formation posterior to the inflamed loop of bowel. I am skeptical that this is a true drainable abscess at this point. Notable surrounding inflammatory findings and trace fluid in the right paracolic gutter. 2. Notable dilatation of the small bowel proximal to this inflamed loop, up to 5.7 cm in diameter. 3. Somewhat unusual posterior positioning of the transverse colon which extends up into the gastrohepatic space, as on the prior CT scan. 4. Mild right hydroureter extending down to the iliac vessel where the ureter appears mildly compress by the gravid uterus. Borderline right hydronephrosis. 5. Cholelithiasis. 6. No appreciable fetal complication although today's exam was not protocolled for fetal assessment. Electronically Signed   By: Van Clines M.D.   On: 07/29/2018 08:10    Micro Results   Recent Results (from the past 240 hour(s))  Microscopic Examination     Status: Abnormal   Collection Time: 07/23/18 12:55 PM  Result Value Ref Range Status   WBC, UA 6-10 (A) 0 - 5 /hpf Final   RBC, UA 0-2 0 - 2 /hpf Final   Epithelial Cells (non renal) >10 (A) 0 - 10 /hpf Final   Casts None seen None seen /lpf Final   Mucus, UA Present Not Estab. Final   Bacteria, UA Few None seen/Few Final  GC/Chlamydia Probe Amp     Status: None   Collection Time: 07/23/18  2:30 PM  Result Value Ref Range Status   Chlamydia trachomatis, NAA Negative Negative Final   Neisseria gonorrhoeae by PCR Negative Negative Final  Urine Culture     Status: None   Collection Time: 07/23/18  2:30 PM  Result Value Ref Range Status   Urine Culture,  Routine Final report  Final   Organism ID, Bacteria Comment  Final    Comment: Mixed urogenital flora Less than 10,000 colonies/mL        Today   Subjective    [name] today has no new complaints, No fever  Or chills  No Nausea, Vomiting or Diarrhea          Patient has been seen and examined prior to discharge   Objective   Blood pressure 111/66, pulse 77, temperature 97.8 F (36.6 C), resp. rate 20, height 5' 5.98" (1.676 m), weight 84.6 kg, last menstrual period 04/07/2018, SpO2 100 %.  No intake or output data in the 24 hours ending 07/30/18 1535  Exam Gen:- Awake Alert,  In no apparent distress  HEENT:- [place].AT, No sclera icterus Neck-Supple Neck,No JVD,.  Lungs-  CTAB , good air movement CV- S1, S2 normal, regular Abd-  +ve B.Sounds, Abd Soft, consistent with IUP at 16 weeks, overall much improved lower abdominal tenderness without rebound or guarding    Extremity/Skin:- No  edema,   warm and dry Psych-affect is appropriate, oriented x3 Neuro-no new focal deficits, no tremors   Data Review   CBC w Diff:  Lab Results  Component Value Date   WBC 5.8 07/29/2018   HGB 10.2 (L) 07/29/2018   HGB 12.5 07/23/2018   HCT 31.0 (L) 07/29/2018   HCT 36.7 07/23/2018   PLT 353 07/29/2018   PLT 460 (H) 07/23/2018   LYMPHOPCT 12 06/17/2008   MONOPCT 9 06/17/2008   EOSPCT 1 06/17/2008   BASOPCT 0 06/17/2008    CMP:  Lab Results  Component Value Date   NA 132 (L) 07/29/2018   K 4.0 07/29/2018   CL 104 07/29/2018   CO2 20 (L) 07/29/2018   BUN <5 (L)  07/29/2018   CREATININE 0.48 07/29/2018   PROT 6.2 (L) 07/29/2018   ALBUMIN 2.0 (L) 07/29/2018   BILITOT 1.0 07/29/2018   ALKPHOS 68 07/29/2018   AST 9 (L) 07/29/2018   ALT 9 07/29/2018  .   Total Discharge time is about 33 minutes  Roxan Hockey M.D on 07/30/2018 at 3:35 PM  Pager---[phone]  Go to [email or web address] - password [email or web address] for contact info  [facility] - Office   [phone]

## 2018-07-30 NOTE — Progress Notes (Signed)
Dan Europe to be D/C'd to home per MD order.  Discussed with the patient and all questions fully answered.  VSS, Skin clean, dry and intact without evidence of skin break down, no evidence of skin tears noted. IV catheter discontinued intact. Site without signs and symptoms of complications. Dressing and pressure applied.  An After Visit Summary was printed and given to the patient. Patient received prescriptions.  D/c education completed with patient/family including follow up instructions, medication list, d/c activities limitations if indicated, with other d/c instructions as indicated by MD - patient able to verbalize understanding, all questions fully answered.   Patient instructed to return to ED, call 911, or call MD for any changes in condition.   Patient escorted via [place], and D/C home via private auto.  Morley Kos Price 07/30/2018 4:15 PM

## 2018-07-30 NOTE — Discharge Instructions (Signed)
1) very slow oral prednisone taper-- Take 4 tablets [40 mg] daily with breakfast for 7 days, then 3 tablets] 30 mg daily with food for 7 days, then 2 tablets [20 mg] daily with food for 7 days, 1 tablet [10 mg] daily with food for 1 week then STOP 2)Avoid ibuprofen/Advil/Aleve/Motrin/Goody Powders/Naproxen/BC powders/Meloxicam/Diclofenac/Indomethacin and other Nonsteroidal anti-inflammatory medications as these will make you more likely to bleed and can cause stomach ulcers, can also cause Kidney problems.  3)Folic Acid and prenatal vitamins while pregnant and while breast-feeding 4)Please talk to your OB/GYN impressive remedies for constipation while pregnant and while breast-feeding 5) follow-up with gastroenterologist as advised over the next couple weeks to further discuss management of your Crohn's colitis

## 2018-07-30 NOTE — Progress Notes (Signed)
Dr Beverely Risen updated on pt status of current hospital admission.  D/C order for qd dopplers.  Keep appt with [facility] on 8/30 if anticipated POC for D/C home occurs today.  Has order to be discharged if she is tolerating a regular diet.  Also has appt with [facility] on 9/3 and 9/11.  Cleared from [facility] service.

## 2018-07-30 NOTE — Progress Notes (Signed)
[facility] Gastroenterology Progress Note  Lauren Ramirez 32 y.o. 1986-06-04  CC:   Crohn's exacerbation   Subjective:  feeling much better today. Denied nausea vomiting. Abdominal pain has improved. Having regular bowel movement. Denied any diarrhea or blood in the stool.     Objective: Vital signs in last 24 hours: Vitals:   07/30/18 0611 07/30/18 1309  BP: 99/64 111/66  Pulse: 88 77  Resp: 18 20  Temp: 98.4 F (36.9 C) 97.8 F (36.6 C)  SpO2: 98% 100%    Physical Exam:  Gen. Alert 3. Not in Acute Distress. Abdomen. Soft, nontender, nondistended, bowel sounds present.  Lab Results: Recent Labs    07/28/18 1402 07/29/18 0404  NA 134* 132*  K 3.8 4.0  CL 101 104  CO2 21* 20*  GLUCOSE 100* 166*  BUN <5* <5*  CREATININE 0.57 0.48  CALCIUM 9.0 8.4*   Recent Labs    07/28/18 1402 07/29/18 0404  AST 11* 9*  ALT 9 9  ALKPHOS 76 68  BILITOT 1.1 1.0  PROT 6.9 6.2*  ALBUMIN 2.4* 2.0*   Recent Labs    07/28/18 1402 07/29/18 0404  WBC 7.5 5.8  HGB 11.7* 10.2*  HCT 35.8* 31.0*  MCV 85.0 84.2  PLT 431* 353   No results for input(s): LABPROT, INR in the last 72 hours.    Assessment/Plan: -  Crohn's ileitis with Crohn's exacerbation - MRI showing wall thickening and inflammation of terminal 10 cm of small bowel. - 5.4 by 3.5 cm region of phlegmon or incipient abscess formation posterior to the inflamed loop of bowel - [redacted] weeks pregnant.  Recommendations ------------------------ - patient feeling much better today. Tolerating diet. - Okay to discharge home today from GI standpoint. - Recommend tapering dose of steroids with 40 mg once a day for one week followed by 30 mg for one week followed by 20 mg per one week and then 10 mg for another week. - Follow-up in GI clinic in 2 weeks.  - hepatitis B surface antigen  negative , TB Gold pending  - optimize her Crohn's disease management / regimen as an outpatient basis.she may need immunomodulator or  anti-TNF if no improvement with mesalamine.also consider CIMZIA as there is a minimal placental transfer to the infant during  pregnancy and hence it can be used throughout the pregnancy and until delivery if needed.  - we discussed that controlling Crohn's disease is associated with a favorable pregnancy outcome. - GI will sign off. Call us back if needed    Otis Brace MD, [place] 07/30/2018, 2:00 PM  Contact #  [phone]

## 2018-07-31 ENCOUNTER — Ambulatory Visit: Payer: Medicaid Other

## 2018-08-01 ENCOUNTER — Inpatient Hospital Stay
Admission: RE | Admit: 2018-08-01 | Discharge: 2018-08-01 | Disposition: A | Discharge status: Home / Self Care | Payer: Medicaid Other | Source: Ambulatory Visit | Attending: Gastroenterology | Admitting: Gastroenterology

## 2018-08-03 LAB — QUANTIFERON-TB GOLD PLUS (RQFGPL)
QUANTIFERON MITOGEN VALUE: 0.05 [IU]/mL
QUANTIFERON NIL VALUE: 0.03 [IU]/mL
QUANTIFERON TB1 AG VALUE: 0.03 [IU]/mL
QUANTIFERON TB2 AG VALUE: 0.03 [IU]/mL

## 2018-08-03 LAB — QUANTIFERON-TB GOLD PLUS: QuantiFERON-TB Gold Plus: UNDETERMINED

## 2018-08-04 ENCOUNTER — Ambulatory Visit (INDEPENDENT_AMBULATORY_CARE_PROVIDER_SITE_OTHER): Payer: Medicaid Other

## 2018-08-04 VITALS — BP 122/74 | HR 111 | Ht 66.0 in | Wt 194.0 lb

## 2018-08-04 DIAGNOSIS — Z1389 Encounter for screening for other disorder: Secondary | ICD-10-CM

## 2018-08-04 DIAGNOSIS — Z331 Pregnant state, incidental: Secondary | ICD-10-CM

## 2018-08-04 DIAGNOSIS — Z3482 Encounter for supervision of other normal pregnancy, second trimester: Secondary | ICD-10-CM

## 2018-08-04 DIAGNOSIS — O09892 Supervision of other high risk pregnancies, second trimester: Secondary | ICD-10-CM

## 2018-08-04 DIAGNOSIS — O09212 Supervision of pregnancy with history of pre-term labor, second trimester: Secondary | ICD-10-CM

## 2018-08-04 LAB — POCT URINALYSIS DIPSTICK OB
GLUCOSE, UA: NEGATIVE
Ketones, UA: NEGATIVE
LEUKOCYTES UA: NEGATIVE
Nitrite, UA: NEGATIVE
RBC UA: NEGATIVE

## 2018-08-04 MED ORDER — HYDROXYPROGESTERONE CAPROATE 275 MG/1.1ML ~~LOC~~ SOAJ
275.0000 mg | Freq: Once | SUBCUTANEOUS | Status: AC
  Administered 2018-08-04: 275 mg via SUBCUTANEOUS

## 2018-08-04 NOTE — Progress Notes (Signed)
Pt here for 17P injection. Tolerated well. Return 1 week for next injection. Pad CMA

## 2018-08-12 ENCOUNTER — Ambulatory Visit (INDEPENDENT_AMBULATORY_CARE_PROVIDER_SITE_OTHER): Payer: Medicaid Other | Admitting: Obstetrics and Gynecology

## 2018-08-12 ENCOUNTER — Ambulatory Visit (INDEPENDENT_AMBULATORY_CARE_PROVIDER_SITE_OTHER): Payer: Medicaid Other

## 2018-08-12 ENCOUNTER — Encounter: Payer: Self-pay | Admitting: Obstetrics and Gynecology

## 2018-08-12 ENCOUNTER — Other Ambulatory Visit: Payer: Self-pay

## 2018-08-12 VITALS — BP 126/90 | HR 96 | Wt 194.0 lb

## 2018-08-12 DIAGNOSIS — O09892 Supervision of other high risk pregnancies, second trimester: Secondary | ICD-10-CM

## 2018-08-12 DIAGNOSIS — Z1389 Encounter for screening for other disorder: Secondary | ICD-10-CM

## 2018-08-12 DIAGNOSIS — Z3A18 18 weeks gestation of pregnancy: Secondary | ICD-10-CM

## 2018-08-12 DIAGNOSIS — Z363 Encounter for antenatal screening for malformations: Secondary | ICD-10-CM

## 2018-08-12 DIAGNOSIS — Z98891 History of uterine scar from previous surgery: Secondary | ICD-10-CM | POA: Diagnosis not present

## 2018-08-12 DIAGNOSIS — O09212 Supervision of pregnancy with history of pre-term labor, second trimester: Secondary | ICD-10-CM | POA: Diagnosis not present

## 2018-08-12 DIAGNOSIS — Z3482 Encounter for supervision of other normal pregnancy, second trimester: Secondary | ICD-10-CM

## 2018-08-12 DIAGNOSIS — Z331 Pregnant state, incidental: Secondary | ICD-10-CM

## 2018-08-12 DIAGNOSIS — Z3402 Encounter for supervision of normal first pregnancy, second trimester: Secondary | ICD-10-CM

## 2018-08-12 LAB — POCT URINALYSIS DIPSTICK OB
Glucose, UA: NEGATIVE
KETONES UA: NEGATIVE
Leukocytes, UA: NEGATIVE
Nitrite, UA: NEGATIVE
RBC UA: NEGATIVE

## 2018-08-12 MED ORDER — HYDROXYPROGESTERONE CAPROATE 275 MG/1.1ML ~~LOC~~ SOAJ
275.0000 mg | Freq: Once | SUBCUTANEOUS | Status: AC
  Administered 2018-08-12: 275 mg via SUBCUTANEOUS

## 2018-08-12 NOTE — Progress Notes (Signed)
[place] 54+2 wks,cephalic,posterior pl gr 0,normal ovaries bilat,cx 3.6 cm,svp of fluid 4.3 cm,fhr 146 bpm,efw 205 g 21%,anatomy complete,no obvious abnormalities

## 2018-08-12 NOTE — Progress Notes (Signed)
Patient ID: Young Brim, female   DOB: 10-09-1986, 32 y.o.   MRN: [id number]    LOW-RISK PREGNANCY VISIT Patient name: Lauren Ramirez MRN [id number]  Date of birth: [birth year] Chief Complaint:   Routine Prenatal Visit (u/s today/ 17p)  History of Present Illness:   Lauren Ramirez is a 32 y.o. [phone] female at 65w1dwith an Estimated Date of Delivery: 01/12/19 being seen today for ongoing management of a low-risk pregnancy. Delivered 1 child vaginally, last pregnancy twins were C-section; 1 baby was breech.  Has Crohn's disease. Received 17P. Would like to deliver vaginally but needs to sign TOLAC forms Tolac form reviewed, given to pt . Today she reports no complaints. Contractions: Not present.  .   . denies leaking of fluid. Review of Systems:   Pertinent items are noted in HPI Denies abnormal vaginal discharge w/ itching/odor/irritation, headaches, visual changes, shortness of breath, chest pain, abdominal pain, severe nausea/vomiting, or problems with urination or bowel movements unless otherwise stated above. Pertinent History Reviewed:  Reviewed past medical,surgical, social, obstetrical and family history.  Reviewed problem list, medications and allergies. Physical Assessment:   Vitals:   08/12/18 1018  BP: 126/90  Pulse: 96  Weight: 194 lb (88 kg)  Body mass index is 31.31 kg/m.        Physical Examination:   General appearance: Well appearing, and in no distress  Mental status: Alert, oriented to person, place, and time  Skin: Warm & dry  Cardiovascular: Normal heart rate noted  Respiratory: Normal respiratory effort, no distress  Abdomen: Soft, gravid, nontender  Pelvic: Cervical exam deferred         Extremities:    Fetal Status: Fetal Heart Rate (bpm): 146 u/s        Results for orders placed or performed in visit on 08/12/18 (from the past 24 hour(s))  POC Urinalysis Dipstick OB   Collection Time: 08/12/18 10:22 AM  Result Value Ref Range   Color, UA     Clarity, UA     Glucose, UA Negative Negative   Bilirubin, UA     Ketones, UA neg    Spec Grav, UA     Blood, UA neg    pH, UA     POC Protein UA Trace Negative, Trace   Urobilinogen, UA     Nitrite, UA neg    Leukocytes, UA Negative Negative   Appearance     Odor      Assessment & Plan:  1) Low-risk pregnancy [id number] 159w1dith an Estimated Date of Delivery: 01/12/19   2) Hx cesarean delivery x 1, desired TOLAC Meds:  Meds ordered this encounter  Medications  . HYDROXYprogesterone Caproate SOAJ 275 mg   Labs/procedures today: u/s  Plan:  Continue routine obstetrical care Read and sign TOLAC form, bring back @ next appt Gets 17P every week  Follow-up: Return in about 4 weeks (around 09/09/2018) for LRTecumseh Orders Placed This Encounter  Procedures  . POC Urinalysis Dipstick OB   By signing my name below, I, MaSamul Dadaattest that this documentation has been prepared under the direction and in the presence of FeJonnie KindMD. Electronically Signed: MaMount Ephraim09/11/19. 10:49 AM.  I personally performed the services described in this documentation, which was SCRIBED in my presence. The recorded information has been reviewed and considered accurate. It has been edited as necessary during review. JoJonnie KindMD

## 2018-08-19 ENCOUNTER — Ambulatory Visit: Payer: Medicaid Other

## 2018-08-21 ENCOUNTER — Other Ambulatory Visit: Payer: Self-pay

## 2018-08-21 ENCOUNTER — Ambulatory Visit (INDEPENDENT_AMBULATORY_CARE_PROVIDER_SITE_OTHER): Payer: Medicaid Other | Admitting: *Deleted

## 2018-08-21 VITALS — BP 128/83 | HR 96 | Ht 66.0 in | Wt 190.0 lb

## 2018-08-21 DIAGNOSIS — Z8751 Personal history of pre-term labor: Secondary | ICD-10-CM | POA: Diagnosis not present

## 2018-08-21 DIAGNOSIS — Z331 Pregnant state, incidental: Secondary | ICD-10-CM

## 2018-08-21 DIAGNOSIS — Z1389 Encounter for screening for other disorder: Secondary | ICD-10-CM

## 2018-08-21 LAB — POCT URINALYSIS DIPSTICK OB
Blood, UA: NEGATIVE
Glucose, UA: NEGATIVE
KETONES UA: NEGATIVE
Leukocytes, UA: NEGATIVE
Nitrite, UA: NEGATIVE
POC,PROTEIN,UA: NEGATIVE

## 2018-08-21 MED ORDER — HYDROXYPROGESTERONE CAPROATE 275 MG/1.1ML ~~LOC~~ SOAJ
275.0000 mg | SUBCUTANEOUS | Status: DC
  Administered 2018-08-21: 275 mg via SUBCUTANEOUS

## 2018-08-21 NOTE — Progress Notes (Signed)
Pt given makena 274m SQ right arm without complications. Denies swelling in hands/feet; denies pelvic pain/pressure/contractions; denies vaginal bleeding. To return in 1 week for next injection.

## 2018-08-26 ENCOUNTER — Ambulatory Visit (INDEPENDENT_AMBULATORY_CARE_PROVIDER_SITE_OTHER): Payer: Medicaid Other | Admitting: *Deleted

## 2018-08-26 VITALS — BP 127/79 | HR 96

## 2018-08-26 DIAGNOSIS — Z8751 Personal history of pre-term labor: Secondary | ICD-10-CM | POA: Diagnosis not present

## 2018-08-26 DIAGNOSIS — Z331 Pregnant state, incidental: Secondary | ICD-10-CM

## 2018-08-26 DIAGNOSIS — Z1389 Encounter for screening for other disorder: Secondary | ICD-10-CM

## 2018-08-26 LAB — POCT URINALYSIS DIPSTICK OB
Blood, UA: NEGATIVE
Glucose, UA: NEGATIVE
KETONES UA: NEGATIVE
Leukocytes, UA: NEGATIVE
Nitrite, UA: NEGATIVE
POC,PROTEIN,UA: NEGATIVE

## 2018-08-26 NOTE — Progress Notes (Signed)
Pt

## 2018-08-31 ENCOUNTER — Telehealth: Payer: Self-pay | Admitting: Obstetrics & Gynecology

## 2018-08-31 NOTE — Telephone Encounter (Signed)
Patient called stating that she would like a call back from a nurse, pt states that she is having pressure and she is only 21 weeks tomorrow. Pt would like to know if she should go to [facility] please contact pt

## 2018-08-31 NOTE — Telephone Encounter (Signed)
Pt called stating that she has been experiencing pelvic pressure for the last hour and half. She is unable to tell if she is having contractions as well. She denies LOF, vaginal bleeding, she states that she has felt baby movement. Advised pt that with her history of preterm delivery, she should go to [facility] hospital for evaluation. Pt verbalized understanding.

## 2018-09-02 ENCOUNTER — Ambulatory Visit (INDEPENDENT_AMBULATORY_CARE_PROVIDER_SITE_OTHER): Payer: Medicaid Other

## 2018-09-02 DIAGNOSIS — O09892 Supervision of other high risk pregnancies, second trimester: Secondary | ICD-10-CM

## 2018-09-02 DIAGNOSIS — Z1389 Encounter for screening for other disorder: Secondary | ICD-10-CM

## 2018-09-02 DIAGNOSIS — Z3402 Encounter for supervision of normal first pregnancy, second trimester: Secondary | ICD-10-CM

## 2018-09-02 DIAGNOSIS — Z8751 Personal history of pre-term labor: Secondary | ICD-10-CM | POA: Diagnosis not present

## 2018-09-02 DIAGNOSIS — Z331 Pregnant state, incidental: Secondary | ICD-10-CM

## 2018-09-02 DIAGNOSIS — O09212 Supervision of pregnancy with history of pre-term labor, second trimester: Secondary | ICD-10-CM

## 2018-09-02 LAB — POCT URINALYSIS DIPSTICK OB
Glucose, UA: NEGATIVE
Ketones, UA: NEGATIVE
LEUKOCYTES UA: NEGATIVE
NITRITE UA: NEGATIVE

## 2018-09-02 NOTE — Progress Notes (Signed)
Pt here for Makena 275 mg given SQ. Tolerated well. Return 1 week for next . Pad CMA

## 2018-09-06 ENCOUNTER — Other Ambulatory Visit: Payer: Self-pay

## 2018-09-06 ENCOUNTER — Encounter (HOSPITAL_COMMUNITY): Payer: Self-pay | Admitting: *Deleted

## 2018-09-06 ENCOUNTER — Inpatient Hospital Stay (HOSPITAL_BASED_OUTPATIENT_CLINIC_OR_DEPARTMENT_OTHER): Payer: Medicaid Other

## 2018-09-06 ENCOUNTER — Inpatient Hospital Stay (HOSPITAL_COMMUNITY)
Admission: AD | Admit: 2018-09-06 | Discharge: 2018-09-08 | DRG: 807 | Disposition: A | Discharge status: Home / Self Care | Payer: Medicaid Other | Attending: Obstetrics and Gynecology | Admitting: Obstetrics and Gynecology

## 2018-09-06 DIAGNOSIS — Z3402 Encounter for supervision of normal first pregnancy, second trimester: Secondary | ICD-10-CM

## 2018-09-06 DIAGNOSIS — Z8759 Personal history of other complications of pregnancy, childbirth and the puerperium: Secondary | ICD-10-CM | POA: Diagnosis present

## 2018-09-06 DIAGNOSIS — Z3A21 21 weeks gestation of pregnancy: Secondary | ICD-10-CM | POA: Diagnosis not present

## 2018-09-06 DIAGNOSIS — O368120 Decreased fetal movements, second trimester, not applicable or unspecified: Secondary | ICD-10-CM | POA: Diagnosis not present

## 2018-09-06 DIAGNOSIS — Z87891 Personal history of nicotine dependence: Secondary | ICD-10-CM

## 2018-09-06 DIAGNOSIS — O364XX0 Maternal care for intrauterine death, not applicable or unspecified: Principal | ICD-10-CM | POA: Diagnosis present

## 2018-09-06 NOTE — MAU Provider Note (Signed)
History     CSN: [id number]  Arrival date and time: 09/06/18 2118   First Provider Initiated Contact with Patient 09/06/18 2157      Chief Complaint  Patient presents with  . Decreased Fetal Movement   HPI  Ms.  Lauren Ramirez is a 32 y.o. year old G72P0304 female at 46w6dweeks gestation who presents to MAU reporting no fetal movement since Friday 09/04/2018. She reports that she would normally feel "flutters in lower abdomen." She reports some lower abdominal pressure. She denies VB, LOF or vaginal discharge.   Past Medical History:  Diagnosis Date  . Abnormal Pap smear    ASCUS 2011  . [place]   . General counseling and advice for contraceptive management 09/06/2014  . History of preterm delivery, currently pregnant in first trimester 05/26/2018   32wk, 36wk, and 35 week twins.  17p  . Nexplanon in place 09/06/2014  . Vaginal Pap smear, abnormal     Past Surgical History:  Procedure Laterality Date  . CESAREAN SECTION  04/14/2012   Procedure: CESAREAN SECTION;  Surgeon: UOsborne Oman MD;  [facility];  Service: Gynecology;  Laterality: N/A;  primary  . DILATION AND CURETTAGE OF UTERUS    . SMALL INTESTINE SURGERY      Family History  Problem Relation Age of Onset  . Hypertension Mother   . Hypertension Maternal Grandmother   . Diabetes Maternal Grandmother   . Cancer Maternal Grandfather   . Anesthesia problems Neg Hx     Social History   Tobacco Use  . Smoking status: Former Smoker    Packs/day: 0.00    Years: 7.00    Pack years: 0.00    Types: Cigarettes    Last attempt to quit: 04/15/2014    Years since quitting: 4.3  . Smokeless tobacco: Never Used  . Tobacco comment: smokes 4-5 cig daily  Substance Use Topics  . Alcohol use: Not Currently    Comment: only occasionally; before pregnancy  . Drug use: No    Allergies: No Known Allergies  Facility-Administered Medications Prior to Admission  Medication Dose Route Frequency  Provider Last Rate Last Dose  . HYDROXYprogesterone Caproate SOAJ 275 mg  275 mg Subcutaneous Weekly BRoma Schanz [place]  275 mg at 08/21/18 1259   Medications Prior to Admission  Medication Sig Dispense Refill Last Dose  . acetaminophen (TYLENOL) 500 MG tablet Take 500 mg by mouth every 6 (six) hours as needed.   09/06/2018 at Unknown time  . alum & mag hydroxide-simeth (MAALOX/MYLANTA) 200-200-20 MG/5ML suspension Take by mouth every 6 (six) hours as needed for indigestion or heartburn.   09/06/2018 at Unknown time  . folic acid (FOLVITE) 1 MG tablet Take 1 tablet (1 mg total) by mouth daily. 30 tablet 2 Taking  . ondansetron (ZOFRAN ODT) 4 MG disintegrating tablet Take 1 tablet (4 mg total) by mouth every 6 (six) hours as needed for nausea. 30 tablet 2 09/06/2018 at Unknown time  . predniSONE (DELTASONE) 10 MG tablet Take 4 tablets [40 mg] daily with breakfast for 7 days, then 3 tablets] 30 mg daily with food for 7 days, then 2 tablets [20 mg] daily with food for 7 days, 1 tablet [10 mg] daily with food for 1 week then STOP 100 tablet 1 09/06/2018 at Unknown time  . Prenatal Vit-Fe Fumarate-FA (PRENATAL MULTIVITAMIN) TABS tablet Take 1 tablet by mouth daily. 30 tablet 3 Not Taking    Review of Systems  Constitutional: Negative.   HENT: Negative.   Eyes: Negative.   Respiratory: Negative.   Cardiovascular: Negative.   Gastrointestinal: Negative.   Endocrine: Negative.   Genitourinary:       DFM since Friday 09/04/18  Musculoskeletal: Negative.   Skin: Negative.   Allergic/Immunologic: Negative.   Neurological: Negative.   Hematological: Negative.   Psychiatric/Behavioral: Negative.    Physical Exam  BP 118/61 (BP Location: Right Arm)   Pulse (!) 119   Temp 98.4 F (36.9 C) (Oral)   Resp 18   Ht 5' 6"  (1.676 m)   Wt 87.1 kg   LMP 04/07/2018   SpO2 98%   BMI 30.99 kg/m   Physical Exam  Nursing note and vitals reviewed. Constitutional: She is oriented to person, place, and  time. She appears well-developed and well-nourished.  HENT:  Head: Normocephalic and atraumatic.  Eyes: Pupils are equal, round, and reactive to light.  Neck: Normal range of motion.  Cardiovascular: Normal rate and regular rhythm.  Respiratory: Effort normal.  GI: Soft.  Musculoskeletal: Normal range of motion.  Neurological: She is alert and oriented to person, place, and time.  Skin: Skin is warm and dry.  Psychiatric: She has a normal mood and affect. Her behavior is normal. Judgment and thought content normal.    MAU Course  Procedures  MDM Informal BS U/S: Pt informed that the ultrasound is considered a limited OB ultrasound and is not intended to be a complete ultrasound exam.  Patient also informed that the ultrasound is not being completed with the intent of assessing for fetal or placental anomalies or any pelvic abnormalities.  Explained that the purpose of today's ultrasound is to assess for  viability - non-viable fetus.  Patient acknowledges the purpose of the exam and the limitations of the study. Will get formal bedside U/S to veify.  Bedside U/S: (Preliminary results) Transverse lie (fetal head to maternal LT) / no FHTs / skin edema / abdominal ascites / pleural effusion / pericardial effusion  *Consult with Dr. Ihor Dow @ 2302 - notified of patient's complaints, assessments, lab, U/S results, tx plan offer expectant management or admission and IOL -- if chooses admission, start Cytotec 400 mcg vaginally every 4 hrs  Assessment and Plan  IUFD at 20 weeks or more of gestation - Admit to L&D - Cytotec 400 mcg vaginally every 4 hrs - V. Stann Mainland ,CNM notified of admission - assumes care of patient upon admission    Laury Deep, MSN, CNM 09/06/2018, 9:57 PM

## 2018-09-06 NOTE — MAU Note (Signed)
Pt last felt baby move day before yesterday. She is feeling some pressure, but not really in pain. Denies bleeding or vaginal discharge.

## 2018-09-07 ENCOUNTER — Encounter (HOSPITAL_COMMUNITY): Payer: Self-pay | Admitting: Obstetrics and Gynecology

## 2018-09-07 DIAGNOSIS — Z3A21 21 weeks gestation of pregnancy: Secondary | ICD-10-CM | POA: Diagnosis not present

## 2018-09-07 DIAGNOSIS — O368120 Decreased fetal movements, second trimester, not applicable or unspecified: Secondary | ICD-10-CM | POA: Diagnosis present

## 2018-09-07 DIAGNOSIS — Z87891 Personal history of nicotine dependence: Secondary | ICD-10-CM | POA: Diagnosis not present

## 2018-09-07 DIAGNOSIS — Z8759 Personal history of other complications of pregnancy, childbirth and the puerperium: Secondary | ICD-10-CM | POA: Diagnosis present

## 2018-09-07 DIAGNOSIS — O364XX0 Maternal care for intrauterine death, not applicable or unspecified: Secondary | ICD-10-CM

## 2018-09-07 LAB — CBC
HCT: 29.8 % — ABNORMAL LOW (ref 36.0–46.0)
Hemoglobin: 10 g/dL — ABNORMAL LOW (ref 12.0–15.0)
MCH: 28.6 pg (ref 26.0–34.0)
MCHC: 33.6 g/dL (ref 30.0–36.0)
MCV: 85.1 fL (ref 78.0–100.0)
PLATELETS: 297 10*3/uL (ref 150–400)
RBC: 3.5 MIL/uL — ABNORMAL LOW (ref 3.87–5.11)
RDW: 14.8 % (ref 11.5–15.5)
WBC: 6.8 10*3/uL (ref 4.0–10.5)

## 2018-09-07 LAB — TYPE AND SCREEN
ABO/RH(D): O POS
ANTIBODY SCREEN: NEGATIVE

## 2018-09-07 LAB — ABO/RH: ABO/RH(D): O POS

## 2018-09-07 LAB — RPR: RPR Ser Ql: NONREACTIVE

## 2018-09-07 MED ORDER — MISOPROSTOL 200 MCG PO TABS
400.0000 ug | ORAL_TABLET | ORAL | Status: AC | PRN
  Administered 2018-09-07 (×3): 400 ug via VAGINAL
  Filled 2018-09-07 (×3): qty 2

## 2018-09-07 MED ORDER — LACTATED RINGERS IV SOLN: 500.0000 mL | INTRAVENOUS | Status: DC | PRN

## 2018-09-07 MED ORDER — ACETAMINOPHEN 325 MG PO TABS: 650.0000 mg | ORAL_TABLET | ORAL | Status: DC | PRN

## 2018-09-07 MED ORDER — DIPHENHYDRAMINE HCL 25 MG PO CAPS: 25.0000 mg | ORAL_CAPSULE | Freq: Four times a day (QID) | ORAL | Status: DC | PRN

## 2018-09-07 MED ORDER — LIDOCAINE HCL (PF) 1 % IJ SOLN
30.0000 mL | INTRAMUSCULAR | Status: DC | PRN
  Filled 2018-09-07: qty 30

## 2018-09-07 MED ORDER — ONDANSETRON HCL 4 MG PO TABS: 4.0000 mg | ORAL_TABLET | ORAL | Status: DC | PRN

## 2018-09-07 MED ORDER — OXYTOCIN BOLUS FROM INFUSION
500.0000 mL | Freq: Once | INTRAVENOUS | Status: AC
  Administered 2018-09-07: 500 mL via INTRAVENOUS

## 2018-09-07 MED ORDER — ACETAMINOPHEN 325 MG PO TABS
650.0000 mg | ORAL_TABLET | ORAL | Status: DC | PRN
  Administered 2018-09-08: 650 mg via ORAL
  Filled 2018-09-07: qty 2

## 2018-09-07 MED ORDER — SENNOSIDES-DOCUSATE SODIUM 8.6-50 MG PO TABS: 2.0000 | ORAL_TABLET | ORAL | Status: DC

## 2018-09-07 MED ORDER — FENTANYL CITRATE (PF) 100 MCG/2ML IJ SOLN
100.0000 ug | INTRAMUSCULAR | Status: DC | PRN
  Administered 2018-09-07 (×3): 100 ug via INTRAVENOUS
  Filled 2018-09-07 (×3): qty 2

## 2018-09-07 MED ORDER — OXYCODONE-ACETAMINOPHEN 5-325 MG PO TABS: 1.0000 | ORAL_TABLET | ORAL | Status: DC | PRN

## 2018-09-07 MED ORDER — OXYCODONE-ACETAMINOPHEN 5-325 MG PO TABS: 2.0000 | ORAL_TABLET | ORAL | Status: DC | PRN

## 2018-09-07 MED ORDER — ZOLPIDEM TARTRATE 5 MG PO TABS: 5.0000 mg | ORAL_TABLET | Freq: Every evening | ORAL | Status: DC | PRN

## 2018-09-07 MED ORDER — LACTATED RINGERS IV SOLN
INTRAVENOUS | Status: DC
  Administered 2018-09-07 (×3): via INTRAVENOUS

## 2018-09-07 MED ORDER — WITCH HAZEL-GLYCERIN EX PADS: 1.0000 "application " | MEDICATED_PAD | CUTANEOUS | Status: DC | PRN

## 2018-09-07 MED ORDER — OXYTOCIN 40 UNITS IN LACTATED RINGERS INFUSION - SIMPLE MED
1.0000 m[IU]/min | INTRAVENOUS | Status: DC
  Administered 2018-09-07: 1 m[IU]/min via INTRAVENOUS

## 2018-09-07 MED ORDER — BENZOCAINE-MENTHOL 20-0.5 % EX AERO: 1.0000 "application " | INHALATION_SPRAY | CUTANEOUS | Status: DC | PRN

## 2018-09-07 MED ORDER — SIMETHICONE 80 MG PO CHEW: 80.0000 mg | CHEWABLE_TABLET | ORAL | Status: DC | PRN

## 2018-09-07 MED ORDER — DIBUCAINE 1 % RE OINT: 1.0000 "application " | TOPICAL_OINTMENT | RECTAL | Status: DC | PRN

## 2018-09-07 MED ORDER — IBUPROFEN 600 MG PO TABS: 600.0000 mg | ORAL_TABLET | Freq: Four times a day (QID) | ORAL | Status: DC

## 2018-09-07 MED ORDER — COCONUT OIL OIL
1.0000 "application " | TOPICAL_OIL | Status: DC | PRN
  Filled 2018-09-07: qty 120

## 2018-09-07 MED ORDER — TETANUS-DIPHTH-ACELL PERTUSSIS 5-2.5-18.5 LF-MCG/0.5 IM SUSP: 0.5000 mL | Freq: Once | INTRAMUSCULAR | Status: DC

## 2018-09-07 MED ORDER — HYDROMORPHONE HCL 1 MG/ML IJ SOLN
1.0000 mg | INTRAMUSCULAR | Status: DC | PRN
  Administered 2018-09-07 (×2): 1 mg via INTRAVENOUS
  Filled 2018-09-07 (×2): qty 1

## 2018-09-07 MED ORDER — PRENATAL MULTIVITAMIN CH: 1.0000 | ORAL_TABLET | Freq: Every day | ORAL | Status: DC

## 2018-09-07 MED ORDER — SOD CITRATE-CITRIC ACID 500-334 MG/5ML PO SOLN: 30.0000 mL | ORAL | Status: DC | PRN

## 2018-09-07 MED ORDER — MEASLES, MUMPS & RUBELLA VAC ~~LOC~~ INJ
0.5000 mL | INJECTION | Freq: Once | SUBCUTANEOUS | Status: DC
  Filled 2018-09-07: qty 0.5

## 2018-09-07 MED ORDER — OXYTOCIN 10 UNIT/ML IJ SOLN
10.0000 [IU] | Freq: Once | INTRAMUSCULAR | Status: DC | PRN
  Filled 2018-09-07: qty 1

## 2018-09-07 MED ORDER — ONDANSETRON HCL 4 MG/2ML IJ SOLN: 4.0000 mg | INTRAMUSCULAR | Status: DC | PRN

## 2018-09-07 MED ORDER — OXYTOCIN 40 UNITS IN LACTATED RINGERS INFUSION - SIMPLE MED
2.5000 [IU]/h | INTRAVENOUS | Status: DC
  Filled 2018-09-07: qty 1000

## 2018-09-07 NOTE — Progress Notes (Signed)
Consulted with Pt's RN, Susie, to inquire about pt status.  RN indicated pt does not want visit at this time and will keep chaplain informed if pt's status changes.  Please page as further needs arise.  Donald Prose. Elyn Peers, M.Div. [facility] Chaplain Pager [phone] Office [phone]

## 2018-09-07 NOTE — Progress Notes (Signed)
Called to room by RN as placenta delivered 2056. Placenta examined and noted to be intact. 3-vessel cord. Count complete and correct. Placenta to pathology, though patient declines additional genetic testing. Discussed option of microarray, toxo/CMV testing, but patient declines.

## 2018-09-07 NOTE — Plan of Care (Signed)
  Problem: Pain Managment: Goal: General experience of comfort will improve Outcome: Progressing   Problem: Safety: Goal: Ability to remain free from injury will improve Outcome: Progressing   Problem: Skin Integrity: Goal: Risk for impaired skin integrity will decrease Outcome: Progressing   Problem: Coping: Goal: Ability to identify and utilize appropriate coping strategies will improve Outcome: Progressing Goal: Ability to identify and utilize available support systems will improve Outcome: Progressing Goal: Will verbalize feelings Outcome: Progressing Goal: Decrease level of anxiety will Outcome: Progressing   Problem: Spiritual Needs Goal: Ability to function at adequate level Outcome: Progressing

## 2018-09-07 NOTE — Progress Notes (Signed)
Patient ID: Lauren Ramirez, female   DOB: [birth year], 32 y.o.   MRN: [id number] Doing well, light cramping  Vitals:   09/06/18 2200 09/07/18 0211 09/07/18 0925  BP: 118/61 114/68 117/70  Pulse: (!) 119 94 95  Resp: 18 16 16   Temp: 98.4 F (36.9 C) 98.8 F (37.1 C) 98.1 F (36.7 C)  TempSrc: Oral Oral Oral  SpO2: 98%    Weight: 87.1 kg    Height: 5' 6"  (1.676 m)     Dilation: Closed Effacement (%): 20 Station: Ballotable Exam by:: Carmelia Roller CNM  Will change to Oral cytotec 441mg Discussed with Dr DRosana Hoes

## 2018-09-07 NOTE — H&P (Addendum)
History     CSN: [id number]  Arrival date and time: 09/06/18 2118   First Provider Initiated Contact with Patient 09/06/18 2157      Chief Complaint  Patient presents with  . Decreased Fetal Movement   HPI  Ms.  Lauren Ramirez is a 32 y.o. year old G53P0304 female at 36w6dweeks gestation who presents to MAU reporting no fetal movement since Friday 09/04/2018. She reports that she would normally feel "flutters in lower abdomen." She reports some lower abdominal pressure. She denies VB, LOF or vaginal discharge.   Past Medical History:  Diagnosis Date  . Abnormal Pap smear    ASCUS 2011  . [place]   . General counseling and advice for contraceptive management 09/06/2014  . History of preterm delivery, currently pregnant in first trimester 05/26/2018   32wk, 36wk, and 35 week twins.  17p  . Nexplanon in place 09/06/2014  . Vaginal Pap smear, abnormal     Past Surgical History:  Procedure Laterality Date  . CESAREAN SECTION  04/14/2012   Procedure: CESAREAN SECTION;  Surgeon: UOsborne Oman MD;  [facility];  Service: Gynecology;  Laterality: N/A;  primary  . DILATION AND CURETTAGE OF UTERUS    . SMALL INTESTINE SURGERY      Family History  Problem Relation Age of Onset  . Hypertension Mother   . Hypertension Maternal Grandmother   . Diabetes Maternal Grandmother   . Cancer Maternal Grandfather   . Anesthesia problems Neg Hx     Social History   Tobacco Use  . Smoking status: Former Smoker    Packs/day: 0.00    Years: 7.00    Pack years: 0.00    Types: Cigarettes    Last attempt to quit: 04/15/2014    Years since quitting: 4.3  . Smokeless tobacco: Never Used  . Tobacco comment: smokes 4-5 cig daily  Substance Use Topics  . Alcohol use: Not Currently    Comment: only occasionally; before pregnancy  . Drug use: No    Allergies: No Known Allergies  Facility-Administered Medications Prior to Admission  Medication Dose Route Frequency  Provider Last Rate Last Dose  . HYDROXYprogesterone Caproate SOAJ 275 mg  275 mg Subcutaneous Weekly BRoma Schanz [place]  275 mg at 08/21/18 1259   Medications Prior to Admission  Medication Sig Dispense Refill Last Dose  . acetaminophen (TYLENOL) 500 MG tablet Take 500 mg by mouth every 6 (six) hours as needed.   09/06/2018 at Unknown time  . alum & mag hydroxide-simeth (MAALOX/MYLANTA) 200-200-20 MG/5ML suspension Take by mouth every 6 (six) hours as needed for indigestion or heartburn.   09/06/2018 at Unknown time  . folic acid (FOLVITE) 1 MG tablet Take 1 tablet (1 mg total) by mouth daily. 30 tablet 2 Taking  . ondansetron (ZOFRAN ODT) 4 MG disintegrating tablet Take 1 tablet (4 mg total) by mouth every 6 (six) hours as needed for nausea. 30 tablet 2 09/06/2018 at Unknown time  . predniSONE (DELTASONE) 10 MG tablet Take 4 tablets [40 mg] daily with breakfast for 7 days, then 3 tablets] 30 mg daily with food for 7 days, then 2 tablets [20 mg] daily with food for 7 days, 1 tablet [10 mg] daily with food for 1 week then STOP 100 tablet 1 09/06/2018 at Unknown time  . Prenatal Vit-Fe Fumarate-FA (PRENATAL MULTIVITAMIN) TABS tablet Take 1 tablet by mouth daily. 30 tablet 3 Not Taking    Review of Systems  Constitutional: Negative.   HENT: Negative.   Eyes: Negative.   Respiratory: Negative.   Cardiovascular: Negative.   Gastrointestinal: Negative.   Endocrine: Negative.   Genitourinary:       DFM since Friday 09/04/18  Musculoskeletal: Negative.   Skin: Negative.   Allergic/Immunologic: Negative.   Neurological: Negative.   Hematological: Negative.   Psychiatric/Behavioral: Negative.    Physical Exam  BP 118/61 (BP Location: Right Arm)   Pulse (!) 119   Temp 98.4 F (36.9 C) (Oral)   Resp 18   Ht 5' 6"  (1.676 m)   Wt 87.1 kg   LMP 04/07/2018   SpO2 98%   BMI 30.99 kg/m   Physical Exam  Nursing note and vitals reviewed. Constitutional: She is oriented to person, place, and  time. She appears well-developed and well-nourished.  HENT:  Head: Normocephalic and atraumatic.  Eyes: Pupils are equal, round, and reactive to light.  Neck: Normal range of motion.  Cardiovascular: Normal rate and regular rhythm.  Respiratory: Effort normal.  GI: Soft.  Musculoskeletal: Normal range of motion.  Neurological: She is alert and oriented to person, place, and time.  Skin: Skin is warm and dry.  Psychiatric: She has a normal mood and affect. Her behavior is normal. Judgment and thought content normal.    MAU Course  Procedures  MDM Informal BS U/S: Pt informed that the ultrasound is considered a limited OB ultrasound and is not intended to be a complete ultrasound exam.  Patient also informed that the ultrasound is not being completed with the intent of assessing for fetal or placental anomalies or any pelvic abnormalities.  Explained that the purpose of today's ultrasound is to assess for  viability - non-viable fetus.  Patient acknowledges the purpose of the exam and the limitations of the study. Will get formal bedside U/S to veify.  Bedside U/S: (Preliminary results) Transverse lie (fetal head to maternal LT) / no FHTs / skin edema / abdominal ascites / pleural effusion / pericardial effusion  *Consult with Dr. Ihor Dow @ 2302 - notified of patient's complaints, assessments, lab, U/S results, tx plan offer expectant management or admission and IOL -- if chooses admission, start Cytotec 400 mcg vaginally every 4 hrs   Assessment and Plan  IUFD at 20 weeks or more of gestation - Admit to L&D for IOL - Cytotec 400 mcg vaginally every 4 hrs  - Darrol Poke, CNM notified of admission - assumes care of patient upon admission   Laury Deep, MSN, CNM 09/06/2018, 9:57 PM

## 2018-09-07 NOTE — Progress Notes (Addendum)
Patient ID: Lauren Ramirez, female   DOB: 21-Jun-1986, 32 y.o.   MRN: [id number] Getting more uncomfortable Using Fentanyl for pain Declines epidural  Vitals:   09/07/18 1034 09/07/18 1253 09/07/18 1341 09/07/18 1454  BP: 131/79 129/76 121/64 126/75  Pulse: 93 96 (!) 101 97  Resp: 16 16 16 16   Temp:   98.6 F (37 C)   TempSrc:   Oral   SpO2:      Weight:      Height:       Dilation: 1 Effacement (%): Thick Cervical Position: Middle Station: Ballotable Exam by:: S Nix RN  Will continue cytotec for now.

## 2018-09-07 NOTE — Progress Notes (Signed)
Pt declining Chaplain care at this time, will continue to assess.

## 2018-09-07 NOTE — Progress Notes (Signed)
In to meet patient, she is uncomfortable with contractions. Per RN check, patient now 1.5/80/bulging bag. Will switch to pitocin. She declines epidural at this time.    Feliz Beam, M.D. [facility]

## 2018-09-07 NOTE — Progress Notes (Signed)
Patient comfortable. Plan of care reviewed with patient. Questions answered   Vitals:   09/06/18 2200 09/07/18 0211  BP: 118/61 114/68  Pulse: (!) 119 94  Resp: 18 16  Temp: 98.4 F (36.9 C) 98.8 F (37.1 C)  TempSrc: Oral Oral  SpO2: 98%   Weight: 87.1 kg   Height: 5' 6"  (1.676 m)    SVE: 0.5/ thick  IUFD @ [redacted]w[redacted]d  Cytotec placed for IOL. Pain medication ordered PRN.   VLajean Manes CNM 09/07/18, 5:01 AM

## 2018-09-08 ENCOUNTER — Encounter (HOSPITAL_COMMUNITY): Payer: Self-pay

## 2018-09-08 MED ORDER — IBUPROFEN 600 MG PO TABS: 600.0000 mg | ORAL_TABLET | Freq: Four times a day (QID) | ORAL | 2 refills | Status: DC | PRN

## 2018-09-08 NOTE — Discharge Instructions (Signed)
Vaginal Delivery, Care After Refer to this sheet in the next few weeks. These instructions provide you with information on caring for yourself after your delivery. Your health care provider may also give you more specific instructions. Your treatment has been planned according to current medical practices, but problems sometimes occur. Call your health care provider if you have any problems or questions after your delivery. What to expect after your delivery After your delivery, it is typical to have the following:  You may feel pain in the vaginal area for several days after delivery. If you had an incision or a vaginal tear, the area will probably continue to be tender to the touch for several weeks.  You may feel very fatigued after a vaginal delivery.  You may have vaginal bleeding and discharge that will start out red, then become pink, then yellow, then white. Altogether, this usually lasts for about 6 weeks.  The combination of having lost your baby and changing hormones from the delivery can make you feel very sad. You may also experience emotions that change very quickly. Some of the emotions people often notice after loss include: ? Anger. ? Denial. ? Guilt. ? Sorrow. ? Depression. ? Grief. ? Relationship problems.  Follow these instructions at home:  Consider seeking support for your loss. Some forms of support that you might consider include your religious leader, friends, family, a Medical illustrator, or a bereavement support group.  Take medicines only as directed by your health care provider.  Continue to use good perineal care. Good perineal care includes: ? Wiping your perineum from front to back. ? Keeping your perineum clean.  Do not use tampons or douche until your health care provider says it is okay.  Shower, wash your hair, and take tub baths as directed by your health care provider.  Wear a well-fitting bra that provides breast support.  Drink enough  fluids to keep your urine clear or pale yellow.  Eat healthy foods.  Eat high-fiber foods every day, such as whole grain cereals and breads, brown rice, beans, and fresh fruits and vegetables. These foods may help prevent or relieve constipation.  Follow your health care provider's directions about resuming activities such as climbing stairs, driving, lifting, exercising, or traveling.  Increase your activities gradually.  Talk to your health care provider about resuming sexual activities. This depends on your risk of infection, your rate of healing, and your comfort and desire to resume sexual activity.  Try to have someone help you with your household activities for at least a few days after you leave the hospital.  Rest as much as possible.  Keep all of your scheduled postpartum appointments. It is very important to keep your scheduled follow-up appointments. At these appointments, your health care provider will be checking to make sure that you are healing physically and emotionally.  Do not drink alcohol, especially if you are taking medicine to relieve pain.  Do not use any tobacco products including cigarettes, chewing tobacco, or electronic cigarettes. If you need help quitting, ask your health care provider.  Do not use illegal drugs. Contact a health care provider if:  You feel sad or depressed.  You have thoughts of hurting yourself.  You are having trouble eating or sleeping.  You cannot enjoy the things in life you have previously enjoyed.  You are passing large clots from your vagina. Save any clots to show your health care provider.  You have a bad smelling discharge from your vagina.  You have trouble urinating.  You are urinating frequently.  You have pain when you urinate.  You have a change in your bowel movements.  You have increasing redness, pain, or swelling near your incision or vaginal tear.  You have pus draining from your incision or vaginal  tear.  Your incision or vaginal tear is separating.  You have painful, hard, or reddened breasts.  You have a severe headache.  You have blurred vision or see spots.  You are dizzy or light-headed.  You have a rash.  You have nausea or vomiting.  You have not had a menstrual period by the 12th week after delivery.  You have a fever. Get help right away if:  You are concerned that you may hurt yourself or you are considering suicide.  You have persistent pain.  You have chest pain.  You have shortness of breath.  You faint.  You have leg pain.  You have stomach pain.  Your vaginal bleeding saturates two or more sanitary pads in 1 hour. This information is not intended to replace advice given to you by your health care provider. Make sure you discuss any questions you have with your health care provider. Document Released: 04/04/2014 Document Revised: 04/25/2016 Document Reviewed: 01/06/2014 Elsevier Interactive Patient Education  2018 [employer].

## 2018-09-08 NOTE — Progress Notes (Signed)
Pt discharged with printed instructions. Pt verbalized an understanding. No concerns noted. Toya Smothers, RN

## 2018-09-08 NOTE — Progress Notes (Signed)
CSW received consult due to IUFD.  CSW available for support secondary to [facility] and will await call from [place] before becoming involved.  CSW screening out referral at this time.  Laurey Arrow, MSW, LCSW Clinical Social Work [phone]

## 2018-09-08 NOTE — Progress Notes (Signed)
I offered support to pt's mother, pt was unavailable and did not wish to speak.  Lauren Ramirez lives with her mother along with her 4 children.  She has 4 brothers who are very supportive along with her mother and grandmother.  I offered grief support to pt's mother who was appreciative.  They are aware of [facility] to help the children through their grief and they are aware of our ongoing availability for support.    [place], Bcc Pager, [phone] 10:51 AM    09/08/18 1000  Clinical Encounter Type  Visited With Family  Visit Type Spiritual support  Spiritual Encounters  Spiritual Needs Grief support  Stress Factors  Family Stress Factors Loss

## 2018-09-08 NOTE — Discharge Summary (Signed)
Postpartum Discharge Summary     Patient Name: Lauren Ramirez DOB: December 30, 1985 MRN: [id number]  Date of admission: 09/06/2018 Delivering Provider: Sloan Ramirez   Date of discharge: 09/08/2018  Admitting diagnosis: decrease fetal movements Intrauterine pregnancy: [redacted]w[redacted]d    Secondary diagnosis:  Active Problems:   IUFD at 261weeks or more of gestation  Additional problems:None     Discharge diagnosis: IUFD delivered at 258w6d                                                                                              Post partum procedures:None  Augmentation: Pitocin and Cytotec  [facility]  [facility] of Labor With Vaginal Delivery   [age].o. yo [id number] 2124w6ds admitted to the hospital 09/06/2018 for induction of labor.  Indication for induction: IUFD.  Patient had an uncomplicated labor course as follows: Membrane Rupture Time/Date: 8:04 PM ,09/07/2018   Intrapartum Procedures: Episiotomy: None [1]                                         Lacerations:  None [1]  Patient had delivery of a Non Viable infant on 09/07/2018.  Information for the patient's newborn:  PerJalexia, Lalli3[[id number]]elivery Method: Vaginal, Breech(Filed from Delivery Summary) Details of delivery can be found in separate delivery note. Patient was given adequate support, declined SW and Chaplain services.  She declined fetopsy or other analysis.  Uncomplicated postpartum course. Patient is discharged home 09/08/18 with [facility] Physical exam  Vitals:   09/07/18 2346 09/08/18 0104 09/08/18 0522 09/08/18 0834  BP: 125/78 110/70 104/65 117/85  Pulse: (!) 104 79 79 85  Resp: 18 16 16    Temp:  98.5 F (36.9 C) 98.4 F (36.9 C) 98.4 F (36.9 C)  TempSrc: Oral Oral Oral Oral  SpO2: 100% 99% 99% 100%  Weight:      Height:       General: alert, cooperative and no distress Lochia: appropriate Uterine Fundus: firm DVT  Evaluation: No evidence of DVT seen on physical exam. Negative Homan's sign. No cords or calf tenderness. Labs: Lab Results  Component Value Date   WBC 6.8 09/07/2018   HGB 10.0 (L) 09/07/2018   HCT 29.8 (L) 09/07/2018   MCV 85.1 09/07/2018   PLT 297 09/07/2018   CMP Latest Ref Rng & Units 07/29/2018  Glucose 70 - 99 mg/dL 166(H)  BUN 6 - 20 mg/dL <5(L)  Creatinine 0.44 - 1.00 mg/dL 0.48  Sodium 135 - 145 mmol/L 132(L)  Potassium 3.5 - 5.1 mmol/L 4.0  Chloride 98 - 111 mmol/L 104  CO2 22 - 32 mmol/L 20(L)  Calcium 8.9 - 10.3 mg/dL 8.4(L)  Total Protein 6.5 - 8.1 g/dL 6.2(L)  Total Bilirubin 0.3 - 1.2 mg/dL 1.0  Alkaline Phos 38 - 126 U/L 68  AST 15 - 41 U/L 9(L)  ALT 0 - 44 U/L 9  Discharge instruction: per After Visit Summary and "Baby and Me Booklet".  After visit meds:  Allergies as of 09/08/2018   No Known Allergies     Medication List    STOP taking these medications   alum & mag hydroxide-simeth 200-200-20 MG/5ML suspension Commonly known as:  MAALOX/MYLANTA   folic acid 1 MG tablet Commonly known as:  FOLVITE   ondansetron 4 MG disintegrating tablet Commonly known as:  ZOFRAN-ODT   predniSONE 10 MG tablet Commonly known as:  DELTASONE   prenatal multivitamin Tabs tablet     TAKE these medications   acetaminophen 500 MG tablet Commonly known as:  TYLENOL Take 500 mg by mouth every 6 (six) hours as needed for mild pain or headache.   ibuprofen 600 MG tablet Commonly known as:  ADVIL,MOTRIN Take 1 tablet (600 mg total) by mouth every 6 (six) hours as needed.       Diet: routine diet  Activity: Advance as tolerated. Pelvic rest for 6 weeks.   Outpatient follow up:2 weeks   09/08/2018 Verita Schneiders, MD

## 2018-09-09 ENCOUNTER — Encounter: Payer: Medicaid Other | Admitting: Advanced Practice Midwife

## 2018-09-21 ENCOUNTER — Ambulatory Visit: Payer: Medicaid Other | Admitting: Women's Health

## 2018-09-24 ENCOUNTER — Ambulatory Visit: Payer: Medicaid Other | Admitting: Obstetrics and Gynecology

## 2018-09-30 ENCOUNTER — Telehealth: Payer: Self-pay | Admitting: Advanced Practice Midwife

## 2018-09-30 NOTE — Telephone Encounter (Signed)
Left message @ 11:05 am. JSY

## 2018-09-30 NOTE — Telephone Encounter (Signed)
Patient called stating that she would like a call back from a nurse, pt states  That she has a question. Pt did not state the reason why. Please contact pt

## 2018-09-30 NOTE — Telephone Encounter (Signed)
Spoke with pt. Pt had vaginal delivery on 09/07/18. Pt concerned that she is still bleeding. Changes pad every 3-4 hours. Pt has postpartum visit scheduled for tomorrow but asked to change appt to Friday. I transferred call to front desk. Pt states she is not having any other symptoms. No dizziness. Pt is coming in tomorrow at 8:45 am. Advised if anything changes, feel free to call us back. Pt voiced understanding. [place]

## 2018-10-01 ENCOUNTER — Ambulatory Visit: Payer: Medicaid Other | Admitting: Family Medicine

## 2018-10-01 ENCOUNTER — Ambulatory Visit: Payer: Medicaid Other | Admitting: Advanced Practice Midwife

## 2018-10-01 ENCOUNTER — Encounter: Payer: Self-pay | Admitting: Family Medicine

## 2018-10-01 VITALS — BP 124/78 | HR 93

## 2018-10-01 DIAGNOSIS — O09212 Supervision of pregnancy with history of pre-term labor, second trimester: Secondary | ICD-10-CM

## 2018-10-01 DIAGNOSIS — O364XX0 Maternal care for intrauterine death, not applicable or unspecified: Secondary | ICD-10-CM

## 2018-10-01 DIAGNOSIS — Z98891 History of uterine scar from previous surgery: Secondary | ICD-10-CM | POA: Diagnosis not present

## 2018-10-01 DIAGNOSIS — Z8719 Personal history of other diseases of the digestive system: Secondary | ICD-10-CM

## 2018-10-01 DIAGNOSIS — O09892 Supervision of other high risk pregnancies, second trimester: Secondary | ICD-10-CM

## 2018-10-01 MED ORDER — NORELGESTROMIN-ETH ESTRADIOL 150-35 MCG/24HR TD PTWK: 1.0000 | MEDICATED_PATCH | TRANSDERMAL | 12 refills | Status: DC

## 2018-10-01 NOTE — Patient Instructions (Signed)
[email or web address]

## 2018-10-01 NOTE — Progress Notes (Signed)
GYNECOLOGY PROBLEM  VISIT ENCOUNTER NOTE  Subjective:   Lauren Ramirez is a 32 y.o. [id number] female here for a a problem GYN visit/f.y after recent IUFD. Patient was 28w6dand had IUFD with IOL. She reports she has been doing "ok." reports bleeding for > 1 week then stopped for 3 days and then resumed bleeding. Reports it is mostly spotting. No abdominal pain, foul smelling discharge.  Denies discharge, pelvic pain, problems with intercourse or other gynecologic concerns.  Last unprotected sex was 1.5 weeks ago    ELesothoPostnatal Depression Scale - 10/01/18 0907      Edinburgh Postnatal Depression Scale:  In the Past 7 Days   I have been able to laugh and see the funny side of things.  0    I have looked forward with enjoyment to things.  1    I have blamed myself unnecessarily when things went wrong.  1    I have been anxious or worried for no good reason.  0    I have felt scared or panicky for no good reason.  1    Things have been getting on top of me.  1    I have been so unhappy that I have had difficulty sleeping.  0    I have felt sad or miserable.  1    I have been so unhappy that I have been crying.  1    The thought of harming myself has occurred to me.  0    Edinburgh Postnatal Depression Scale Total  6      The following portions of the patient's history were reviewed and updated as appropriate: allergies, current medications, past family history, past medical history, past social history, past surgical history and problem list.  Review of Systems Pertinent items are noted in HPI.   Objective:  BP 124/78 (BP Location: Right Arm, Patient Position: Sitting, Cuff Size: Normal)   Pulse 93  Gen: well appearing, NAD HEENT: no scleral icterus CV: RR Lung: Normal WOB Ext: warm well perfused  PELVIC: Normal appearing external genitalia; normal appearing vaginal mucosa and cervix.  No abnormal discharge noted.  Normal uterine size, no other palpable masses, no  uterine or adnexal tenderness. Dark blood present in vaginal vault    Assessment and Plan:   1. History of Crohn's disease Currently without flare. Counseled to continue her regular follow up with GI. Patient has concerns about prednisone vs flare contributing to IUFD.   Reviewed risk of IUFD and preterm birth with IBD conditions in general.   2. IUFD at 240 weeksor more of gestation  Doing well emotionally but appropriately tearful  Discussed reproductive life plan and patient does not desire pregnancy immediately  Discussed contraceptive options in tiered based fashion and patient opted for contraceptive patch. She was counseled on the increased risk of DVT with this method in the setting of smoking which she resumed after her IUFD. She voiced understanding about the risk and feels patch is the best option for her despite counseling about nuva ring, depo, nexplanon and IUD. She may consider nexplanon in the future.   Given information for [facility] pregnancy loss support group.   4. Risk of pregnancy Patient had unprotected sex 1.5 weeks ago Recommended home UPT in 1 week and abstaining until that time If urine pregnancy negative, patient will start contraceptive patch.   >50% of this 25 min visit was spent in direct support and counseling about IUFD and contraceptive plan.  Please refer to After Visit Summary for other counseling recommendations.   Return in about 1 year (around 10/02/2019) for Yearly wellness exam.  Caren Macadam, MD, MPH, ABFM Attending [place] for [facility]

## 2018-12-01 ENCOUNTER — Telehealth: Payer: Self-pay | Admitting: Adult Health

## 2018-12-01 NOTE — Telephone Encounter (Signed)
Would like for a nurse to give her a call

## 2018-12-01 NOTE — Telephone Encounter (Signed)
Patient states she normally changes her bc patch on Tuesdays but did not pick up until yesterday.  She is wondering if she needs to change her days. Advised to keep schedule as she has been changing it.

## 2018-12-18 ENCOUNTER — Ambulatory Visit: Payer: Medicaid Other | Admitting: Adult Health

## 2019-01-04 ENCOUNTER — Ambulatory Visit: Payer: Medicaid Other | Admitting: Adult Health

## 2019-01-06 ENCOUNTER — Encounter (HOSPITAL_COMMUNITY): Payer: Self-pay | Admitting: Emergency Medicine

## 2019-01-06 ENCOUNTER — Ambulatory Visit (HOSPITAL_COMMUNITY)
Admission: EM | Admit: 2019-01-06 | Discharge: 2019-01-06 | Disposition: A | Discharge status: Home / Self Care | Payer: Medicaid Other | Attending: Emergency Medicine | Admitting: Emergency Medicine

## 2019-01-06 DIAGNOSIS — H1032 Unspecified acute conjunctivitis, left eye: Secondary | ICD-10-CM

## 2019-01-06 NOTE — ED Notes (Signed)
Patient able to ambulate independently  

## 2019-01-06 NOTE — Discharge Instructions (Signed)
Continue tobramycin eyedrops as directed for left eye for 7 days. You can also use over the counter artificial tear gel at night. Wait 10-15 minutes between drops, always use artificial tear gel last, as it prevents drops from penetrating through. Lid scrubs and warm compresses as directed. Monitor for any worsening of symptoms, changes in vision, sensitivity to light, eye swelling, painful eye movement, follow up with ophthalmology for further evaluation.

## 2019-01-06 NOTE — ED Triage Notes (Signed)
Pt here for redness and irritation to left eye; pt recently had in right eye and took meds with relief but no relief for left eye

## 2019-01-06 NOTE — ED Provider Notes (Signed)
[place]    CSN: [id number] Arrival date & time: 01/06/19  1245     History   Chief Complaint Chief Complaint  Patient presents with  . Eye Pain    HPI Lauren Ramirez is a 33 y.o. female.   33 year old female comes in for few day history of left eye redness. States she had similar symptoms a week ago with her right eye, and was given tobramycin eye drops that resolved symptoms. She noticed left eye redness worsening with some irritation and started tobramycin today. She has mild crusting in the morning, some eye itching. Denies vision changes, photophobia. Denies contact lens, glasses use. Denies URI symptoms such as cough, congestion, sore throat. Denies fever, chills, night sweats.       Past Medical History:  Diagnosis Date  . Abnormal Pap smear    ASCUS 2011  . Crohn's disease ([place])   . General counseling and advice for contraceptive management 09/06/2014  . History of preterm delivery, currently pregnant in first trimester 05/26/2018   32wk, 36wk, and 35 week twins.  17p  . Nexplanon in place 09/06/2014  . Vaginal Pap smear, abnormal     Patient Active Problem List   Diagnosis Date Noted  . IUFD at 41 weeks or more of gestation 09/07/2018  . Exacerbation of Crohn's disease ([place]) 07/28/2018  . History of preterm delivery, currently pregnant in second trimester 05/26/2018  . History of cesarean section, low transverse 05/26/2018  . History of Crohn's disease 05/26/2018    Past Surgical History:  Procedure Laterality Date  . CESAREAN SECTION  04/14/2012   Procedure: CESAREAN SECTION;  Surgeon: Osborne Oman, MD;  Location: [facility];  Service: Gynecology;  Laterality: N/A;  primary  . DILATION AND CURETTAGE OF UTERUS    . SMALL INTESTINE SURGERY      OB History    Gravida  5   Para  4   Term  0   Preterm  4   AB  1   Living  4     SAB  0   TAB  1   Ectopic  0   Multiple  1   Live Births  4            Home  Medications    Prior to Admission medications   Medication Sig Start Date End Date Taking? Authorizing Provider  acetaminophen (TYLENOL) 500 MG tablet Take 500 mg by mouth every 6 (six) hours as needed for mild pain or headache.     [provider]  ibuprofen (ADVIL,MOTRIN) 600 MG tablet Take 1 tablet (600 mg total) by mouth every 6 (six) hours as needed. Patient not taking: Reported on 10/01/2018 09/08/18   Osborne Oman, MD  norelgestromin-ethinyl estradiol (ORTHO EVRA) 150-35 MCG/24HR transdermal patch Place 1 patch onto the skin once a week. 10/01/18   Caren Macadam, MD    Family History Family History  Problem Relation Age of Onset  . Hypertension Mother   . Hypertension Maternal Grandmother   . Diabetes Maternal Grandmother   . Cancer Maternal Grandfather   . Anesthesia problems Neg Hx     Social History Social History   Tobacco Use  . Smoking status: Former Smoker    Packs/day: 0.00    Years: 7.00    Pack years: 0.00    Types: Cigarettes    Last attempt to quit: 04/15/2014    Years since quitting: 4.7  . Smokeless tobacco: Never  Used  . Tobacco comment: smokes 4-5 cig daily  Substance Use Topics  . Alcohol use: Not Currently    Comment: only occasionally; before pregnancy  . Drug use: No     Allergies   Patient has no known allergies.   Review of Systems Review of Systems  Reason unable to perform ROS: See HPI as above.     Physical Exam Triage Vital Signs ED Triage Vitals  Enc Vitals Group     BP 01/06/19 1043 127/81     Pulse Rate 01/06/19 1043 99     Resp 01/06/19 1043 18     Temp 01/06/19 1043 98.3 F (36.8 C)     Temp Source 01/06/19 1043 Oral     SpO2 01/06/19 1043 97 %     Weight --      Height --      Head Circumference --      Peak Flow --      Pain Score 01/06/19 1044 5     Pain Loc --      Pain Edu? --      Excl. in [place]? --    No data found.  Updated Vital Signs BP 127/81 (BP Location: Left Arm)   Pulse 99    Temp 98.3 F (36.8 C) (Oral)   Resp 18   SpO2 97%   Visual Acuity Right Eye Distance:   20/20 ([place]) Left Eye Distance:   20/20 ([place]) Bilateral Distance:    Right Eye Near:   Left Eye Near:    Bilateral Near:     Physical Exam Constitutional:      General: She is not in acute distress.    Appearance: She is well-developed. She is not ill-appearing, toxic-appearing or diaphoretic.  HENT:     Head: Normocephalic and atraumatic.     Nose: Nose normal. No congestion or rhinorrhea.  Eyes:     General: Lids are normal. Lids are everted, no foreign bodies appreciated.        Right eye: No discharge or hordeolum.        Left eye: No discharge or hordeolum.     Conjunctiva/sclera:     Right eye: Right conjunctiva is not injected.     Left eye: Left conjunctiva is injected.     Pupils: Pupils are equal, round, and reactive to light.     Comments: No photophobia on exam.  No ciliary injection.  Neurological:     Mental Status: She is alert and oriented to person, place, and time.      UC Treatments / Results  Labs (all labs ordered are listed, but only abnormal results are displayed) Labs Reviewed - No data to display  EKG None  Radiology No results found.  Procedures Procedures (including critical care time)  Medications Ordered in UC Medications - No data to display  Initial Impression / Assessment and Plan / UC Course  I have reviewed the triage vital signs and the nursing notes.  Pertinent labs & imaging results that were available during my care of the patient were reviewed by me and considered in my medical decision making (see chart for details).    Patient can continue tobramycin eyedrops for the next 7 days as directed. Artificial tears gel as directed. Lid scrubs and warm compresses as directed. Patient to follow up with ophthalmology if symptoms worsens or does not improve. Return precautions given.    Final Clinical Impressions(s) / UC Diagnoses   Final  diagnoses:  Acute conjunctivitis of left eye, unspecified acute conjunctivitis type    ED Prescriptions    None        Ok Edwards, PA-C 01/06/19 1127

## 2019-01-19 ENCOUNTER — Ambulatory Visit: Payer: Medicaid Other | Admitting: Adult Health

## 2019-01-21 ENCOUNTER — Encounter: Payer: Self-pay | Admitting: Advanced Practice Midwife

## 2019-01-21 ENCOUNTER — Ambulatory Visit: Payer: Medicaid Other | Admitting: Advanced Practice Midwife

## 2019-01-21 VITALS — BP 128/89 | HR 83 | Ht 66.0 in | Wt 191.0 lb

## 2019-01-21 DIAGNOSIS — Z30017 Encounter for initial prescription of implantable subdermal contraceptive: Secondary | ICD-10-CM | POA: Diagnosis not present

## 2019-01-21 NOTE — Progress Notes (Signed)
[facility] Visit  Patient name: Lauren Ramirez MRN [id number]  Date of birth: Jul 21, 1986  CC & HPI:  Lauren Ramirez is a 33 y.o.  female presenting today for birth control.  Was on patch, hates it d/t mood swings, anxiety. Wants nexplanon. Took off last patch Sunday, bleeding now.    Pertinent History Reviewed:  Medical & Surgical Hx:   Past Medical History:  Diagnosis Date  . Abnormal Pap smear    ASCUS 2011  . Crohn's disease ([place])   . General counseling and advice for contraceptive management 09/06/2014  . History of preterm delivery, currently pregnant in first trimester 05/26/2018   32wk, 36wk, and 35 week twins.  17p  . Nexplanon in place 09/06/2014  . Vaginal Pap smear, abnormal    Past Surgical History:  Procedure Laterality Date  . CESAREAN SECTION  04/14/2012   Procedure: CESAREAN SECTION;  Surgeon: Osborne Oman, MD;  Location: [facility];  Service: Gynecology;  Laterality: N/A;  primary  . DILATION AND CURETTAGE OF UTERUS    . SMALL INTESTINE SURGERY     Family History  Problem Relation Age of Onset  . Hypertension Mother   . Hypertension Maternal Grandmother   . Diabetes Maternal Grandmother   . Cancer Maternal Grandfather   . Anesthesia problems Neg Hx    No current outpatient medications on file. Social History: Reviewed -  reports that she quit smoking about 4 years ago. Her smoking use included cigarettes. She smoked 0.00 packs per day for 7.00 years. She has never used smokeless tobacco.  Review of Systems:   Constitutional: Negative for fever and chills Eyes: Negative for visual disturbances Respiratory: Negative for shortness of breath, dyspnea Cardiovascular: Negative for chest pain or palpitations  Gastrointestinal: Negative for vomiting, diarrhea and constipation; no abdominal pain Genitourinary: Negative for dysuria and urgency, vaginal irritation or itching Musculoskeletal: Negative for back pain, joint pain, myalgias   Neurological: Negative for dizziness and headaches    Objective Findings:    Physical Examination: Vitals:   01/21/19 1015  BP: 128/89  Pulse: 83   General appearance - well appearing, and in no distress Mental status - alert, oriented to person, place, and time Chest:  Normal respiratory effort Heart - normal rate and regular rhythm Abdomen:  Soft, nontender Pelvic: deferred Musculoskeletal:  Normal range of motion without pain Extremities:  No edema    No results found for this or any previous visit (from the past 24 hour(s)).    Assessment & Plan:  A:   Contraception mgt P:  Abstinence until [facility]   Return in about 3 weeks (around 02/11/2019) for order and schedule nexplanon.  Christin Fudge CNM 01/21/2019 11:55 AM

## 2019-01-21 NOTE — Patient Instructions (Signed)
Etonogestrel implant What is this medicine? ETONOGESTREL (et oh noe JES trel) is a contraceptive (birth control) device. It is used to prevent pregnancy. It can be used for up to 3 years. This medicine may be used for other purposes; ask your health care provider or pharmacist if you have questions. COMMON BRAND NAME(S): Implanon, Nexplanon What should I tell my health care provider before I take this medicine? They need to know if you have any of these conditions: -abnormal vaginal bleeding -blood vessel disease or blood clots -breast, cervical, endometrial, ovarian, liver, or uterine cancer -diabetes -gallbladder disease -heart disease or recent heart attack -high blood pressure -high cholesterol or triglycerides -kidney disease -liver disease -migraine headaches -seizures -stroke -tobacco smoker -an unusual or allergic reaction to etonogestrel, anesthetics or antiseptics, other medicines, foods, dyes, or preservatives -pregnant or trying to get pregnant -breast-feeding How should I use this medicine? This device is inserted just under the skin on the inner side of your upper arm by a health care professional. Talk to your pediatrician regarding the use of this medicine in children. Special care may be needed. Overdosage: If you think you have taken too much of this medicine contact a poison control center or emergency room at once. NOTE: This medicine is only for you. Do not share this medicine with others. What if I miss a dose? This does not apply. What may interact with this medicine? Do not take this medicine with any of the following medications: -amprenavir -fosamprenavir This medicine may also interact with the following medications: -acitretin -aprepitant -armodafinil -bexarotene -bosentan -carbamazepine -certain medicines for fungal infections like fluconazole, ketoconazole, itraconazole and voriconazole -certain medicines to treat hepatitis, HIV or  AIDS -cyclosporine -felbamate -griseofulvin -lamotrigine -modafinil -oxcarbazepine -phenobarbital -phenytoin -primidone -rifabutin -rifampin -rifapentine -St. John's wort -topiramate This list may not describe all possible interactions. Give your health care provider a list of all the medicines, herbs, non-prescription drugs, or dietary supplements you use. Also tell them if you smoke, drink alcohol, or use illegal drugs. Some items may interact with your medicine. What should I watch for while using this medicine? This product does not protect you against HIV infection (AIDS) or other sexually transmitted diseases. You should be able to feel the implant by pressing your fingertips over the skin where it was inserted. Contact your doctor if you cannot feel the implant, and use a non-hormonal birth control method (such as condoms) until your doctor confirms that the implant is in place. Contact your doctor if you think that the implant may have broken or become bent while in your arm. You will receive a user card from your health care provider after the implant is inserted. The card is a record of the location of the implant in your upper arm and when it should be removed. Keep this card with your health records. What side effects may I notice from receiving this medicine? Side effects that you should report to your doctor or health care professional as soon as possible: -allergic reactions like skin rash, itching or hives, swelling of the face, lips, or tongue -breast lumps, breast tissue changes, or discharge -breathing problems -changes in emotions or moods -if you feel that the implant may have broken or bent while in your arm -high blood pressure -pain, irritation, swelling, or bruising at the insertion site -scar at site of insertion -signs of infection at the insertion site such as fever, and skin redness, pain or discharge -signs and symptoms of a blood clot such  as breathing  problems; changes in vision; chest pain; severe, sudden headache; pain, swelling, warmth in the leg; trouble speaking; sudden numbness or weakness of the face, arm or leg -signs and symptoms of liver injury like dark yellow or brown urine; general ill feeling or flu-like symptoms; light-colored stools; loss of appetite; nausea; right upper belly pain; unusually weak or tired; yellowing of the eyes or skin -unusual vaginal bleeding, discharge Side effects that usually do not require medical attention (report to your doctor or health care professional if they continue or are bothersome): -acne -breast pain or tenderness -headache -irregular menstrual bleeding -nausea This list may not describe all possible side effects. Call your doctor for medical advice about side effects. You may report side effects to FDA at 1-800-FDA-1088. Where should I keep my medicine? This drug is given in a hospital or clinic and will not be stored at home. NOTE: This sheet is a summary. It may not cover all possible information. If you have questions about this medicine, talk to your doctor, pharmacist, or health care provider.  2019 Elsevier/Gold Standard (2017-10-07 14:11:42)

## 2019-02-03 NOTE — Telephone Encounter (Signed)
Provider notified

## 2019-02-11 ENCOUNTER — Encounter: Payer: Self-pay | Admitting: Advanced Practice Midwife

## 2019-02-16 ENCOUNTER — Encounter: Payer: Medicaid Other | Admitting: Advanced Practice Midwife

## 2019-02-22 ENCOUNTER — Telehealth: Payer: Self-pay | Admitting: *Deleted

## 2019-02-22 ENCOUNTER — Encounter: Payer: Medicaid Other | Admitting: Advanced Practice Midwife

## 2019-02-22 NOTE — Telephone Encounter (Signed)
Called patient and informed her of COVID 19 restrictions. Pt denies any contact or symptoms associated with COVID19.

## 2019-02-23 ENCOUNTER — Encounter: Payer: Medicaid Other | Admitting: Women's Health

## 2019-04-23 ENCOUNTER — Telehealth: Payer: Self-pay

## 2019-04-23 NOTE — Telephone Encounter (Signed)
[facility] Dept sent fax requesting records from [facility] 04/23/19  KLM

## 2019-05-06 NOTE — Telephone Encounter (Signed)
Rec'd from [facility] forwarded 11 pages to Historical Provider of [facility] Dept

## 2019-05-11 ENCOUNTER — Telehealth: Payer: Self-pay | Admitting: Gastroenterology

## 2019-05-11 NOTE — Telephone Encounter (Signed)
DOD 5-28 PM   We have received records to be seen in our office. Patient would like to transfer care to [place]. Records will be sent to DOD for 04-29-19 to be reviewed. Please advise for scheduling.

## 2019-05-12 NOTE — Telephone Encounter (Signed)
Dr. Loletha Carrow Decline recommended to go to [facility] or [facility].

## 2019-06-02 HISTORY — PX: HEMICOLECTOMY W/ OSTOMY: SHX1738

## 2019-07-28 ENCOUNTER — Other Ambulatory Visit: Payer: Self-pay

## 2019-07-28 ENCOUNTER — Emergency Department (HOSPITAL_COMMUNITY)
Admission: EM | Admit: 2019-07-28 | Discharge: 2019-07-28 | Disposition: A | Discharge status: Home / Self Care | Payer: Medicaid Other | Attending: Emergency Medicine | Admitting: Emergency Medicine

## 2019-07-28 DIAGNOSIS — Z87891 Personal history of nicotine dependence: Secondary | ICD-10-CM | POA: Diagnosis not present

## 2019-07-28 DIAGNOSIS — K5090 Crohn's disease, unspecified, without complications: Secondary | ICD-10-CM | POA: Insufficient documentation

## 2019-07-28 DIAGNOSIS — K9403 Colostomy malfunction: Secondary | ICD-10-CM | POA: Insufficient documentation

## 2019-07-28 NOTE — ED Provider Notes (Signed)
[facility] EMERGENCY DEPARTMENT Provider Note   CSN: [id number] Arrival date & time: 07/28/19  0110     History   Chief Complaint Chief Complaint  Patient presents with  . ostomy leakage    HPI Lauren Ramirez is a 33 y.o. female a history of Crohn's disease with colostomy who presents to the emergency department with a chief complaint of colostomy leak.  The patient reports that her colostomy bag began leaking earlier tonight.  Her primary concern is that she ran out of replacement ostomy supplies.  She reports that she followed up with her nursing team and was told that her supplies have been in route for the last few days, but they have not yet arrived and have been delayed for more than 2 days.  She anticipates that they will arrive tomorrow, but is unsure if they will be delayed again.  No fever, chills, nausea, vomiting, increased ostomy output, or abdominal pain.  No other concerns at this time.     The history is provided by the patient. No language interpreter was used.    Past Medical History:  Diagnosis Date  . Abnormal Pap smear    ASCUS 2011  . Crohn's disease ([place])   . General counseling and advice for contraceptive management 09/06/2014  . History of preterm delivery, currently pregnant in first trimester 05/26/2018   32wk, 36wk, and 35 week twins.  17p  . Nexplanon in place 09/06/2014  . Vaginal Pap smear, abnormal     Patient Active Problem List   Diagnosis Date Noted  . IUFD at 52 weeks or more of gestation 09/07/2018  . Exacerbation of Crohn's disease ([place]) 07/28/2018  . History of preterm delivery, currently pregnant in second trimester 05/26/2018  . History of cesarean section, low transverse 05/26/2018  . History of Crohn's disease 05/26/2018    Past Surgical History:  Procedure Laterality Date  . CESAREAN SECTION  04/14/2012   Procedure: CESAREAN SECTION;  Surgeon: Osborne Oman, MD;  Location: [facility];  Service:  Gynecology;  Laterality: N/A;  primary  . DILATION AND CURETTAGE OF UTERUS    . SMALL INTESTINE SURGERY       OB History    Gravida  5   Para  4   Term  0   Preterm  4   AB  1   Living  4     SAB  0   TAB  1   Ectopic  0   Multiple  1   Live Births  4            Home Medications    Prior to Admission medications   Not on File    Family History Family History  Problem Relation Age of Onset  . Hypertension Mother   . Hypertension Maternal Grandmother   . Diabetes Maternal Grandmother   . Cancer Maternal Grandfather   . Anesthesia problems Neg Hx     Social History Social History   Tobacco Use  . Smoking status: Former Smoker    Packs/day: 0.00    Years: 7.00    Pack years: 0.00    Types: Cigarettes    Quit date: 04/15/2014    Years since quitting: 5.2  . Smokeless tobacco: Never Used  . Tobacco comment: smokes 4-5 cig daily  Substance Use Topics  . Alcohol use: Not Currently    Comment: only occasionally; before pregnancy  . Drug use: No     Allergies  Patient has no known allergies.   Review of Systems Review of Systems  Constitutional: Negative for activity change.  Respiratory: Negative for shortness of breath.   Cardiovascular: Negative for chest pain.  Gastrointestinal: Negative for abdominal distention, abdominal pain, anal bleeding, blood in stool, nausea and vomiting.  Musculoskeletal: Negative for back pain.  Skin: Negative for rash.     Physical Exam Updated Vital Signs BP 120/67 (BP Location: Right Arm)   Pulse 87   Temp 98.1 F (36.7 C) (Oral)   Resp 16   SpO2 98%   Physical Exam Vitals signs and nursing note reviewed.  Constitutional:      General: She is not in acute distress.    Appearance: She is not ill-appearing, toxic-appearing or diaphoretic.  HENT:     Head: Normocephalic.  Eyes:     Conjunctiva/sclera: Conjunctivae normal.  Neck:     Musculoskeletal: Neck supple.  Cardiovascular:     Rate  and Rhythm: Normal rate and regular rhythm.     Heart sounds: No murmur. No friction rub. No gallop.   Pulmonary:     Effort: Pulmonary effort is normal. No respiratory distress.  Abdominal:     General: There is no distension.     Palpations: Abdomen is soft. There is no mass.     Tenderness: There is no abdominal tenderness. There is no right CVA tenderness, left CVA tenderness, guarding or rebound.     Hernia: No hernia is present.     Comments: Stoma is pink.  No bleeding or discharge is noted.  No increased output noted in ostomy bag.  Abdomen is soft nondistended.  Skin:    General: Skin is warm.     Findings: No rash.  Neurological:     Mental Status: She is alert.  Psychiatric:        Behavior: Behavior normal.      ED Treatments / Results  Labs (all labs ordered are listed, but only abnormal results are displayed) Labs Reviewed - No data to display  EKG None  Radiology No results found.  Procedures Procedures (including critical care time)  Medications Ordered in ED Medications - No data to display   Initial Impression / Assessment and Plan / ED Course  I have reviewed the triage vital signs and the nursing notes.  Pertinent labs & imaging results that were available during my care of the patient were reviewed by me and considered in my medical decision making (see chart for details).        33 year old female with history of Crohn's disease presenting with colostomy bag leak.  She reports that she spoke with her nursing team earlier today and the supplies have been in route, but have been delayed for several days.  She anticipates arrival tomorrow, but is uncertain as she was told they should have arrived daily for the last 2 days.  Colostomy supplies were given and bag was replaced by the patient in the ER without difficulty.  She has no other complaints at this time.  Advised to follow-up with her nursing team regarding any further delays in the supplies.   All questions answered.  She is hemodynamically stable and in no acute distress.  Safe to discharge home with outpatient follow-up.  Final Clinical Impressions(s) / ED Diagnoses   Final diagnoses:  Colostomy malfunction [facility])    ED Discharge Orders    None       Muadh Creasy A, PA-C 32/35/57 [id number]    Roxanne Mins,  Shanon Brow, MD 07/28/19 [phone]

## 2019-07-28 NOTE — Discharge Instructions (Addendum)
Thank you for allowing me to care for you today in the Emergency Department.   Follow-up with your gastroenterology team if your supplies continue to be delayed.  Return to the emergency department for new or worsening symptoms.

## 2019-07-28 NOTE — ED Triage Notes (Signed)
Per pt she is just having leakage and needs something to hold her ostomy bag better. No supplies pt said they were suppose to come today and did not show

## 2019-07-28 NOTE — ED Notes (Signed)
Patient verbalizes understanding of discharge instructions. Opportunity for questioning and answers were provided. Armband removed by staff, pt discharged from ED ambulatory.   

## 2019-08-01 ENCOUNTER — Other Ambulatory Visit: Payer: Self-pay

## 2019-08-01 ENCOUNTER — Encounter (HOSPITAL_COMMUNITY): Payer: Self-pay | Admitting: *Deleted

## 2019-08-01 ENCOUNTER — Emergency Department (HOSPITAL_COMMUNITY)
Admission: EM | Admit: 2019-08-01 | Discharge: 2019-08-01 | Disposition: A | Discharge status: Home / Self Care | Payer: Medicaid Other | Attending: Emergency Medicine | Admitting: Emergency Medicine

## 2019-08-01 DIAGNOSIS — Z79899 Other long term (current) drug therapy: Secondary | ICD-10-CM | POA: Diagnosis not present

## 2019-08-01 DIAGNOSIS — K9419 Other complications of enterostomy: Secondary | ICD-10-CM | POA: Insufficient documentation

## 2019-08-01 DIAGNOSIS — Z87891 Personal history of nicotine dependence: Secondary | ICD-10-CM | POA: Insufficient documentation

## 2019-08-01 NOTE — ED Notes (Signed)
A11 sent for ostomy supplies

## 2019-08-01 NOTE — Discharge Instructions (Signed)
Please follow up with your Gastroenterologist on the upcoming week.  If you experience any further prolapse or dysfunction you may return to the ED.

## 2019-08-01 NOTE — ED Notes (Signed)
Patient verbalizes understanding of discharge instructions. Opportunity for questioning and answers were provided. Armband removed by staff, pt discharged from ED. Pt ostomy supplies were not available. Used abd pad and chucks to help secure ostomy bag for pt to get home.

## 2019-08-01 NOTE — ED Triage Notes (Signed)
Pt says her stoma at her ileostomy site is protruding out, occurred at a time of having a bowel movement. Denies any blood in stool.

## 2019-08-01 NOTE — ED Provider Notes (Signed)
[facility] EMERGENCY DEPARTMENT Provider Note   CSN: [id number] Arrival date & time: 08/01/19  1543     History   Chief Complaint Chief Complaint  Patient presents with  . Abdominal Pain    HPI Lauren Ramirez is a 33 y.o. female.     33 y.o female with a PMH Chron's s/p ileostomy on July 13, 2019 at [facility] in [place] presents to the ED with complaints of herniation through her stoma. Patient reports she was at [facility] when she had a bowel movement and noted about 5 cm of her stoma where out. Patient reports no pain and states she has otherwise been able to take care of it on her own but reports this is new for her. She denies any fever, abdominal pain or other complaints.   The history is provided by the patient and medical records.    Past Medical History:  Diagnosis Date  . Abnormal Pap smear    ASCUS 2011  . Crohn's disease ([place])   . General counseling and advice for contraceptive management 09/06/2014  . History of preterm delivery, currently pregnant in first trimester 05/26/2018   32wk, 36wk, and 35 week twins.  17p  . Nexplanon in place 09/06/2014  . Vaginal Pap smear, abnormal     Patient Active Problem List   Diagnosis Date Noted  . IUFD at 80 weeks or more of gestation 09/07/2018  . Exacerbation of Crohn's disease ([place]) 07/28/2018  . History of preterm delivery, currently pregnant in second trimester 05/26/2018  . History of cesarean section, low transverse 05/26/2018  . History of Crohn's disease 05/26/2018    Past Surgical History:  Procedure Laterality Date  . CESAREAN SECTION  04/14/2012   Procedure: CESAREAN SECTION;  Surgeon: Osborne Oman, MD;  Location: [facility];  Service: Gynecology;  Laterality: N/A;  primary  . DILATION AND CURETTAGE OF UTERUS    . SMALL INTESTINE SURGERY       OB History    Gravida  5   Para  4   Term  0   Preterm  4   AB  1   Living  4     SAB  0   TAB  1   Ectopic  0   Multiple  1   Live Births  4            Home Medications    Prior to Admission medications   Medication Sig Start Date End Date Taking? Authorizing Provider  acetaminophen (TYLENOL) 500 MG tablet Take 500 mg by mouth every 6 (six) hours as needed for mild pain.   Yes [provider]  gabapentin (NEURONTIN) 300 MG capsule Take 300 mg by mouth 3 (three) times daily.    Yes [provider]  VITAMIN D PO Take 1 tablet by mouth See admin instructions. Take one tablet by mouth on Tuesday and Thursdays per patient   Yes [provider]    Family History Family History  Problem Relation Age of Onset  . Hypertension Mother   . Hypertension Maternal Grandmother   . Diabetes Maternal Grandmother   . Cancer Maternal Grandfather   . Anesthesia problems Neg Hx     Social History Social History   Tobacco Use  . Smoking status: Former Smoker    Packs/day: 0.00    Years: 7.00    Pack years: 0.00    Types: Cigarettes    Quit date: 04/15/2014    Years  since quitting: 5.2  . Smokeless tobacco: Never Used  . Tobacco comment: smokes 4-5 cig daily  Substance Use Topics  . Alcohol use: Not Currently    Comment: only occasionally; before pregnancy  . Drug use: No     Allergies   Patient has no known allergies.   Review of Systems Review of Systems  Constitutional: Negative for chills and fever.  HENT: Negative for ear pain and sore throat.   Eyes: Negative for pain and visual disturbance.  Respiratory: Negative for cough and shortness of breath.   Cardiovascular: Negative for chest pain and palpitations.  Gastrointestinal: Negative for abdominal pain and vomiting.  Genitourinary: Negative for dysuria and hematuria.  Musculoskeletal: Negative for arthralgias and back pain.  Skin: Positive for wound. Negative for color change and rash.  Neurological: Negative for seizures and syncope.  All other systems reviewed and are negative.    Physical Exam  Updated Vital Signs BP 114/78   Pulse 99   Temp 98.8 F (37.1 C) (Oral)   Resp 14   SpO2 99%   Physical Exam Vitals signs and nursing note reviewed.  Constitutional:      General: She is not in acute distress.    Appearance: She is well-developed.  HENT:     Head: Normocephalic and atraumatic.     Mouth/Throat:     Pharynx: No oropharyngeal exudate.  Eyes:     Pupils: Pupils are equal, round, and reactive to light.  Neck:     Musculoskeletal: Normal range of motion.  Cardiovascular:     Rate and Rhythm: Regular rhythm.     Heart sounds: Normal heart sounds.  Pulmonary:     Effort: Pulmonary effort is normal. No respiratory distress.     Breath sounds: Normal breath sounds.  Abdominal:     General: The ostomy site is clean. Bowel sounds are normal. There is no distension.     Palpations: Abdomen is soft.     Tenderness: There is no abdominal tenderness. There is no right CVA tenderness or left CVA tenderness.       Comments: 5 cm of her ostomy appear to have herniated out. No bleeding noted.   Musculoskeletal:        General: No tenderness or deformity.     Right lower leg: No edema.     Left lower leg: No edema.  Skin:    General: Skin is warm and dry.  Neurological:     Mental Status: She is alert and oriented to person, place, and time.      ED Treatments / Results  Labs (all labs ordered are listed, but only abnormal results are displayed) Labs Reviewed - No data to display  EKG None  Radiology No results found.  Procedures Procedures (including critical care time)  Medications Ordered in ED Medications - No data to display   Initial Impression / Assessment and Plan / ED Course  I have reviewed the triage vital signs and the nursing notes.  Pertinent labs & imaging results that were available during my care of the patient were reviewed by me and considered in my medical decision making (see chart for details).         Patient with a past  medical history of Crohn's status post ileostomy placement on July 13, 2019 at [place] presents to the ED with ileostomy herniation, reports she was at [facility] walking when she felt her ileostomy protrude out through her stoma.  She reports she recently  had the surgery and does not know that this would happen.  She reports no abdominal pain, no fevers, no irregular output from her stoma.  We have placed sugar on her stoma which is now retracted.  Patient has contacted her surgeon who has advised her that she will need to follow-up in office tomorrow.  Patient is otherwise with normal vital signs.  She is obtaining replacement and supply of her ileostomy bag.  Patient stable for discharge at this time without any further complaints.   Portions of this note were generated with Lobbyist. Dictation errors may occur despite best attempts at proofreading.  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

## 2019-08-10 ENCOUNTER — Other Ambulatory Visit: Payer: Self-pay

## 2019-08-10 ENCOUNTER — Encounter (HOSPITAL_COMMUNITY): Payer: Self-pay | Admitting: Emergency Medicine

## 2019-08-10 ENCOUNTER — Observation Stay (HOSPITAL_COMMUNITY)
Admission: EM | Admit: 2019-08-10 | Discharge: 2019-08-11 | Disposition: A | Discharge status: Home / Self Care | Payer: Medicaid Other | Attending: Internal Medicine | Admitting: Internal Medicine

## 2019-08-10 DIAGNOSIS — Z20828 Contact with and (suspected) exposure to other viral communicable diseases: Secondary | ICD-10-CM | POA: Diagnosis not present

## 2019-08-10 DIAGNOSIS — Z8719 Personal history of other diseases of the digestive system: Secondary | ICD-10-CM

## 2019-08-10 DIAGNOSIS — Z79899 Other long term (current) drug therapy: Secondary | ICD-10-CM | POA: Diagnosis not present

## 2019-08-10 DIAGNOSIS — Z87891 Personal history of nicotine dependence: Secondary | ICD-10-CM | POA: Insufficient documentation

## 2019-08-10 DIAGNOSIS — E878 Other disorders of electrolyte and fluid balance, not elsewhere classified: Secondary | ICD-10-CM | POA: Diagnosis not present

## 2019-08-10 DIAGNOSIS — K5090 Crohn's disease, unspecified, without complications: Secondary | ICD-10-CM | POA: Insufficient documentation

## 2019-08-10 DIAGNOSIS — E876 Hypokalemia: Secondary | ICD-10-CM | POA: Diagnosis not present

## 2019-08-10 DIAGNOSIS — K9419 Other complications of enterostomy: Secondary | ICD-10-CM | POA: Diagnosis present

## 2019-08-10 DIAGNOSIS — Y838 Other surgical procedures as the cause of abnormal reaction of the patient, or of later complication, without mention of misadventure at the time of the procedure: Secondary | ICD-10-CM | POA: Diagnosis not present

## 2019-08-10 DIAGNOSIS — Z0184 Encounter for antibody response examination: Secondary | ICD-10-CM | POA: Diagnosis not present

## 2019-08-10 LAB — CBC
HCT: 34.9 % — ABNORMAL LOW (ref 36.0–46.0)
Hemoglobin: 11.4 g/dL — ABNORMAL LOW (ref 12.0–15.0)
MCH: 31.8 pg (ref 26.0–34.0)
MCHC: 32.7 g/dL (ref 30.0–36.0)
MCV: 97.2 fL (ref 80.0–100.0)
Platelets: 464 10*3/uL — ABNORMAL HIGH (ref 150–400)
RBC: 3.59 MIL/uL — ABNORMAL LOW (ref 3.87–5.11)
RDW: 16.2 % — ABNORMAL HIGH (ref 11.5–15.5)
WBC: 9.3 10*3/uL (ref 4.0–10.5)
nRBC: 0 % (ref 0.0–0.2)

## 2019-08-10 LAB — BASIC METABOLIC PANEL
Anion gap: 13 (ref 5–15)
BUN: <5 mg/dL — ABNORMAL LOW (ref 6–20)
CO2: 20 mmol/L — ABNORMAL LOW (ref 22–32)
Calcium: 7.6 mg/dL — ABNORMAL LOW (ref 8.9–10.3)
Chloride: 104 mmol/L (ref 98–111)
Creatinine, Ser: 0.71 mg/dL (ref 0.44–1.00)
GFR calc Af Amer: >60 mL/min (ref >60)
GFR calc non Af Amer: >60 mL/min (ref >60)
Glucose, Bld: 100 mg/dL — ABNORMAL HIGH (ref 70–99)
Potassium: 3.3 mmol/L — ABNORMAL LOW (ref 3.5–5.1)
Sodium: 137 mmol/L (ref 135–145)

## 2019-08-10 LAB — HEPATIC FUNCTION PANEL
ALT: 35 U/L (ref 0–44)
AST: 34 U/L (ref 15–41)
Albumin: 3.2 g/dL — ABNORMAL LOW (ref 3.5–5.0)
Alkaline Phosphatase: 79 U/L (ref 38–126)
Bilirubin, Direct: 0.2 mg/dL (ref 0.0–0.2)
Indirect Bilirubin: 1 mg/dL — ABNORMAL HIGH (ref 0.3–0.9)
Total Bilirubin: 1.2 mg/dL (ref 0.3–1.2)
Total Protein: 7.4 g/dL (ref 6.5–8.1)

## 2019-08-10 LAB — SARS CORONAVIRUS 2 (TAT 6-24 HRS): SARS Coronavirus 2: NEGATIVE

## 2019-08-10 LAB — MAGNESIUM
Magnesium: 0.7 mg/dL — CL (ref 1.7–2.4)
Magnesium: 1.4 mg/dL — ABNORMAL LOW (ref 1.7–2.4)

## 2019-08-10 MED ORDER — ENOXAPARIN SODIUM 40 MG/0.4ML ~~LOC~~ SOLN
40.0000 mg | SUBCUTANEOUS | Status: DC
  Filled 2019-08-10: qty 0.4

## 2019-08-10 MED ORDER — CALCIUM GLUCONATE-NACL 1-0.675 GM/50ML-% IV SOLN
1.0000 g | Freq: Once | INTRAVENOUS | Status: AC
  Administered 2019-08-10: 08:00:00 1000 mg via INTRAVENOUS
  Filled 2019-08-10: qty 50

## 2019-08-10 MED ORDER — ACETAMINOPHEN 325 MG PO TABS
650.0000 mg | ORAL_TABLET | Freq: Four times a day (QID) | ORAL | Status: DC | PRN
  Administered 2019-08-11: 650 mg via ORAL
  Filled 2019-08-10: qty 2

## 2019-08-10 MED ORDER — CALCIUM CARBONATE ANTACID 500 MG PO CHEW
1.0000 | CHEWABLE_TABLET | Freq: Once | ORAL | Status: AC
  Administered 2019-08-10: 200 mg via ORAL
  Filled 2019-08-10: qty 1

## 2019-08-10 MED ORDER — ONDANSETRON HCL 4 MG PO TABS: 4.0000 mg | ORAL_TABLET | Freq: Four times a day (QID) | ORAL | Status: DC | PRN

## 2019-08-10 MED ORDER — MAGNESIUM SULFATE 2 GM/50ML IV SOLN
2.0000 g | INTRAVENOUS | Status: AC
  Administered 2019-08-10: 2 g via INTRAVENOUS
  Filled 2019-08-10: qty 50

## 2019-08-10 MED ORDER — GABAPENTIN 300 MG PO CAPS
300.0000 mg | ORAL_CAPSULE | Freq: Three times a day (TID) | ORAL | Status: DC
  Administered 2019-08-10 – 2019-08-11 (×5): 300 mg via ORAL
  Filled 2019-08-10 (×5): qty 1

## 2019-08-10 MED ORDER — POTASSIUM CHLORIDE CRYS ER 20 MEQ PO TBCR
40.0000 meq | EXTENDED_RELEASE_TABLET | Freq: Once | ORAL | Status: AC
  Administered 2019-08-10: 40 meq via ORAL
  Filled 2019-08-10: qty 2

## 2019-08-10 MED ORDER — THIAMINE HCL 100 MG/ML IJ SOLN
Freq: Once | INTRAVENOUS | Status: AC
  Administered 2019-08-10: 12:00:00 via INTRAVENOUS
  Filled 2019-08-10: qty 1000

## 2019-08-10 MED ORDER — LORAZEPAM 2 MG/ML IJ SOLN
1.0000 mg | Freq: Once | INTRAMUSCULAR | Status: AC
  Administered 2019-08-10: 05:00:00 1 mg via INTRAVENOUS
  Filled 2019-08-10: qty 1

## 2019-08-10 MED ORDER — SODIUM CHLORIDE 0.9 % IV SOLN: 1.0000 g | Freq: Once | INTRAVENOUS | Status: DC

## 2019-08-10 MED ORDER — ACETAMINOPHEN 650 MG RE SUPP: 650.0000 mg | Freq: Four times a day (QID) | RECTAL | Status: DC | PRN

## 2019-08-10 MED ORDER — ONDANSETRON HCL 4 MG/2ML IJ SOLN: 4.0000 mg | Freq: Four times a day (QID) | INTRAMUSCULAR | Status: DC | PRN

## 2019-08-10 NOTE — ED Notes (Signed)
Dr. Lorin Mercy in for admit assessment.

## 2019-08-10 NOTE — ED Notes (Signed)
Lunch Tray Ordered @ 0707.

## 2019-08-10 NOTE — ED Notes (Signed)
Pt c/o some irritation at IV insertion site with Magnesium infusion.  Line remains patent without redness or edema.  NS 500 hung to dilute for remainder of medication.  Pt immediately feels relief and is able to return to sleep.

## 2019-08-10 NOTE — ED Triage Notes (Signed)
C/O of cramping in hands, feet and legs starting tonight. Pt also has temporary ostomy, per pt when she panicked in the lobby her bowel protruded into ostomy pouch.

## 2019-08-10 NOTE — Consult Note (Addendum)
[facility] Nurse ostomy consult note Stoma type/location: RLQ prolapsed ileostomy.  Will implement convex pouch with ostomy belt.  Stomal assessment/size: unable to assess.   Peristomal assessment: intact Treatment options for stomal/peristomal skin: barrier ring, 1 piece pouch and ostomy belt.  Patient is independent in care.  She is struggling with supplies.  Output liquid green stool.  Stoma has been reduced by ED physician.   Ostomy pouching: 1pc.convex with barrier ring and belt provided.  Patient to apply.  Pouch (LAWSON # [phone]) Barrier ring (LAWSON # [phone]) Belt (LAWSON # [phone]) Will order 3 pouches and rings  Education provided: Supplies to be ordered.  Enrolled patient in [place] program: No  Has supply set up already.  Will not follow at this time.  Please re-consult if needed.  Domenic Moras MSN, RN, FNP-BC [facility]ON Wound, Ostomy, Continence Nurse Pager [phone]

## 2019-08-10 NOTE — H&P (Signed)
History and Physical    Lauren Ramirez DQQ:[id number] DOB: 1986-03-31 DOA: 08/10/2019  PCP: System, Pcp Not In Consultants:  [facility] - GI; Kennith Gain in [place]; Rosana Hoes in [place] - surgery Patient coming from:  Home - lives with mother, children; NOK: Mother, [phone]  Chief Complaint: Cramping  HPI: Lauren Ramirez is a 33 y.o. female with medical history significant of Crohn's presenting with cramping.  She reports numbness and tingling in her body.  She noticed it particularly in her hands and legs.  Symptoms started last night.  She was feeling fine prior to last night.  She has been eating and drinking normal.  She has had prior electrolyte problems with Crohn's flares.  Her last flare was in July and she had an ostomy placed.  She wasn't satisfied with her care here and so she went to [facility] for her care and had surgery there.  The ostomy is thought to be temporary.    ED Course:  Carryover, per Dr. Hal Hope:  33 year old female with Crohn's disease presents with carpopedal spasms and symptoms consistent with hypocalcemia. Was given IV calcium EKG does not show any prolonged QTC. Patient also has some herniation around colostomy bag which ER physician is about to reduce. COVID-19 test is pending.  Review of Systems: As per HPI; otherwise review of systems reviewed and negative.   Ambulatory Status:  Ambulates without assistance  Past Medical History:  Diagnosis Date  . Abnormal Pap smear    ASCUS 2011  . Crohn's disease ([place])    ostomy 06/2019  . General counseling and advice for contraceptive management 09/06/2014  . History of preterm delivery, currently pregnant in first trimester 05/26/2018   32wk, 36wk, and 35 week twins.  17p  . Nexplanon in place 09/06/2014    Past Surgical History:  Procedure Laterality Date  . CESAREAN SECTION  04/14/2012   Procedure: CESAREAN SECTION;  Surgeon: Osborne Oman, MD;  Location: [facility];  Service: Gynecology;   Laterality: N/A;  primary  . DILATION AND CURETTAGE OF UTERUS    . SMALL INTESTINE SURGERY      Social History   Socioeconomic History  . Marital status: Single    Spouse name: Not on file  . Number of children: Not on file  . Years of education: Not on file  . Highest education level: Some college, no degree  Occupational History  . Occupation: Therapist, art  Social Needs  . Financial resource strain: Somewhat hard  . Food insecurity    Worry: Never true    Inability: Never true  . Transportation needs    Medical: No    Non-medical: No  Tobacco Use  . Smoking status: Former Smoker    Packs/day: 0.00    Years: 7.00    Pack years: 0.00    Types: Cigarettes    Quit date: 04/15/2014    Years since quitting: 5.3  . Smokeless tobacco: Never Used  . Tobacco comment: smokes 4-5 cig daily  Substance and Sexual Activity  . Alcohol use: Not Currently    Comment: only occasionally; before pregnancy  . Drug use: No  . Sexual activity: Yes    Birth control/protection: None  Lifestyle  . Physical activity    Days per week: 0 days    Minutes per session: 0 min  . Stress: Only a little  Relationships  . Social connections    Talks on phone: Once a week    Gets together: Never  Attends religious service: 1 to 4 times per year    Active member of club or organization: No    Attends meetings of clubs or organizations: Never    Relationship status: Never married  . Intimate partner violence    Fear of current or ex partner: No    Emotionally abused: No    Physically abused: No    Forced sexual activity: No  Other Topics Concern  . Not on file  Social History Narrative  . Not on file    No Known Allergies  Family History  Problem Relation Age of Onset  . Hypertension Mother   . Crohn's disease Mother   . Hypertension Maternal Grandmother   . Diabetes Maternal Grandmother   . Cancer Maternal Grandfather   . Crohn's disease Brother   . Anesthesia problems Neg Hx      Prior to Admission medications   Medication Sig Start Date End Date Taking? Authorizing Provider  acetaminophen (TYLENOL) 500 MG tablet Take 500 mg by mouth every 6 (six) hours as needed for mild pain.   Yes [provider]  gabapentin (NEURONTIN) 300 MG capsule Take 300 mg by mouth 3 (three) times daily.    Yes [provider]  VITAMIN D PO Take 1 tablet by mouth See admin instructions. Take one tablet by mouth on Tuesday and Thursdays per patient   Yes [provider]    Physical Exam: Vitals:   08/10/19 0830 08/10/19 0845 08/10/19 0915 08/10/19 0930  BP: 119/86 117/72 104/75 118/64  Pulse: (!) 107 100 82 77  Resp: 16 20 18 16   Temp:      SpO2: 100% 100% 100% 99%  Weight:      Height:         . General:  Appears calm and comfortable and is NAD . Eyes:  PERRL, EOMI, normal lids, iris . ENT:  grossly normal hearing, lips & tongue, mmm; negative Chvostek sign . Neck:  no LAD, masses or thyromegaly . Cardiovascular:  RRR, no m/r/g. No LE edema.  Marland Kitchen Respiratory:   CTA bilaterally with no wheezes/rales/rhonchi.  Normal respiratory effort. . Abdomen:  soft, NT, ND, NABS; ostomy in place in R mid-to-lower abdomen . Skin:  no rash or induration seen on limited exam . Musculoskeletal:  grossly normal tone BUE/BLE, good ROM, no bony abnormality . Psychiatric:  grossly normal mood and affect, speech fluent and appropriate, AOx3 (patient had her COVID-19 swab as I walked in the room and was quite histrionic but settled after she recovered) . Neurologic:  CN 2-12 grossly intact, moves all extremities in coordinated fashion, sensation intact    Radiological Exams on Admission: No results found.  EKG: Independently reviewed. Sinus tachycardia with rate 121; no evidence of acute ischemia; NSCSLT   Labs on Admission: I have personally reviewed the available labs and imaging studies at the time of the admission.  Pertinent labs:   K+ 3.3 CO2 20 Glucose 100  Calcium 7.6; corrects to 8.2 with albumin Mag++ 0.7 Albumin 3.2 WBC 9.3 Hgb 11.4   Assessment/Plan Active Problems:   History of Crohn's disease   Hypocalcemia   Hypomagnesemia   Hypokalemia   Electrolyte abnormalities -Patient with recent ostomy due to Crohn's - done in [place] and records are not available at this time but have been requested -She presented with diffuse cramping and was found to have a number of electrolyte abnormalities -Mild hypocalcemia, negative Chvostek sign; ionized calcium pending.  She was given Tums and  calcium gluconate in the ER.  Will recheck ionized calcium at 5 pm and 5 am. -Mild hypokalemia, will replete with 40 mEq PO KCl and recheck BMP in AM -More significant hypomagnesemia, Mag++ 0.7.  She was given 2 grams Mag replacement.  Will recheck Mag++ at 5 pm and 5 am. -Will give 1L banana bag at 100 cc/hr. -Will observe overnight on telemetry. -Anticipate d/c to home tomorrow once electrolytes have normalized. -Nutrition consult requested.  Crohn's with recent ostomy -Patient had surgery recently in [place] -She had stomal osmotic reduction in the ER -Ostomy care consult placed -Nutrition consult, as above   Note: This patient has been tested and is pending for the novel coronavirus COVID-19.  DVT prophylaxis:  Lovenox  Code Status:  Full  Family Communication: None present  Disposition Plan:  Home once clinically improved Consults called: None  Admission status: It is my clinical opinion that referral for OBSERVATION is reasonable and necessary in this patient based on the above information provided. The aforementioned taken together are felt to place the patient at high risk for further clinical deterioration. However it is anticipated that the patient may be medically stable for discharge from the hospital within 24 to 48 hours.    Karmen Bongo MD [facility]   How to contact the [facility] Attending or Consulting provider [place] or covering provider during after hours [place], for this patient?  1. Check the care team in [facility] and look for a) attending/consulting TR[facility] provider listed and b) the [facility] team listed 2. Log into [email or web address] and use [place]'s universal password to access. If you do not have the password, please contact the hospital operator. 3. Locate the [facility] provider you are looking for under [facility] and page to a number that you can be directly reached. 4. If you still have difficulty reaching the provider, please page the [facility] (Director on Call) for the Hospitalists listed on amion for assistance.   08/10/2019, 10:31 AM

## 2019-08-10 NOTE — ED Provider Notes (Signed)
[facility] EMERGENCY DEPARTMENT Provider Note   CSN: [id number] Arrival date & time: 08/10/19  0355     History   Chief Complaint Chief Complaint  Patient presents with  . Spasms  . Ostomy Problem    HPI Lauren Ramirez is a 33 y.o. female.     Patient presents to the ED with a chief complaint of hand tingling, spasms that started today.  She states she has had waxing and waning symptoms over the past day.  Reports severe cramping at times.  Complains that it is worse when her BP is taken in triage.  She also complains of herniation at her colostomy which is fairly new and created 2/2 crohn's.  Had this problem recently and had it reduced in the ED with sugar.  The history is provided by the patient. No language interpreter was used.    Past Medical History:  Diagnosis Date  . Abnormal Pap smear    ASCUS 2011  . Crohn's disease ([place])   . General counseling and advice for contraceptive management 09/06/2014  . History of preterm delivery, currently pregnant in first trimester 05/26/2018   32wk, 36wk, and 35 week twins.  17p  . Nexplanon in place 09/06/2014  . Vaginal Pap smear, abnormal     Patient Active Problem List   Diagnosis Date Noted  . IUFD at 53 weeks or more of gestation 09/07/2018  . Exacerbation of Crohn's disease ([place]) 07/28/2018  . History of preterm delivery, currently pregnant in second trimester 05/26/2018  . History of cesarean section, low transverse 05/26/2018  . History of Crohn's disease 05/26/2018    Past Surgical History:  Procedure Laterality Date  . CESAREAN SECTION  04/14/2012   Procedure: CESAREAN SECTION;  Surgeon: Osborne Oman, MD;  Location: [facility];  Service: Gynecology;  Laterality: N/A;  primary  . DILATION AND CURETTAGE OF UTERUS    . SMALL INTESTINE SURGERY       OB History    Gravida  5   Para  4   Term  0   Preterm  4   AB  1   Living  4     SAB  0   TAB  1   Ectopic  0   Multiple   1   Live Births  4            Home Medications    Prior to Admission medications   Medication Sig Start Date End Date Taking? Authorizing Provider  acetaminophen (TYLENOL) 500 MG tablet Take 500 mg by mouth every 6 (six) hours as needed for mild pain.   Yes [provider]  gabapentin (NEURONTIN) 300 MG capsule Take 300 mg by mouth 3 (three) times daily.    Yes [provider]  VITAMIN D PO Take 1 tablet by mouth See admin instructions. Take one tablet by mouth on Tuesday and Thursdays per patient   Yes [provider]    Family History Family History  Problem Relation Age of Onset  . Hypertension Mother   . Hypertension Maternal Grandmother   . Diabetes Maternal Grandmother   . Cancer Maternal Grandfather   . Anesthesia problems Neg Hx     Social History Social History   Tobacco Use  . Smoking status: Former Smoker    Packs/day: 0.00    Years: 7.00    Pack years: 0.00    Types: Cigarettes    Quit date: 04/15/2014    Years since  quitting: 5.3  . Smokeless tobacco: Never Used  . Tobacco comment: smokes 4-5 cig daily  Substance Use Topics  . Alcohol use: Not Currently    Comment: only occasionally; before pregnancy  . Drug use: No     Allergies   Patient has no known allergies.   Review of Systems Review of Systems  All other systems reviewed and are negative.    Physical Exam Updated Vital Signs BP (!) 127/112 (BP Location: Left Arm)   Pulse (!) 115   Temp 98.7 F (37.1 C)   Resp 18   Ht 5' 6"  (1.676 m)   Wt 75.8 kg   SpO2 100%   BMI 26.95 kg/m   Physical Exam Vitals signs and nursing note reviewed.  Constitutional:      General: She is not in acute distress.    Appearance: She is well-developed.  HENT:     Head: Normocephalic and atraumatic.  Eyes:     Conjunctiva/sclera: Conjunctivae normal.  Neck:     Musculoskeletal: Neck supple.  Cardiovascular:     Rate and Rhythm: Normal rate and regular rhythm.      Heart sounds: No murmur.  Pulmonary:     Effort: Pulmonary effort is normal. No respiratory distress.     Breath sounds: Normal breath sounds.  Abdominal:     Palpations: Abdomen is soft.     Tenderness: There is no abdominal tenderness.  Musculoskeletal: Normal range of motion.     Comments: Positive Trousseau's   Skin:    General: Skin is warm and dry.  Neurological:     Mental Status: She is alert.  Psychiatric:        Mood and Affect: Mood normal.        Behavior: Behavior normal.      ED Treatments / Results  Labs (all labs ordered are listed, but only abnormal results are displayed) Labs Reviewed  CBC - Abnormal; Notable for the following components:      Result Value   RBC 3.59 (*)    Hemoglobin 11.4 (*)    HCT 34.9 (*)    RDW 16.2 (*)    Platelets 464 (*)    All other components within normal limits  BASIC METABOLIC PANEL - Abnormal; Notable for the following components:   Potassium 3.3 (*)    CO2 20 (*)    Glucose, Bld 100 (*)    BUN <5 (*)    Calcium 7.6 (*)    All other components within normal limits  HEPATIC FUNCTION PANEL - Abnormal; Notable for the following components:   Albumin 3.2 (*)    Indirect Bilirubin 1.0 (*)    All other components within normal limits  SARS CORONAVIRUS 2 (TAT 6-24 HRS)  MAGNESIUM    EKG None  Radiology No results found.  Procedures Hernia reduction  Date/Time: 08/10/2019 7:01 AM Performed by: Montine Circle, PA-C Authorized by: Montine Circle, PA-C  Consent: Verbal consent obtained. Risks and benefits: risks, benefits and alternatives were discussed Consent given by: patient Patient understanding: patient states understanding of the procedure being performed Patient consent: the patient's understanding of the procedure matches consent given Procedure consent: procedure consent matches procedure scheduled Relevant documents: relevant documents present and verified Test results: test results available and  properly labeled Site marked: the operative site was marked Imaging studies: imaging studies available Required items: required blood products, implants, devices, and special equipment available Time out: Immediately prior to procedure a "time out" was called to verify  the correct patient, procedure, equipment, support staff and site/side marked as required. Preparation: Patient was prepped and draped in the usual sterile fashion. Local anesthesia used: no  Anesthesia: Local anesthesia used: no  Sedation: Patient sedated: no  Patient tolerance: patient tolerated the procedure well with no immediate complications Comments: Ileostomy prolapse reduced with sugar    (including critical care time) CRITICAL CARE Performed by: Montine Circle  Symptomatic hypocalcemia, mag 0.7, IV calcium glucanate Total critical care time: 30 minutes  Critical care time was exclusive of separately billable procedures and treating other patients.  Critical care was necessary to treat or prevent imminent or life-threatening deterioration.  Critical care was time spent personally by me on the following activities: development of treatment plan with patient and/or surrogate as well as nursing, discussions with consultants, evaluation of patient's response to treatment, examination of patient, obtaining history from patient or surrogate, ordering and performing treatments and interventions, ordering and review of laboratory studies, ordering and review of radiographic studies, pulse oximetry and re-evaluation of patient's condition.  Medications Ordered in ED Medications  calcium gluconate 1 g/ 50 mL sodium chloride IVPB (has no administration in time range)  LORazepam (ATIVAN) injection 1 mg (1 mg Intravenous Given 08/10/19 0500)     Initial Impression / Assessment and Plan / ED Course  I have reviewed the triage vital signs and the nursing notes.  Pertinent labs & imaging results that were available  during my care of the patient were reviewed by me and considered in my medical decision making (see chart for details).        Patient with carpal spasms and tingling.  Calcium is 7.6.  Seen by and discussed with Dr. Kathrynn Humble, who recommends IV calcium and admission for monitoring.  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  Case discussed with Dr. Hal Hope, who will admit.  Final Clinical Impressions(s) / ED Diagnoses   Final diagnoses:  Hypocalcemia  Ileostomy prolapse [facility])    ED Discharge Orders    None       Montine Circle, PA-C 08/10/19 [place], [place], MD 08/10/19 [phone]

## 2019-08-10 NOTE — Progress Notes (Signed)
  Pt orientation to unit, room and routine. Information packet given to patient/family and safety video watched.  Admission INP armband ID verified with patient/family, and in place. SR up x 2, fall risk assessment complete with Patient and family verbalizing understanding of risks associated with falls. Pt verbalizes an understanding of how to use the call bell and to call for help before getting out of bed.  Skin, clean-dry- intact without evidence of bruising, or skin tears.   No evidence of skin break down noted on exam.  Will cont to monitor and assist as needed.  Jaci Desanto Shelda Pal, RN 08/10/2019 4:10 PM

## 2019-08-11 DIAGNOSIS — E876 Hypokalemia: Secondary | ICD-10-CM | POA: Diagnosis not present

## 2019-08-11 DIAGNOSIS — Z8719 Personal history of other diseases of the digestive system: Secondary | ICD-10-CM | POA: Diagnosis not present

## 2019-08-11 LAB — MAGNESIUM: Magnesium: 1.4 mg/dL — ABNORMAL LOW (ref 1.7–2.4)

## 2019-08-11 LAB — CALCIUM, IONIZED
Calcium, Ionized, Serum: 4.2 mg/dL — ABNORMAL LOW (ref 4.5–5.6)
Calcium, Ionized, Serum: 4.4 mg/dL — ABNORMAL LOW (ref 4.5–5.6)

## 2019-08-11 LAB — CBC
HCT: 29.6 % — ABNORMAL LOW (ref 36.0–46.0)
Hemoglobin: 9.7 g/dL — ABNORMAL LOW (ref 12.0–15.0)
MCH: 32.2 pg (ref 26.0–34.0)
MCHC: 32.8 g/dL (ref 30.0–36.0)
MCV: 98.3 fL (ref 80.0–100.0)
Platelets: 378 10*3/uL (ref 150–400)
RBC: 3.01 MIL/uL — ABNORMAL LOW (ref 3.87–5.11)
RDW: 16.7 % — ABNORMAL HIGH (ref 11.5–15.5)
WBC: 6.4 10*3/uL (ref 4.0–10.5)
nRBC: 0 % (ref 0.0–0.2)

## 2019-08-11 LAB — PHOSPHORUS: Phosphorus: 3.7 mg/dL (ref 2.5–4.6)

## 2019-08-11 LAB — HIV ANTIBODY (ROUTINE TESTING W REFLEX): HIV Screen 4th Generation wRfx: NONREACTIVE

## 2019-08-11 LAB — BASIC METABOLIC PANEL
Anion gap: 7 (ref 5–15)
BUN: <5 mg/dL — ABNORMAL LOW (ref 6–20)
CO2: 22 mmol/L (ref 22–32)
Calcium: 7.4 mg/dL — ABNORMAL LOW (ref 8.9–10.3)
Chloride: 107 mmol/L (ref 98–111)
Creatinine, Ser: 0.6 mg/dL (ref 0.44–1.00)
GFR calc Af Amer: >60 mL/min (ref >60)
GFR calc non Af Amer: >60 mL/min (ref >60)
Glucose, Bld: 92 mg/dL (ref 70–99)
Potassium: 3.2 mmol/L — ABNORMAL LOW (ref 3.5–5.1)
Sodium: 136 mmol/L (ref 135–145)

## 2019-08-11 MED ORDER — CALCIUM GLUCONATE-NACL 1-0.675 GM/50ML-% IV SOLN
1.0000 g | Freq: Once | INTRAVENOUS | Status: AC
  Administered 2019-08-11: 1000 mg via INTRAVENOUS
  Filled 2019-08-11: qty 50

## 2019-08-11 MED ORDER — POTASSIUM CHLORIDE CRYS ER 20 MEQ PO TBCR
40.0000 meq | EXTENDED_RELEASE_TABLET | ORAL | Status: AC
  Administered 2019-08-11 (×2): 40 meq via ORAL
  Filled 2019-08-11 (×2): qty 2

## 2019-08-11 MED ORDER — MAGNESIUM SULFATE 2 GM/50ML IV SOLN
2.0000 g | Freq: Once | INTRAVENOUS | Status: AC
  Administered 2019-08-11: 11:00:00 2 g via INTRAVENOUS
  Filled 2019-08-11: qty 50

## 2019-08-11 MED ORDER — POTASSIUM CHLORIDE ER 10 MEQ PO TBCR: 10.0000 meq | EXTENDED_RELEASE_TABLET | Freq: Every day | ORAL | 0 refills | Status: DC

## 2019-08-11 MED ORDER — POTASSIUM CHLORIDE 10 MEQ/100ML IV SOLN: 10.0000 meq | INTRAVENOUS | Status: DC

## 2019-08-11 MED ORDER — CALCIUM CARBONATE ANTACID 500 MG PO CHEW: 1.0000 | CHEWABLE_TABLET | Freq: Three times a day (TID) | ORAL | 0 refills | Status: AC

## 2019-08-11 MED FILL — CALCIUM ANTACID 500 MG CHW: 500 | 50 days supply | Qty: 150 | Fill #0

## 2019-08-11 MED FILL — POTASSIUM CHL ER M10 TABLET: 10 | 20 days supply | Qty: 20 | Fill #0

## 2019-08-11 NOTE — Progress Notes (Signed)
This RN took over care of this patient at this time

## 2019-08-11 NOTE — Discharge Instructions (Signed)
Follow with Primary MD  in 7 days   Get CBC, CMP, Mg 2 view Chest X ray checked  by Primary MD next visit.    Activity: As tolerated with Full fall precautions use walker/cane & assistance as needed   Disposition Home    Diet: Regular diet   On your next visit with your primary care physician please Get Medicines reviewed and adjusted.   Please request your Prim.MD to go over all Hospital Tests and Procedure/Radiological results at the follow up, please get all Hospital records sent to your Prim MD by signing hospital release before you go home.   If you experience worsening of your admission symptoms, develop shortness of breath, life threatening emergency, suicidal or homicidal thoughts you must seek medical attention immediately by calling 911 or calling your MD immediately  if symptoms less severe.  You Must read complete instructions/literature along with all the possible adverse reactions/side effects for all the Medicines you take and that have been prescribed to you. Take any new Medicines after you have completely understood and accpet all the possible adverse reactions/side effects.   Do not drive, operating heavy machinery, perform activities at heights, swimming or participation in water activities or provide baby sitting services if your were admitted for syncope or siezures until you have seen by Primary MD or a Neurologist and advised to do so again.  Do not drive when taking Pain medications.    Do not take more than prescribed Pain, Sleep and Anxiety Medications  Special Instructions: If you have smoked or chewed Tobacco  in the last 2 yrs please stop smoking, stop any regular Alcohol  and or any Recreational drug use.  Wear Seat belts while driving.   Please note  You were cared for by a hospitalist during your hospital stay. If you have any questions about your discharge medications or the care you received while you were in the hospital after you are  discharged, you can call the unit and asked to speak with the hospitalist on call if the hospitalist that took care of you is not available. Once you are discharged, your primary care physician will handle any further medical issues. Please note that NO REFILLS for any discharge medications will be authorized once you are discharged, as it is imperative that you return to your primary care physician (or establish a relationship with a primary care physician if you do not have one) for your aftercare needs so that they can reassess your need for medications and monitor your lab values.

## 2019-08-11 NOTE — Progress Notes (Addendum)
Nutrition Brief Note  RD consulted for assessment of nutritional needs and status.   Wt Readings from Last 15 Encounters:  08/11/19 76.3 kg  01/21/19 86.6 kg  09/06/18 87.1 kg  09/02/18 85.7 kg  08/21/18 86.2 kg  08/12/18 88 kg  08/04/18 88 kg  07/28/18 84.6 kg  07/23/18 84.8 kg  07/08/18 98.9 kg  05/26/18 98.4 kg  11/15/17 98 kg  02/20/17 101.8 kg  01/20/17 102.1 kg  01/26/15 104.3 kg   Lauren Ramirez is a 33 y.o. female with medical history significant of Crohn's presenting with cramping.  She reports numbness and tingling in her body.  She noticed it particularly in her hands and legs.  Symptoms started last night.  She was feeling fine prior to last night.  She has been eating and drinking normal.  She has had prior electrolyte problems with Crohn's flares.  Her last flare was in July and she had an ostomy placed.  She wasn't satisfied with her care here and so she went to [facility] for her care and had surgery there.  The ostomy is thought to be temporary.  Pt admitted with electrolyte abnormalities.   Spoke with pt at bedside, who was pleasant and in good spirits. She reports great appetite over the past month since undergoing ileostomy ("I can't stop eating"). Reviewed breakfast tray- pt consumed 50% of omelette, multiple juices, graham crackers, hashbrowns, and a bagel. Pt reports she is consuming 3 meals per day and also consuming many fluids, including gatorade, water, fruit juice, and soft drinks.   Pt reports her weight has fluctuated over the years due to pregnancies. Pt estimated UBW is around 180# and has gained back about 10# since undergoing surgery last month. Pt shares that she lost about 20# over the past 6 months prior to surgery due to inability to keep down foods and liquids approximately 1-2 months prior to surgery.   Pt has had crohn's disease since 2008. She is very well versed in management and feels comfortable with ileostomy care, although she admits  it's been "an adjustment" for her. Pt has a very supportive family, including a mom and older brother, who also have crohn's disease. Encouraged continued fluid intake to prevent dehydration.   Nutrition-Focused physical exam completed. Findings are no fat depletion, no muscle depletion, and no edema.   Pt hopeful for discharge today. Per discussion with RN, pt with hearty appetite and will require another IV calcium infusion today.   Pt denies any further nutritional needs, but expressed appreciation for visit.   Albumin has a half-life of 21 days and is strongly affected by stress response and inflammatory process, therefore, do not expect to see an improvement in this lab value during acute hospitalization. When a patient presents with low albumin, it is likely skewed due to the acute inflammatory response.  Unless it is suspected that patient had poor PO intake or malnutrition prior to admission, then RD should not be consulted solely for low albumin. Note that low albumin is no longer used to diagnose malnutrition; [facility] uses the new malnutrition guidelines published by the American Society for Parenteral and Enteral Nutrition (A.S.P.E.N.) and the Academy of Nutrition and Dietetics (AND).    Labs reviewed.   Body mass index is 27.15 kg/m. Patient meets criteria for overweight based on current BMI.   Current diet order is regular, patient is consuming approximately 75-100% of meals at this time. Labs and medications reviewed.   No nutrition interventions warranted at  this time. If nutrition issues arise, please consult RD.   Delonta Yohannes A. Jimmye Norman, RD, LDN, [place] Registered Dietitian II Certified Diabetes Care and Education Specialist Pager: [phone] After hours Pager: [phone]

## 2019-08-11 NOTE — Plan of Care (Signed)

## 2019-08-11 NOTE — Consult Note (Signed)
[facility] Nurse ostomy follow up Stoma type/location: RLQ ileostomy patient independent in care. States she manages fine until her supplies become an issue. She has some pouches that don't provide acceptable wear time.  Her stool fluctuates between liquid and more soft.  History of Crohns.  Today, her stoma is not prolapsed.  Stomal assessment/size: 1 1'2" pink patent and producing liquid green stool today.  Peristomal assessment: intact  Prefers convex pouch and barrier ring Treatment options for stomal/peristomal skin: convex with ostomy belt.  Output liquid green stool  Ostomy pouching: 1pc. convex Education provided: Discussed wearing the ostomy belt for added security.  The convex pouch does not injure her stoma when it prolapses and she prefers to continue to wear this system.  I will order her supplies for discharge. Enrolled patient in [facility] Discharge program: Yes previous admission.  Pouch (LAWSON # [phone]) Barrier ring (LAWSON # [phone]) Belt (LAWSON # [phone]) Will order 5 pouches and rings Will not follow at this time.  Please re-consult if needed.  Domenic Moras MSN, RN, FNP-BC CWON Wound, Ostomy, Continence Nurse Pager [phone]

## 2019-08-11 NOTE — Discharge Summary (Signed)
Lauren Ramirez, is a 33 y.o. female  DOB 07-18-1986  MRN [id number].  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 Abnormal Pap smear    ASCUS 2011  . Crohn's disease ([place])    ostomy 06/2019  . General counseling and advice for contraceptive management 09/06/2014  . History of preterm delivery, currently pregnant in first trimester 05/26/2018   32wk, 36wk, and 35 week twins.  17p  . Nexplanon in place 09/06/2014    Past Surgical History:  Procedure Laterality Date  . CESAREAN SECTION  04/14/2012   Procedure: CESAREAN SECTION;  Surgeon: Osborne Oman, MD;  Location: [facility];  Service: Gynecology;  Laterality: N/A;  primary  . DILATION AND CURETTAGE OF UTERUS    . HEMICOLECTOMY W/ OSTOMY  06/2019   done in [place]  . SMALL INTESTINE SURGERY         History of present illness and  Hospital Course:     Kindly see H&P for history of present illness and admission details, please review complete Labs, Consult reports and Test reports for all details in brief  HPI  from the history and physical done on the day of admission 08/10/2019   HPI: Lauren Ramirez is a 33 y.o. female with medical history significant of Crohn's presenting with cramping.  She reports numbness and tingling in her body.  She noticed it particularly in her hands and legs.  Symptoms started last  night.  She was feeling fine prior to last night.  She has been eating and drinking normal.  She has had prior electrolyte problems with Crohn's flares.  Her last flare was in July and she had an ostomy placed.  She wasn't satisfied with her care here and so she went to [facility] for her care and had surgery there.  The ostomy is thought to be temporary.    ED Course:  [place], per Dr. Hal Hope:  33 year old female with Crohn's disease presents with carpopedal spasms and symptoms consistent with hypocalcemia. Was given IV calcium EKG does not show any prolonged QTC. Patient also has some herniation around colostomy bag which ER physician is about to reduce. COVID-19 test is pending.   Hospital Course   Electrolyte abnormalities including hypocalcemia, hypokalemia and hypomagnesemia -Patient with recent ostomy due to Crohn's disease, done in [place], 07/13/2019, report all her GI care, and general care at [facility]. -She had hypocalcemia, hypomagnesemia and hypokalemia on presentation, she received IV fluids, IV banana bag, she received  IV calcium, IV magnesium, was unable to tolerate IV potassium, so it was transitioned to oral, reports she was drinking Gatorade until recently, so she was encouraged to drink Gatorade again, she will be discharged on potassium supplement, calcium supplement, and magnesium oxide will give her diarrhea, she opted not to take any magnesium supplement at home.   Crohn's with recent ostomy -Patient had surgery recently in [place] -She had stomal osmotic reduction in the ER -Ostomy care consult placed    Discharge Condition:  stable   Discharge Instructions  and  Discharge Medications     Discharge Instructions    Discharge instructions   Complete by: As directed    Follow with Primary MD  in 7 days   Get CBC, CMP, Mg checked  by Primary MD next visit.    Activity: As tolerated with Full fall precautions use walker/cane & assistance as  needed   Disposition Home    Diet: Regular diet, increase your Gatorade intake   On your next visit with your primary care physician please Get Medicines reviewed and adjusted.   Please request your Prim.MD to go over all Hospital Tests and Procedure/Radiological results at the follow up, please get all Hospital records sent to your Prim MD by signing hospital release before you go home.   If you experience worsening of your admission symptoms, develop shortness of breath, life threatening emergency, suicidal or homicidal thoughts you must seek medical attention immediately by calling 911 or calling your MD immediately  if symptoms less severe.  You Must read complete instructions/literature along with all the possible adverse reactions/side effects for all the Medicines you take and that have been prescribed to you. Take any new Medicines after you have completely understood and accpet all the possible adverse reactions/side effects.   Do not drive, operating heavy machinery, perform activities at heights, swimming or participation in water activities or provide baby sitting services if your were admitted for syncope or siezures until you have seen by Primary MD or a Neurologist and advised to do so again.  Do not drive when taking Pain medications.    Do not take more than prescribed Pain, Sleep and Anxiety Medications  Special Instructions: If you have smoked or chewed Tobacco  in the last 2 yrs please stop smoking, stop any regular Alcohol  and or any Recreational drug use.  Wear Seat belts while driving.   Please note  You were cared for by a hospitalist during your hospital stay. If you have any questions about your discharge medications or the care you received while you were in the hospital after you are discharged, you can call the unit and asked to speak with the hospitalist on call if the hospitalist that took care of you is not available. Once you are discharged, your  primary care physician will handle any further medical issues. Please note that NO REFILLS for any discharge medications will be authorized once you are discharged, as it is imperative that you return to your primary care physician (or establish a relationship with a primary care physician if you do not have one) for your aftercare needs so that they can reassess your need for medications and monitor your lab values.   Increase activity slowly   Complete by: As directed      Allergies as of 08/11/2019   No Known Allergies     Medication List    TAKE these medications   acetaminophen 500 MG tablet Commonly known as: TYLENOL  Take 500 mg by mouth every 6 (six) hours as needed for mild pain.   calcium carbonate 500 MG chewable tablet Commonly known as: Tums Chew 1 tablet (200 mg of elemental calcium total) by mouth 3 (three) times daily with meals.   gabapentin 300 MG capsule Commonly known as: NEURONTIN Take 300 mg by mouth 3 (three) times daily.   potassium chloride 10 MEQ tablet Commonly known as: K-DUR Take 1 tablet (10 mEq total) by mouth daily.   VITAMIN D PO Take 1 tablet by mouth See admin instructions. Take one tablet by mouth on Tuesday and Thursdays per patient         Diet and Activity recommendation: See Discharge Instructions above   Consults obtained -  None  Major procedures and Radiology Reports - PLEASE review detailed and final reports for all details, in brief -    No results found.  Micro Results     Recent Results (from the past 240 hour(s))  SARS CORONAVIRUS 2 (TAT 6-24 HRS) Nasopharyngeal Nasopharyngeal Swab     Status: None   Collection Time: 08/10/19  6:07 AM   Specimen: Nasopharyngeal Swab  Result Value Ref Range Status   SARS Coronavirus 2 NEGATIVE NEGATIVE Final    Comment: (NOTE) SARS-CoV-2 target nucleic acids are NOT DETECTED. The SARS-CoV-2 RNA is generally detectable in upper and lower respiratory specimens during the acute phase  of infection. Negative results do not preclude SARS-CoV-2 infection, do not rule out co-infections with other pathogens, and should not be used as the sole basis for treatment or other patient management decisions. Negative results must be combined with clinical observations, patient history, and epidemiological information. The expected result is Negative. Fact Sheet for Patients: SugarRoll.be Fact Sheet for Healthcare Providers: https://www.woods-mathews.com/ This test is not yet approved or cleared by the [place] FDA and  has been authorized for detection and/or diagnosis of SARS-CoV-2 by FDA under an Emergency Use Authorization (EUA). This EUA will remain  in effect (meaning this test can be used) for the duration of the COVID-19 declaration under Section 56 4(b)(1) of the Act, 21 U.S.C. section 360bbb-3(b)(1), unless the authorization is terminated or revoked sooner. Performed at [facility], [place] [address], [place], [place] [address]        Today   Subjective:   Eleonor Ocon today for small headache, no chest pain, no abdominal pain, no nausea or vomiting .   Objective:   Blood pressure 106/70, pulse 79, temperature 98.4 F (36.9 C), resp. rate 18, height 5' 6"  (1.676 m), weight 76.3 kg, SpO2 100 %.   Intake/Output Summary (Last 24 hours) at 08/11/2019 1534 Last data filed at 08/11/2019 0900 Gross per 24 hour  Intake 549.02 ml  Output -  Net 549.02 ml    Exam Awake Alert, Oriented x 3, No new F.N deficits, Normal affect Symmetrical Chest wall movement, Good air movement bilaterally, CTAB RRR,No Gallops,Rubs or new Murmurs, No Parasternal Heave +ve B.Sounds, Abd Soft, Non tender, ostomy bag full of clear liquid this morning, right looks clean, with no erythema or drainage, no rebound -guarding or rigidity. No Cyanosis, Clubbing or edema, No new Rash or bruise  Data Review   CBC w Diff:  Lab Results   Component Value Date   WBC 6.4 08/11/2019   HGB 9.7 (L) 08/11/2019   HGB 12.5 07/23/2018   HCT 29.6 (L) 08/11/2019   HCT 36.7 07/23/2018   PLT 378 08/11/2019   PLT 460 (H) 07/23/2018  LYMPHOPCT 12 06/17/2008   MONOPCT 9 06/17/2008   EOSPCT 1 06/17/2008   BASOPCT 0 06/17/2008    CMP:  Lab Results  Component Value Date   NA 136 08/11/2019   K 3.2 (L) 08/11/2019   CL 107 08/11/2019   CO2 22 08/11/2019   BUN <5 (L) 08/11/2019   CREATININE 0.60 08/11/2019   PROT 7.4 08/10/2019   ALBUMIN 3.2 (L) 08/10/2019   BILITOT 1.2 08/10/2019   ALKPHOS 79 08/10/2019   AST 34 08/10/2019   ALT 35 08/10/2019  .   Total Time in preparing paper work, data evaluation and todays exam - 40 minutes  Phillips Climes M.D on 08/11/2019 at 3:34 PM  [facility]   Office  [phone]

## 2019-08-12 LAB — CALCIUM, IONIZED: Calcium, Ionized, Serum: 4.4 mg/dL — ABNORMAL LOW (ref 4.5–5.6)

## 2020-02-08 ENCOUNTER — Ambulatory Visit (HOSPITAL_COMMUNITY)
Admission: EM | Admit: 2020-02-08 | Discharge: 2020-02-08 | Disposition: A | Discharge status: Home / Self Care | Payer: Medicaid Other

## 2020-02-08 ENCOUNTER — Other Ambulatory Visit: Payer: Self-pay

## 2020-02-08 ENCOUNTER — Encounter (HOSPITAL_COMMUNITY): Payer: Self-pay

## 2020-02-08 DIAGNOSIS — H6503 Acute serous otitis media, bilateral: Secondary | ICD-10-CM

## 2020-02-08 MED ORDER — CETIRIZINE HCL 10 MG PO TABS: 10.0000 mg | ORAL_TABLET | Freq: Every day | ORAL | 0 refills | Status: DC

## 2020-02-08 MED ORDER — FLUTICASONE PROPIONATE 50 MCG/ACT NA SUSP: 1.0000 | Freq: Every day | NASAL | 0 refills | Status: AC

## 2020-02-08 NOTE — Discharge Instructions (Signed)
Take the medicines as prescribed.  You can take 2 more weeks regular ear fullness and discomfort going away with these treatments.  If you develop fever, worsening constant ear pain please return to clinic or follow-up with your primary care for evaluation.

## 2020-02-08 NOTE — ED Triage Notes (Signed)
Pt is here with a left ear infection, states she has a hx of ear infections. This started 3 days ago, pt has not taken any meds to relieve discomfort.

## 2020-02-08 NOTE — ED Provider Notes (Signed)
[facility]    CSN: [id number] Arrival date & time: 02/08/20  0809      History   Chief Complaint Chief Complaint  Patient presents with  . Otitis Media    HPI Lauren Ramirez is a 34 y.o. female.   Patient presents today for primary complaint of left ear discomfort and fullness.  She reports is been on and off for some time.  It has become more noticeable over the last week or so.  She does not report as pain per se however feels pressure in the ear.  She denies fever and chills.  She does report some recent nasal congestion and runny nose.  Denies itchy or watery eyes and sneezing.  Denies headache, cough, shortness of breath, nausea, vomiting, diarrhea, change or loss of taste or smell.  She does not wish to be Covid tested.  No sick contacts and works from home.  She reports intermittent history of allergies.     Past Medical History:  Diagnosis Date  . Abnormal Pap smear    ASCUS 2011  . Crohn's disease ([place])    ostomy 06/2019  . General counseling and advice for contraceptive management 09/06/2014  . History of preterm delivery, currently pregnant in first trimester 05/26/2018   32wk, 36wk, and 35 week twins.  17p  . Nexplanon in place 09/06/2014    Patient Active Problem List   Diagnosis Date Noted  . Hypocalcemia 08/10/2019  . Hypomagnesemia 08/10/2019  . Hypokalemia 08/10/2019  . IUFD at 9 weeks or more of gestation 09/07/2018  . Exacerbation of Crohn's disease ([place]) 07/28/2018  . History of preterm delivery, currently pregnant in second trimester 05/26/2018  . History of cesarean section, low transverse 05/26/2018  . History of Crohn's disease 05/26/2018    Past Surgical History:  Procedure Laterality Date  . CESAREAN SECTION  04/14/2012   Procedure: CESAREAN SECTION;  Surgeon: Osborne Oman, MD;  Location: [facility];  Service: Gynecology;  Laterality: N/A;  primary  . DILATION AND CURETTAGE OF UTERUS    . HEMICOLECTOMY W/ OSTOMY  06/2019    done in [place]  . SMALL INTESTINE SURGERY      OB History    Gravida  5   Para  4   Term  0   Preterm  4   AB  1   Living  4     SAB  0   TAB  1   Ectopic  0   Multiple  1   Live Births  4            Home Medications    Prior to Admission medications   Medication Sig Start Date End Date Taking? Authorizing Provider  acetaminophen (TYLENOL) 500 MG tablet Take 500 mg by mouth every 6 (six) hours as needed for mild pain.    [provider]  calcium carbonate (TUMS) 500 MG chewable tablet Chew 1 tablet (200 mg of elemental calcium total) by mouth 3 (three) times daily with meals. 08/11/19 08/10/20  Elgergawy, Silver Huguenin, MD  cetirizine (ZYRTEC ALLERGY) 10 MG tablet Take 1 tablet (10 mg total) by mouth daily. 02/08/20 03/09/20  Appolonia Ackert, Marguerita Beards, PA-C  [facility] SENNA 8.6 MG tablet Take 1 tablet by mouth daily. 09/17/19   [provider]  fluticasone (FLONASE) 50 MCG/ACT nasal spray Place 1 spray into both nostrils daily. 02/08/20 03/09/20  Kylin Dubs, Marguerita Beards, PA-C  gabapentin (NEURONTIN) 300 MG capsule Take 300 mg by mouth 3 (three)  times daily.     [provider]  GAVILAX 17 GM/SCOOP powder Take 17 g by mouth daily. 09/17/19   [provider]  oxyCODONE (OXY IR/ROXICODONE) 5 MG immediate release tablet Take 5 mg by mouth every 4 (four) hours as needed. 09/17/19   [provider]  potassium chloride (K-DUR) 10 MEQ tablet Take 1 tablet (10 mEq total) by mouth daily. 08/11/19   Elgergawy, Silver Huguenin, MD  VITAMIN D PO Take 1 tablet by mouth See admin instructions. Take one tablet by mouth on Tuesday and Thursdays per patient    [provider]  Vitamin D, Ergocalciferol, (DRISDOL) 1.25 MG (50000 UNIT) CAPS capsule Take 50,000 Units by mouth 2 (two) times a week. 12/18/19   [provider]    Family History Family History  Problem Relation Age of Onset  . Hypertension Mother   . Crohn's disease Mother   . Hypertension Maternal  Grandmother   . Diabetes Maternal Grandmother   . Cancer Maternal Grandfather   . Crohn's disease Brother   . Anesthesia problems Neg Hx     Social History Social History   Tobacco Use  . Smoking status: Former Smoker    Packs/day: 0.00    Years: 7.00    Pack years: 0.00    Types: Cigarettes    Quit date: 04/15/2014    Years since quitting: 5.8  . Smokeless tobacco: Never Used  . Tobacco comment: smokes 4-5 cig daily  Substance Use Topics  . Alcohol use: Not Currently    Comment: only occasionally; before pregnancy  . Drug use: No     Allergies   Patient has no known allergies.   Review of Systems Review of Systems  Constitutional: Negative for chills, fatigue and fever.  HENT: Positive for congestion, ear pain and rhinorrhea. Negative for ear discharge, sinus pressure, sinus pain, sneezing and sore throat.   Eyes: Negative for pain and visual disturbance.  Respiratory: Negative for cough and shortness of breath.   Cardiovascular: Negative for chest pain and palpitations.  Gastrointestinal: Negative for abdominal pain, diarrhea, nausea and vomiting.  Musculoskeletal: Negative for arthralgias, back pain and myalgias.  Skin: Negative for color change and rash.  Neurological: Negative for seizures, syncope and headaches.  All other systems reviewed and are negative.    Physical Exam Triage Vital Signs ED Triage Vitals  Enc Vitals Group     BP 02/08/20 0835 (!) 147/98     Pulse Rate 02/08/20 0835 95     Resp 02/08/20 0835 18     Temp 02/08/20 0835 98.9 F (37.2 C)     Temp Source 02/08/20 0835 Oral     SpO2 02/08/20 0835 98 %     Weight 02/08/20 0833 235 lb (106.6 kg)     Height --      Head Circumference --      Peak Flow --      Pain Score 02/08/20 0832 0     Pain Loc --      Pain Edu? --      Excl. in [place]? --    No data found.  Updated Vital Signs BP (!) 147/98 (BP Location: Right Arm)   Pulse 95   Temp 98.9 F (37.2 C) (Oral)   Resp 18   Wt 235  lb (106.6 kg)   LMP 02/03/2020   SpO2 98%   BMI 37.93 kg/m   Visual Acuity Right Eye Distance:   Left Eye Distance:   Bilateral  Distance:    Right Eye Near:   Left Eye Near:    Bilateral Near:     Physical Exam Vitals and nursing note reviewed.  Constitutional:      General: She is not in acute distress.    Appearance: Normal appearance. She is well-developed. She is not ill-appearing.  HENT:     Head: Normocephalic and atraumatic.     Right Ear: Ear canal and external ear normal.     Left Ear: Ear canal and external ear normal.     Ears:     Comments: Tympanic membrane's pearly gray with visible landmarks bilaterally and cone of light present.  There is serous fluid bilaterally.    Nose:     Comments: Swollen turbinates with mild erythema.  Clear nasal discharge present    Mouth/Throat:     Mouth: Mucous membranes are moist.     Pharynx: Oropharynx is clear.  Eyes:     Extraocular Movements: Extraocular movements intact.     Conjunctiva/sclera: Conjunctivae normal.     Pupils: Pupils are equal, round, and reactive to light.  Cardiovascular:     Rate and Rhythm: Normal rate.  Pulmonary:     Effort: Pulmonary effort is normal. No respiratory distress.  Musculoskeletal:     Cervical back: Normal range of motion.  Skin:    General: Skin is warm and dry.  Neurological:     Mental Status: She is alert.      UC Treatments / Results  Labs (all labs ordered are listed, but only abnormal results are displayed) Labs Reviewed - No data to display  EKG   Radiology No results found.  Procedures Procedures (including critical care time)  Medications Ordered in UC Medications - No data to display  Initial Impression / Assessment and Plan / UC Course  I have reviewed the triage vital signs and the nursing notes.  Pertinent labs & imaging results that were available during my care of the patient were reviewed by me and considered in my medical decision making (see  chart for details).     #Serous otitis media Patient 34 year old female presenting with symptoms consistent with serous otitis media.  No evidence of acute otitis media at this time.  Patient declined Covid testing however low suspicion given minimal other symptoms.  Believe there to be an aspect of allergic rhinitis.  Will start on Zyrtec and Flonase daily.  Instructed to follow-up if worsening pain or development of fever.  Patient understands and agrees with plan Final Clinical Impressions(s) / UC Diagnoses   Final diagnoses:  Bilateral acute serous otitis media, recurrence not specified     Discharge Instructions     Take the medicines as prescribed.  You can take 2 more weeks regular ear fullness and discomfort going away with these treatments.  If you develop fever, worsening constant ear pain please return to clinic or follow-up with your primary care for evaluation.   ED Prescriptions    Medication Sig Dispense Auth. Provider   cetirizine (ZYRTEC ALLERGY) 10 MG tablet Take 1 tablet (10 mg total) by mouth daily. 30 tablet Dahna Hattabaugh, Marguerita Beards, PA-C   fluticasone (FLONASE) 50 MCG/ACT nasal spray Place 1 spray into both nostrils daily. 1 g Kevan Prouty, Marguerita Beards, PA-C     PDMP not reviewed this encounter.   Purnell Shoemaker, PA-C 02/08/20 [phone]

## 2020-02-22 ENCOUNTER — Encounter (HOSPITAL_COMMUNITY): Payer: Self-pay | Admitting: Emergency Medicine

## 2020-02-22 ENCOUNTER — Other Ambulatory Visit: Payer: Self-pay

## 2020-02-22 ENCOUNTER — Emergency Department (HOSPITAL_COMMUNITY)
Admission: EM | Admit: 2020-02-22 | Discharge: 2020-02-22 | Disposition: A | Discharge status: Home / Self Care | Payer: Medicaid Other | Attending: Emergency Medicine | Admitting: Emergency Medicine

## 2020-02-22 DIAGNOSIS — F410 Panic disorder [episodic paroxysmal anxiety] without agoraphobia: Secondary | ICD-10-CM | POA: Diagnosis present

## 2020-02-22 DIAGNOSIS — H9202 Otalgia, left ear: Secondary | ICD-10-CM | POA: Diagnosis not present

## 2020-02-22 DIAGNOSIS — F430 Acute stress reaction: Secondary | ICD-10-CM | POA: Insufficient documentation

## 2020-02-22 DIAGNOSIS — Z79899 Other long term (current) drug therapy: Secondary | ICD-10-CM | POA: Diagnosis not present

## 2020-02-22 DIAGNOSIS — Z87891 Personal history of nicotine dependence: Secondary | ICD-10-CM | POA: Insufficient documentation

## 2020-02-22 DIAGNOSIS — R002 Palpitations: Secondary | ICD-10-CM | POA: Diagnosis not present
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
## 2020-02-22 NOTE — ED Triage Notes (Signed)
Patient reports panic/anxiety attack last night with mild dizziness and left ear discomfort .

## 2020-02-22 NOTE — Discharge Instructions (Addendum)
Please consider taking a daily allergy medication to help with your symptoms.  I suggest a less drowsy 24 hour medication such as allegra, zyrtec or Claritin or the generic version.  Please resume your flonase.   If you develop concerning symptoms or your symptoms return please seek additional medical care and evaluation.  Some concerning symptoms include chest pain, fevers, shortness of breath, feeling like your heart is fluttering with out a stressful event immediately beforehand, or have any concerns.

## 2020-08-21 IMAGING — MR MR ABDOMEN W/O CM
14 of 16 series · 36 of 48 positions shown · non-contrast
Comparison: CT scan from 04/17/2017

CLINICAL DATA: Second trimester pregnancy, history of Crohn's
disease. Dyspepsia and gagging sensation.

EXAM:
MRI ABDOMEN AND PELVIS WITHOUT CONTRAST
TECHNIQUE: Multiplanar multisequence MR imaging of the abdomen and pelvis was
performed. No intravenous contrast was administered.

[Series 4: cor haste · coronal · 6.0mm · 1.31mm/px · 3 of 30 slices shown]
[im 1/30]
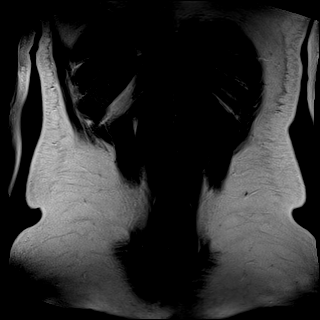
[im 15/30]
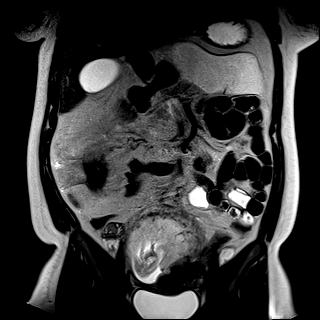
[im 30/30]
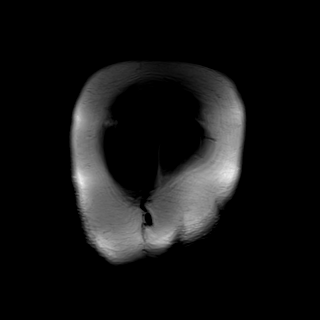

[Series 5: cor haste fs · coronal · 6.0mm · 1.25mm/px · 3 of 30 slices shown]
[im 1/30]
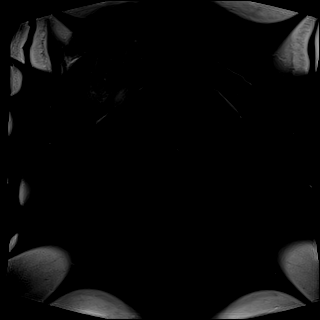
[im 15/30]
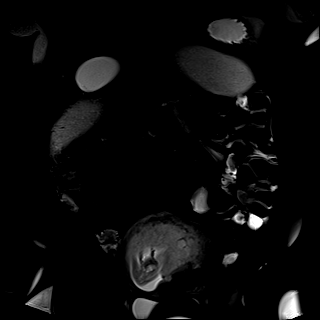
[im 30/30]
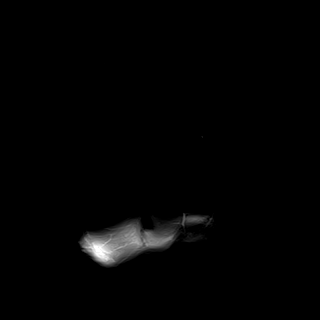

[Series 6: bSSFP · coronal · 6.0mm · 0.74mm/px · 2 of 30 slices shown (1 of 4)]
[im 1/30]
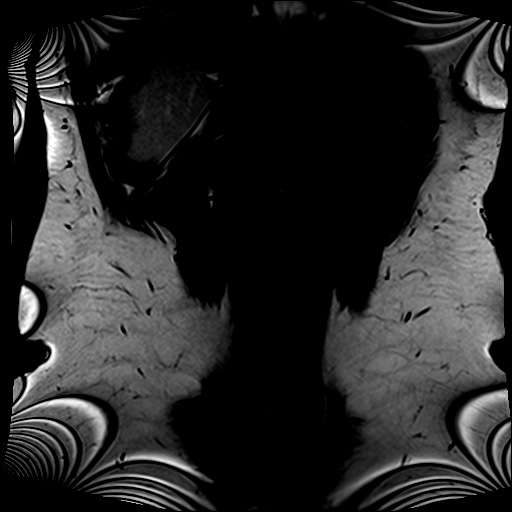
[im 30/30]
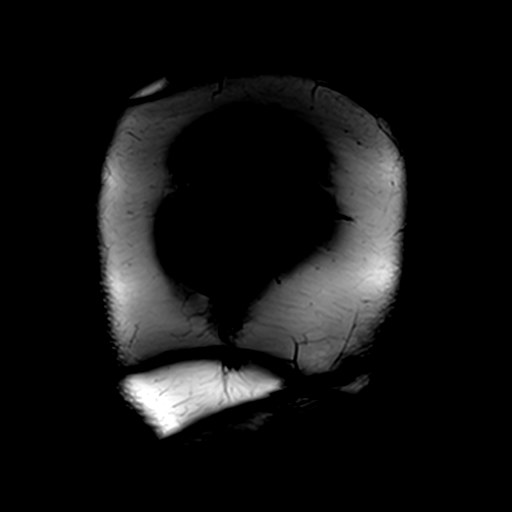

[Series 7: ax haste · axial · 5.0mm · 1.19mm/px · z∈[-182,-8]mm · 2 of 30 slices shown (1 of 2)]
[im 1/30]
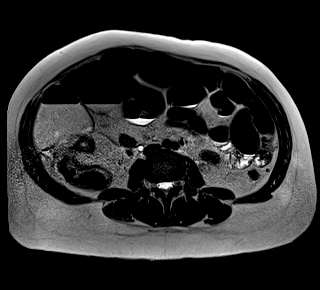
[im 30/30]
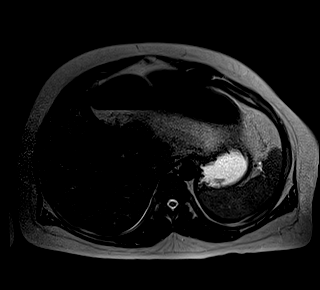

[Series 8: ax haste · axial · 5.0mm · 1.19mm/px · z∈[-362,-188]mm · 2 of 30 slices shown (2 of 2)]
[im 1/30]
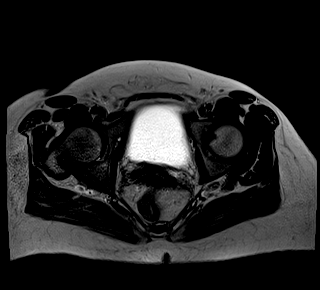
[im 30/30]
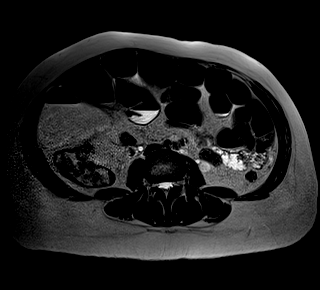

[Series 9: ax haste_comp · axial · 5.0mm · 1.19mm/px · z∈[-362,-8]mm · 4 of 60 slices shown]
[im 1/60]
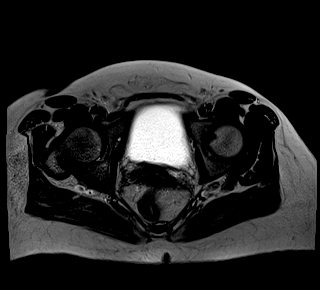
[im 20/60]
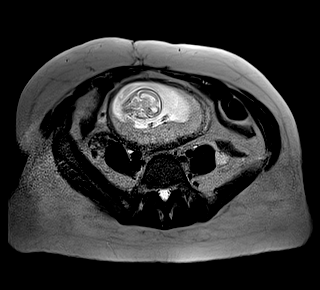
[im 40/60]
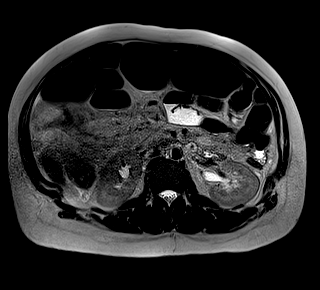
[im 60/60]
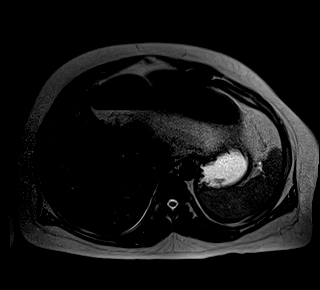

[Series 14: T2 fat-sat · axial · 5.0mm · 1.19mm/px · z∈[-143,+31]mm · 2 of 30 slices shown]
[im 1/30]
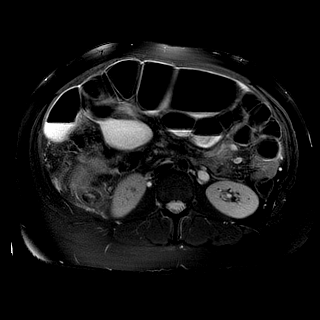
[im 30/30]
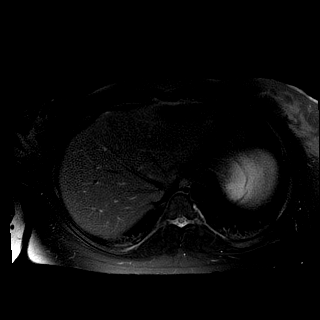

[Series 20: T2 · axial · 5.0mm · 1.19mm/px · z∈[-320,-56]mm · 2 of 30 slices shown]
[im 1/30]
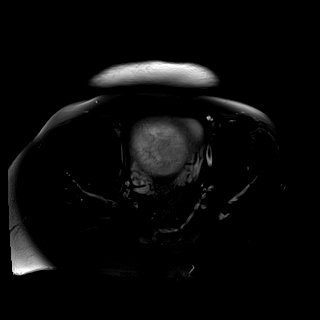
[im 30/30]
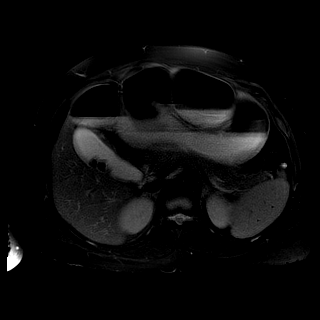

[Series 21: bSSFP · axial · 5.0mm · 0.74mm/px · z∈[-150,+24]mm · 2 of 30 slices shown (2 of 4)]
[im 1/30]
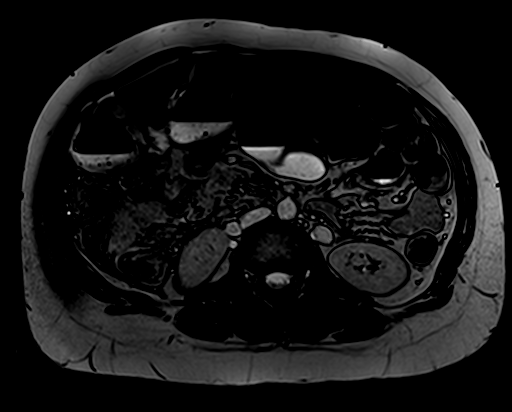
[im 30/30]
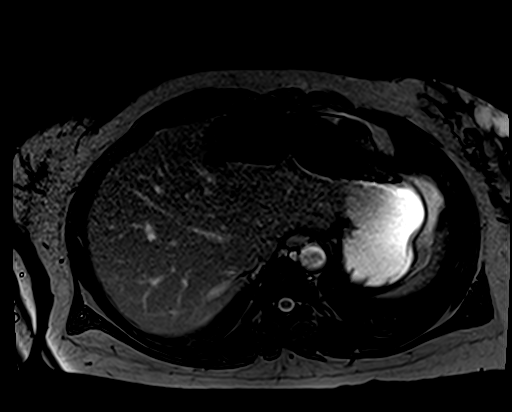

[Series 22: bSSFP · axial · 5.0mm · 0.74mm/px · z∈[-330,-156]mm · 2 of 30 slices shown (3 of 4)]
[im 1/30]
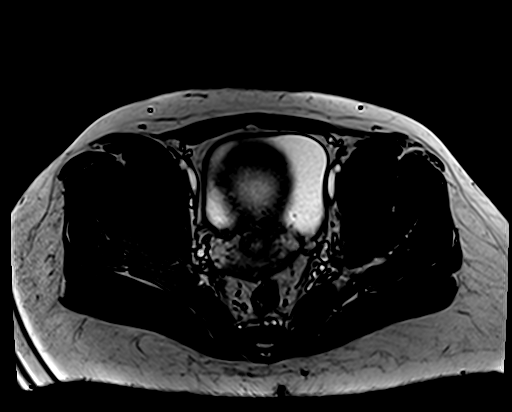
[im 30/30]
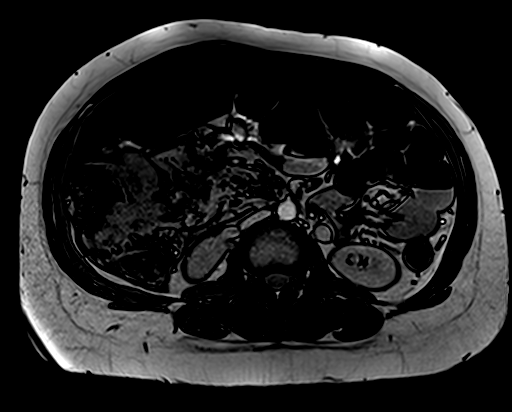

[Series 23: bSSFP · axial · 5.0mm · 0.74mm/px · z∈[-330,+24]mm · 4 of 60 slices shown (4 of 4)]
[im 1/60]
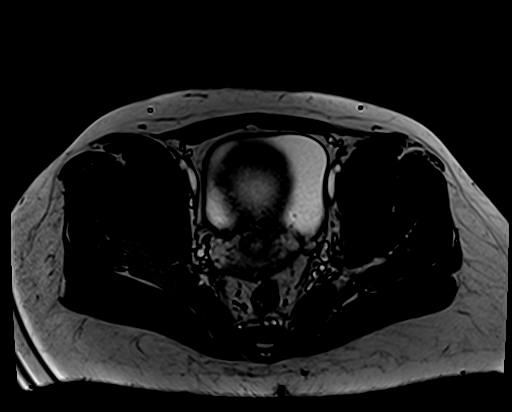
[im 20/60]
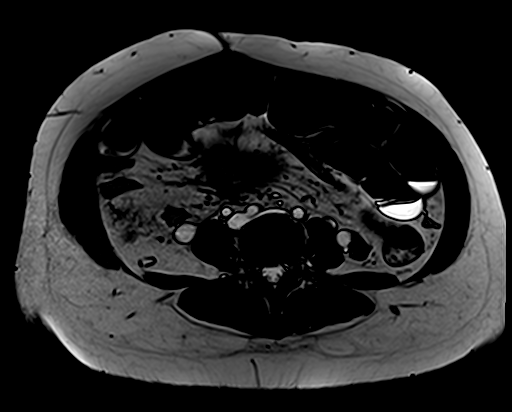
[im 40/60]
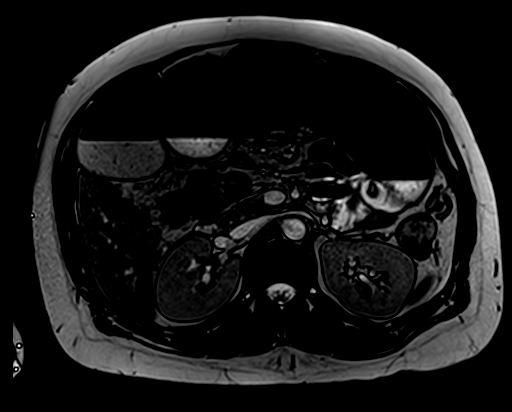
[im 60/60]
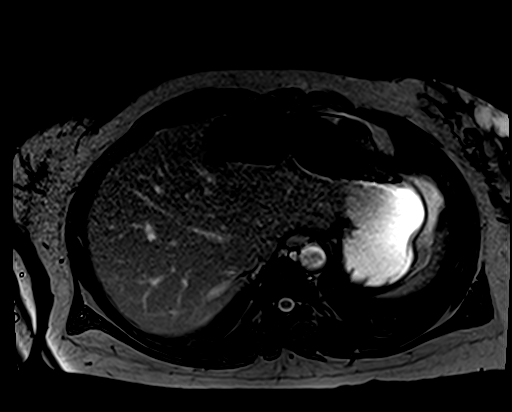

[Series 24: T1 · axial · 6.0mm · 1.48mm/px · z∈[-140,+69]mm · 2 of 30 slices shown (1 of 3)]
[im 1/30]
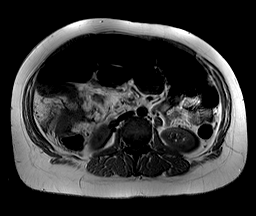
[im 30/30]
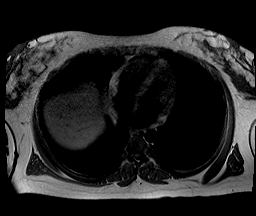

[Series 25: T1 · axial · 6.0mm · 1.48mm/px · z∈[-356,-147]mm · 2 of 30 slices shown (2 of 3)]
[im 1/30]
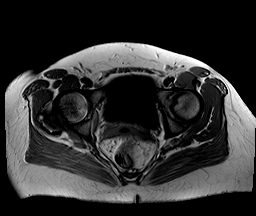
[im 30/30]
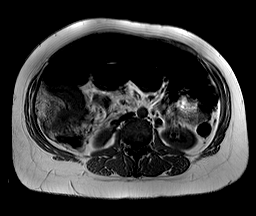

[Series 26: T1 · axial · 6.0mm · 1.48mm/px · z∈[-356,+69]mm · 4 of 60 slices shown (3 of 3)]
[im 1/60]
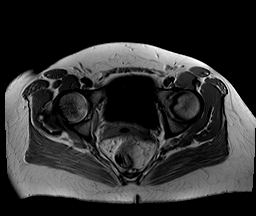
[im 20/60]
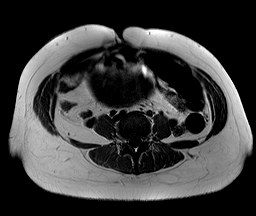
[im 40/60]
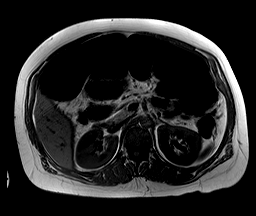
[im 60/60]
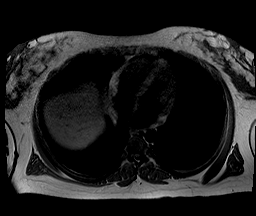

[36 of 48 positions shown; findings below may reference images not displayed]

FINDINGS: COMBINED FINDINGS FOR BOTH MR ABDOMEN AND PELVIS

Lower chest: Unremarkable where included.

Hepatobiliary: There are about 5 gallstones ranging in size up to
about 10 mm in diameter. No significant degree of gallbladder wall
thickening or pericholecystic fluid. No biliary dilatation or
appreciable choledocholithiasis. Hepatic parenchyma unremarkable
where included.

Pancreas:  Unremarkable

Spleen:  Unremarkable

Adrenals/Urinary Tract: Mild right hydroureter extending to the
iliac vessel cross over where the ureter appears mildly compressed
between the gravid uterus and the right iliac vasculature.
Borderline right hydronephrosis.

Adrenal glands unremarkable.

Stomach/Bowel: Prominent wall thickening in the distal 9 cm loop of
distal ileum extending to the ileo colic anastomosis. Extensive
surrounding edema and inflammatory phlegmon in this vicinity, with a
5.4 by 3.5 cm phlegmon or region of incipient abscess shown
posterior to the inflamed loop of bowel on image 64/27. Trace
adjacent free fluid in the right paracolic gutter. There is likely
secondary inflammation of the ascending colon adjacent to this
phlegmon.

There are dilated loops of small bowel extending to the inflamed
loop. The dilated small bowel is up to 5.7 cm in diameter.

Unusual configuration of the transverse colon as before, tracking up
into the gastrohepatic space and somewhat posterior in position

Vascular/Lymphatic: Scattered reactive mesenteric and porta hepatis
lymph nodes are present. Reactive external iliac lymph nodes on the
right.

Reproductive: Gravid uterus noted with single intrauterine fetus and
posterior placenta without visualized previa. Today's exam was not
optimized for fetal assessment. Amount of amniotic fluid appears
appropriate.

Other:  No supplemental non-categorized findings.

Musculoskeletal: Unremarkable
IMPRESSION: 1. Prominent wall thickening and inflammation of the terminal 9 cm
of the small bowel extending to the ileo colic anastomosis
compatible with active Crohn's disease. Adjacent 5.4 by 3.5 cm
region of phlegmon or incipient abscess formation posterior to the
inflamed loop of bowel. I am skeptical that this is a true drainable
abscess at this point. Notable surrounding inflammatory findings and
trace fluid in the right paracolic gutter.
2. Notable dilatation of the small bowel proximal to this inflamed
loop, up to 5.7 cm in diameter.
3. Somewhat unusual posterior positioning of the transverse colon
which extends up into the gastrohepatic space, as on the prior CT
scan.
4. Mild right hydroureter extending down to the iliac vessel where
the ureter appears mildly compress by the gravid uterus. Borderline
right hydronephrosis.
5. Cholelithiasis.
6. No appreciable fetal complication although today's exam was not
protocolled for fetal assessment.

## 2020-11-02 ENCOUNTER — Other Ambulatory Visit: Payer: Self-pay

## 2020-11-02 ENCOUNTER — Encounter (HOSPITAL_COMMUNITY): Payer: Self-pay

## 2020-11-02 ENCOUNTER — Ambulatory Visit (HOSPITAL_COMMUNITY)
Admission: RE | Admit: 2020-11-02 | Discharge: 2020-11-02 | Disposition: A | Discharge status: Home / Self Care | Payer: Medicaid Other | Source: Ambulatory Visit | Attending: Family Medicine | Admitting: Family Medicine

## 2020-11-02 VITALS — BP 123/84 | HR 80 | Temp 98.1°F | Resp 18

## 2020-11-02 DIAGNOSIS — M5410 Radiculopathy, site unspecified: Secondary | ICD-10-CM | POA: Diagnosis not present

## 2020-11-02 MED ORDER — MELOXICAM 15 MG PO TABS: 15.0000 mg | ORAL_TABLET | Freq: Every day | ORAL | 0 refills | Status: DC

## 2020-11-02 NOTE — ED Provider Notes (Signed)
[place]    CSN: [id number] Arrival date & time: 11/02/20  1135      History   Chief Complaint Chief Complaint  Patient presents with   Appointment   Shoulder Pain    HPI Lauren Ramirez is a 34 y.o. female.   HPI  Patient presents today with right mid thoracic pain following reaching for something on a shelf on Monday.  She reports that the pain was more severe than it is today.  She has been managing pain with Tylenol.  She missed work she is in today for a work note to be able to return to work Monday.  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  She denies any neck pain, any headaches or any numbness and tingling radiating to her lower back or neck.  Past Medical History:  Diagnosis Date   Abnormal Pap smear    ASCUS 2011   Crohn's disease [facility])    ostomy 06/2019   General counseling and advice for contraceptive management 09/06/2014   History of preterm delivery, currently pregnant in first trimester 05/26/2018   32wk, 36wk, and 35 week twins.  17p   Nexplanon in place 09/06/2014    Patient Active Problem List   Diagnosis Date Noted   Hypocalcemia 08/10/2019   Hypomagnesemia 08/10/2019   Hypokalemia 08/10/2019   IUFD at 17 weeks or more of gestation 09/07/2018   Exacerbation of Crohn's disease ([place]) 07/28/2018   History of preterm delivery, currently pregnant in second trimester 05/26/2018   History of cesarean section, low transverse 05/26/2018   History of Crohn's disease 05/26/2018    Past Surgical History:  Procedure Laterality Date   CESAREAN SECTION  04/14/2012   Procedure: CESAREAN SECTION;  Surgeon: Osborne Oman, MD;  Location: [place] [facility];  Service: Gynecology;  Laterality: N/A;  primary   DILATION AND CURETTAGE OF UTERUS     HEMICOLECTOMY W/ OSTOMY  06/2019   done in [place]      OB History    Gravida  5   Para  4   Term  0   Preterm  4   AB  1    Living  4     SAB  0   TAB  1   Ectopic  0   Multiple  1   Live Births  4            Home Medications    Prior to Admission medications   Medication Sig Start Date End Date Taking? Authorizing Provider  acetaminophen (TYLENOL) 500 MG tablet Take 500 mg by mouth every 6 (six) hours as needed for mild pain.    [provider]  cetirizine (ZYRTEC ALLERGY) 10 MG tablet Take 1 tablet (10 mg total) by mouth daily. 02/08/20 03/09/20  Darr, Edison Nasuti, PA-C  [facility] SENNA 8.6 MG tablet Take 1 tablet by mouth daily. 09/17/19   [provider]  fluticasone (FLONASE) 50 MCG/ACT nasal spray Place 1 spray into both nostrils daily. 02/08/20 03/09/20  Darr, Edison Nasuti, PA-C  gabapentin (NEURONTIN) 300 MG capsule Take 300 mg by mouth 3 (three) times daily.     [provider]  GAVILAX 17 GM/SCOOP powder Take 17 g by mouth daily. 09/17/19   [provider]  oxyCODONE (OXY IR/ROXICODONE) 5 MG immediate release tablet Take 5 mg by mouth every 4 (four) hours as needed. 09/17/19   [provider]  potassium chloride (K-DUR) 10  MEQ tablet Take 1 tablet (10 mEq total) by mouth daily. 08/11/19   Elgergawy, Silver Huguenin, MD  VITAMIN D PO Take 1 tablet by mouth See admin instructions. Take one tablet by mouth on Tuesday and Thursdays per patient    [provider]  Vitamin D, Ergocalciferol, (DRISDOL) 1.25 MG (50000 UNIT) CAPS capsule Take 50,000 Units by mouth 2 (two) times a week. 12/18/19   [provider]    Family History Family History  Problem Relation Age of Onset   Hypertension Mother    Crohn's disease Mother    Hypertension Maternal Grandmother    Diabetes Maternal Grandmother    Cancer Maternal Grandfather    Crohn's disease Brother    Anesthesia problems Neg Hx     Social History Social History   Tobacco Use   Smoking status: Former Smoker    Packs/day: 0.00    Years: 7.00    Pack years: 0.00    Types: Cigarettes    Quit date:  04/15/2014    Years since quitting: 6.5   Smokeless tobacco: Never Used   Tobacco comment: smokes 4-5 cig daily  Vaping Use   Vaping Use: Never used  Substance Use Topics   Alcohol use: Not Currently    Comment: only occasionally; before pregnancy   Drug use: No     Allergies   Patient has no known allergies.   Review of Systems Review of Systems Pertinent negatives listed in HPI  Physical Exam Triage Vital Signs ED Triage Vitals  Enc Vitals Group     BP 11/02/20 1231 123/84     Pulse Rate 11/02/20 1231 80     Resp 11/02/20 1231 18     Temp 11/02/20 1231 98.1 F (36.7 C)     Temp Source 11/02/20 1231 Oral     SpO2 11/02/20 1231 100 %     Weight --      Height --      Head Circumference --      Peak Flow --      Pain Score 11/02/20 1229 6     Pain Loc --      Pain Edu? --      Excl. in [place]? --    No data found.  Updated Vital Signs BP 123/84 (BP Location: Left Arm)    Pulse 80    Temp 98.1 F (36.7 C) (Oral)    Resp 18    LMP 10/17/2020    SpO2 100%   Visual Acuity Right Eye Distance:   Left Eye Distance:   Bilateral Distance:    Right Eye Near:   Left Eye Near:    Bilateral Near:     Physical Exam Constitutional:      Appearance: She is obese.  Cardiovascular:     Rate and Rhythm: Normal rate and regular rhythm.  Pulmonary:     Effort: Pulmonary effort is normal.     Breath sounds: Normal breath sounds.  Musculoskeletal:        General: Normal range of motion.     Comments: Normal spinous process.  No spinous tenderness.  Full range of motion.  Skin:    Capillary Refill: Capillary refill takes less than 2 seconds.  Neurological:     General: No focal deficit present.     Mental Status: She is alert.     Motor: No weakness.     Coordination: Coordination normal.     Gait: Gait normal.  Psychiatric:  Mood and Affect: Mood normal.        Behavior: Behavior normal.        Thought Content: Thought content normal.        Judgment:  Judgment normal.      UC Treatments / Results  Labs (all labs ordered are listed, but only abnormal results are displayed) Labs Reviewed - No data to display  EKG   Radiology No results found.  Procedures Procedures (including critical care time)  Medications Ordered in UC Medications - No data to display  Initial Impression / Assessment and Plan / UC Course  I have reviewed the triage vital signs and the nursing notes.  Pertinent labs & imaging results that were available during my care of the patient were reviewed by me and considered in my medical decision making (see chart for details).    Acute midthoracic back pain related to back strain.  Treating for meloxicam 15 mg once daily as needed.  Continue Tylenol for breakthrough pain.  Work note provided to return to work without restrictions on 11/06/2020.Advised work note could not be backdated however advised that patient was seen today for an injury which occurred on 1129. Final Clinical Impressions(s) / UC Diagnoses   Final diagnoses:  Radiculopathy, unspecified spinal region   Discharge Instructions   None    ED Prescriptions    Medication Sig Dispense Auth. Provider   meloxicam (MOBIC) 15 MG tablet Take 1 tablet (15 mg total) by mouth daily. 30 tablet Scot Jun, FNP     PDMP not reviewed this encounter.   Scot Jun, FNP 11/02/20 1252

## 2020-11-02 NOTE — ED Triage Notes (Signed)
Pt presents for right shoulder pain after reaching for something on Monday in an awkward position.

## 2021-02-15 DIAGNOSIS — K5000 Crohn's disease of small intestine without complications: Secondary | ICD-10-CM | POA: Diagnosis not present

## 2021-08-15 ENCOUNTER — Other Ambulatory Visit: Payer: Medicaid Other

## 2021-08-15 DIAGNOSIS — E559 Vitamin D deficiency, unspecified: Secondary | ICD-10-CM | POA: Diagnosis not present

## 2021-08-15 DIAGNOSIS — E611 Iron deficiency: Secondary | ICD-10-CM | POA: Diagnosis not present

## 2021-08-15 DIAGNOSIS — E538 Deficiency of other specified B group vitamins: Secondary | ICD-10-CM | POA: Diagnosis not present

## 2021-08-15 DIAGNOSIS — K5000 Crohn's disease of small intestine without complications: Secondary | ICD-10-CM | POA: Diagnosis not present

## 2021-08-28 DIAGNOSIS — E611 Iron deficiency: Secondary | ICD-10-CM | POA: Diagnosis not present

## 2021-10-23 DIAGNOSIS — E538 Deficiency of other specified B group vitamins: Secondary | ICD-10-CM | POA: Diagnosis not present

## 2021-10-26 DIAGNOSIS — H5213 Myopia, bilateral: Secondary | ICD-10-CM | POA: Diagnosis not present

## 2021-11-09 DIAGNOSIS — H52223 Regular astigmatism, bilateral: Secondary | ICD-10-CM | POA: Diagnosis not present

## 2021-11-09 DIAGNOSIS — H5203 Hypermetropia, bilateral: Secondary | ICD-10-CM | POA: Diagnosis not present

## 2021-11-23 DIAGNOSIS — E538 Deficiency of other specified B group vitamins: Secondary | ICD-10-CM | POA: Diagnosis not present

## 2022-02-01 DIAGNOSIS — E538 Deficiency of other specified B group vitamins: Secondary | ICD-10-CM | POA: Diagnosis not present

## 2022-04-26 DIAGNOSIS — E538 Deficiency of other specified B group vitamins: Secondary | ICD-10-CM | POA: Diagnosis not present

## 2022-05-30 ENCOUNTER — Ambulatory Visit (INDEPENDENT_AMBULATORY_CARE_PROVIDER_SITE_OTHER): Payer: Medicaid Other

## 2022-05-30 ENCOUNTER — Encounter (HOSPITAL_COMMUNITY): Payer: Self-pay

## 2022-05-30 ENCOUNTER — Ambulatory Visit (HOSPITAL_COMMUNITY)
Admission: EM | Admit: 2022-05-30 | Discharge: 2022-05-30 | Disposition: A | Discharge status: Home / Self Care | Payer: Medicaid Other | Attending: Internal Medicine | Admitting: Internal Medicine

## 2022-05-30 DIAGNOSIS — S62653A Nondisplaced fracture of medial phalanx of left middle finger, initial encounter for closed fracture: Secondary | ICD-10-CM

## 2022-05-30 NOTE — ED Triage Notes (Signed)
Pt states jammed her lt ring finger playing foot ball yesterday. Has it buddy tape now.

## 2022-05-30 NOTE — Discharge Instructions (Addendum)
Follow up with Orthopaedist for recheck.

## 2022-05-30 NOTE — ED Provider Notes (Signed)
[facility]    CSN: [id number] Arrival date & time: 05/30/22  0919      History   Chief Complaint Chief Complaint  Patient presents with   Finger Injury    HPI Lauren Ramirez is a 36 y.o. female.   Pt injured her finger playing football.  Pt complains of bruising and pain   The history is provided by the patient. No language interpreter was used.    Past Medical History:  Diagnosis Date   Abnormal Pap smear    ASCUS 2011   Crohn's disease [facility])    ostomy 06/2019   General counseling and advice for contraceptive management 09/06/2014   History of preterm delivery, currently pregnant in first trimester 05/26/2018   32wk, 36wk, and 35 week twins.  17p   Nexplanon in place 09/06/2014    Patient Active Problem List   Diagnosis Date Noted   Hypocalcemia 08/10/2019   Hypomagnesemia 08/10/2019   Hypokalemia 08/10/2019   IUFD at 75 weeks or more of gestation 09/07/2018   Exacerbation of Crohn's disease ([place]) 07/28/2018   History of preterm delivery, currently pregnant in second trimester 05/26/2018   History of cesarean section, low transverse 05/26/2018   History of Crohn's disease 05/26/2018    Past Surgical History:  Procedure Laterality Date   CESAREAN SECTION  04/14/2012   Procedure: CESAREAN SECTION;  Surgeon: Osborne Oman, MD;  Location: [facility];  Service: Gynecology;  Laterality: N/A;  primary   DILATION AND CURETTAGE OF UTERUS     HEMICOLECTOMY W/ OSTOMY  06/2019   done in [place]      OB History     Gravida  5   Para  4   Term  0   Preterm  4   AB  1   Living  4      SAB  0   IAB  1   Ectopic  0   Multiple  1   Live Births  4            Home Medications    Prior to Admission medications   Medication Sig Start Date End Date Taking? Authorizing Provider  acetaminophen (TYLENOL) 500 MG tablet Take 500 mg by mouth every 6 (six) hours as needed for mild pain.    [provider]  cetirizine (ZYRTEC ALLERGY) 10 MG tablet Take 1 tablet (10 mg total) by mouth daily. 02/08/20 03/09/20  Darr, Edison Nasuti, PA-C  [facility] SENNA 8.6 MG tablet Take 1 tablet by mouth daily. 09/17/19   [provider]  fluticasone (FLONASE) 50 MCG/ACT nasal spray Place 1 spray into both nostrils daily. 02/08/20 03/09/20  Darr, Edison Nasuti, PA-C    Family History Family History  Problem Relation Age of Onset   Hypertension Mother    Crohn's disease Mother    Hypertension Maternal Grandmother    Diabetes Maternal Grandmother    Cancer Maternal Grandfather    Crohn's disease Brother    Anesthesia problems Neg Hx     Social History Social History   Tobacco Use   Smoking status: Former    Packs/day: 0.00    Years: 7.00    Total pack years: 0.00    Types: Cigarettes    Quit date: 04/15/2014    Years since quitting: 8.1   Smokeless tobacco: Never   Tobacco comments:    smokes 4-5 cig daily  Vaping Use   Vaping Use: Never used  Substance Use Topics  Alcohol use: Not Currently    Comment: only occasionally; before pregnancy   Drug use: No     Allergies   Patient has no known allergies.   Review of Systems Review of Systems  All other systems reviewed and are negative.    Physical Exam Triage Vital Signs ED Triage Vitals  Enc Vitals Group     BP 05/30/22 1017 (!) 146/99     Pulse Rate 05/30/22 1017 87     Resp 05/30/22 1017 18     Temp 05/30/22 1017 97.9 F (36.6 C)     Temp Source 05/30/22 1017 Oral     SpO2 05/30/22 1017 98 %     Weight --      Height --      Head Circumference --      Peak Flow --      Pain Score 05/30/22 1018 8     Pain Loc --      Pain Edu? --      Excl. in [place]? --    No data found.  Updated Vital Signs BP (!) 146/99 (BP Location: Left Arm)   Pulse 87   Temp 97.9 F (36.6 C) (Oral)   Resp 18   LMP 05/22/2022   SpO2 98%   Visual Acuity Right Eye Distance:   Left Eye Distance:   Bilateral Distance:    Right Eye Near:   Left Eye Near:     Bilateral Near:     Physical Exam Vitals and nursing note reviewed.  Constitutional:      Appearance: She is well-developed.  HENT:     Head: Normocephalic.  Pulmonary:     Effort: Pulmonary effort is normal.  Abdominal:     General: There is no distension.  Musculoskeletal:        General: Normal range of motion.     Cervical back: Normal range of motion.     Comments: Swollen left ring finger,  pain with range of motion  nv and ns intact    Neurological:     Mental Status: She is alert and oriented to person, place, and time.  Psychiatric:        Mood and Affect: Mood normal.      UC Treatments / Results  Labs (all labs ordered are listed, but only abnormal results are displayed) Labs Reviewed - No data to display  EKG   Radiology DG Finger Ring Left  Result Date: 05/30/2022 CLINICAL DATA:  Ring finger injury EXAM: LEFT RING FINGER 2+V COMPARISON:  None Available. FINDINGS: Subtle nondisplaced fracture along the volar base of the ring finger middle phalanx seen only on lateral view. Associated soft tissue swelling. No additional fractures. No dislocation. No significant arthropathy. IMPRESSION: Subtle nondisplaced fracture along the volar base of the ring finger middle phalanx. Electronically Signed   By: Davina Poke D.O.   On: 05/30/2022 10:31    Procedures Procedures (including critical care time)  Medications Ordered in UC Medications - No data to display  Initial Impression / Assessment and Plan / UC Course  I have reviewed the triage vital signs and the nursing notes.  Pertinent labs & imaging results that were available during my care of the patient were reviewed by me and considered in my medical decision making (see chart for details).     MDM:  Pt counseled on finger fracture.  Pt advised to follow up with Hand for evaltuion  Final Clinical Impressions(s) / UC Diagnoses  Final diagnoses:  Closed nondisplaced fracture of middle phalanx of left  middle finger, initial encounter     Discharge Instructions      Follow up with Orthopaedist for recheck.    ED Prescriptions   None    An After Visit Summary was printed and given to the patient.  PDMP not reviewed this encounter.   Fransico Meadow, [place] 05/30/22 1542

## 2022-06-03 ENCOUNTER — Encounter (HOSPITAL_COMMUNITY): Payer: Self-pay

## 2022-06-03 ENCOUNTER — Ambulatory Visit (HOSPITAL_COMMUNITY)
Admission: RE | Admit: 2022-06-03 | Discharge: 2022-06-03 | Disposition: A | Discharge status: Home / Self Care | Payer: Medicaid Other | Source: Ambulatory Visit | Attending: Emergency Medicine | Admitting: Emergency Medicine

## 2022-06-03 VITALS — BP 141/94 | HR 90 | Temp 98.5°F | Resp 17

## 2022-06-03 DIAGNOSIS — S62659A Nondisplaced fracture of medial phalanx of unspecified finger, initial encounter for closed fracture: Secondary | ICD-10-CM | POA: Diagnosis not present

## 2022-06-03 NOTE — ED Provider Notes (Signed)
[facility]    CSN: [id number] Arrival date & time: 06/03/22  1341      History   Chief Complaint Chief Complaint  Patient presents with   Follow-up    Finger Fracture pain - Entered by patient    HPI Lauren Ramirez is a 36 y.o. female.   Patient presents requesting extended work note due to fracture of her index finger on her left hand.  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  Was evaluated by orthopedics per urgent care recommendation who gave her a work note for light duty.  Per patient based on job role her job is unable to accommodate light duty and she needs to be completely right now until she recovers.  Also requesting referral to occupational health.   Past Medical History:  Diagnosis Date   Abnormal Pap smear    ASCUS 2011   Crohn's disease [facility])    ostomy 06/2019   General counseling and advice for contraceptive management 09/06/2014   History of preterm delivery, currently pregnant in first trimester 05/26/2018   32wk, 36wk, and 35 week twins.  17p   Nexplanon in place 09/06/2014    Patient Active Problem List   Diagnosis Date Noted   Hypocalcemia 08/10/2019   Hypomagnesemia 08/10/2019   Hypokalemia 08/10/2019   IUFD at 12 weeks or more of gestation 09/07/2018   Exacerbation of Crohn's disease ([place]) 07/28/2018   History of preterm delivery, currently pregnant in second trimester 05/26/2018   History of cesarean section, low transverse 05/26/2018   History of Crohn's disease 05/26/2018    Past Surgical History:  Procedure Laterality Date   CESAREAN SECTION  04/14/2012   Procedure: CESAREAN SECTION;  Surgeon: Osborne Oman, MD;  Location: [facility];  Service: Gynecology;  Laterality: N/A;  primary   DILATION AND CURETTAGE OF UTERUS     HEMICOLECTOMY W/ OSTOMY  06/2019   done in [place]      OB History     Gravida  5   Para  4   Term  0    Preterm  4   AB  1   Living  4      SAB  0   IAB  1   Ectopic  0   Multiple  1   Live Births  4            Home Medications    Prior to Admission medications   Medication Sig Start Date End Date Taking? Authorizing Provider  acetaminophen (TYLENOL) 500 MG tablet Take 500 mg by mouth every 6 (six) hours as needed for mild pain.    [provider]  cetirizine (ZYRTEC ALLERGY) 10 MG tablet Take 1 tablet (10 mg total) by mouth daily. 02/08/20 03/09/20  Darr, Edison Nasuti, PA-C  [facility] SENNA 8.6 MG tablet Take 1 tablet by mouth daily. 09/17/19   [provider]  fluticasone (FLONASE) 50 MCG/ACT nasal spray Place 1 spray into both nostrils daily. 02/08/20 03/09/20  Darr, Edison Nasuti, PA-C    Family History Family History  Problem Relation Age of Onset   Hypertension Mother    Crohn's disease Mother    Hypertension Maternal Grandmother    Diabetes Maternal Grandmother    Cancer Maternal Grandfather    Crohn's disease Brother    Anesthesia problems Neg Hx     Social History Social History   Tobacco  Use   Smoking status: Former    Packs/day: 0.00    Years: 7.00    Total pack years: 0.00    Types: Cigarettes    Quit date: 04/15/2014    Years since quitting: 8.1   Smokeless tobacco: Never   Tobacco comments:    smokes 4-5 cig daily  Vaping Use   Vaping Use: Never used  Substance Use Topics   Alcohol use: Not Currently    Comment: only occasionally; before pregnancy   Drug use: No     Allergies   Patient has no known allergies.   Review of Systems Review of Systems Defer to HPI    Physical Exam Triage Vital Signs ED Triage Vitals  Enc Vitals Group     BP 06/03/22 1413 (!) 141/94     Pulse Rate 06/03/22 1413 90     Resp 06/03/22 1413 17     Temp 06/03/22 1413 98.5 F (36.9 C)     Temp Source 06/03/22 1413 Oral     SpO2 06/03/22 1413 96 %     Weight --      Height --      Head Circumference --      Peak Flow --      Pain Score 06/03/22 1412 4      Pain Loc --      Pain Edu? --      Excl. in [place]? --    No data found.  Updated Vital Signs BP (!) 141/94 (BP Location: Right Arm)   Pulse 90   Temp 98.5 F (36.9 C) (Oral)   Resp 17   LMP 05/22/2022   SpO2 96%   Visual Acuity Right Eye Distance:   Left Eye Distance:   Bilateral Distance:    Right Eye Near:   Left Eye Near:    Bilateral Near:     Physical Exam Constitutional:      Appearance: Normal appearance.  HENT:     Head: Normocephalic.  Eyes:     Extraocular Movements: Extraocular movements intact.  Pulmonary:     Effort: Pulmonary effort is normal.  Musculoskeletal:     Comments: Left middle finger currently in splint wrapped with [place], limited movement, sensation intact, capillary refill less than 3  Neurological:     Mental Status: She is alert and oriented to person, place, and time. Mental status is at baseline.  Psychiatric:        Mood and Affect: Mood normal.        Behavior: Behavior normal.      UC Treatments / Results  Labs (all labs ordered are listed, but only abnormal results are displayed) Labs Reviewed - No data to display  EKG   Radiology No results found.  Procedures Procedures (including critical care time)  Medications Ordered in UC Medications - No data to display  Initial Impression / Assessment and Plan / UC Course  I have reviewed the triage vital signs and the nursing notes.  Pertinent labs & imaging results that were available during my care of the patient were reviewed by me and considered in my medical decision making (see chart for details).  Closed nondisplaced fracture of middle phalanx of finger of the left hand  Discussed capabilities of urgent care, given work note for 3 days, discussed patient reaching back out to orthopedic specialist and primary doctor for extended work note, occupational health referral placed, patient to follow-up with urgent care as needed Final Clinical Impressions(s) / UC  Diagnoses    Final diagnoses:  Closed nondisplaced fracture of middle phalanx of finger of left hand     Discharge Instructions      A referral has been placed for you for occupational health, if any further information is needed regarding your fracture they will need to reach out to your orthopedic doctor, they have been notified to do this as well, if you have not been reached out to within 1 week you may call the office to touch bases  Unfortunately as this is an urgent care I am only able to give out work notes for 3 days due to the FMLA process and legal allergies therefore you will need to touch bases with your orthopedic specialist or your primary doctor if you are needing an extended work note   ED Prescriptions   None    PDMP not reviewed this encounter.   Hans Eden, NP 06/03/22 1510

## 2022-06-03 NOTE — Discharge Instructions (Signed)
A referral has been placed for you for occupational health, if any further information is needed regarding your fracture they will need to reach out to your orthopedic doctor, they have been notified to do this as well, if you have not been reached out to within 1 week you may call the office to touch bases  Unfortunately as this is an urgent care I am only able to give out work notes for 3 days due to the FMLA process and legal allergies therefore you will need to touch bases with your orthopedic specialist or your primary doctor if you are needing an extended work note

## 2022-06-03 NOTE — ED Triage Notes (Signed)
Pt was wrote out of til today. 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 Pt needing note to be wrote out of work or will lose her job.

## 2022-06-16 NOTE — Therapy (Signed)
OUTPATIENT OCCUPATIONAL THERAPY ORTHO EVALUATION  Patient Name: Lauren Ramirez MRN: [id number] DOB:[birth year], 36 y.o., female Today's Date: 06/18/2022  PCP: none REFERRING PROVIDER: Lowella Petties, NP   OT End of Session - 06/17/22 [phone]     Visit Number 1    Number of Visits 9    Date for OT Re-Evaluation 08/26/22    Authorization Type Medicaid, [employer]    OT Start Time [phone]    OT Stop Time 1015    OT Time Calculation (min) 40 min             Past Medical History:  Diagnosis Date   Abnormal Pap smear    ASCUS 2011   Cro[facility]'s disease [facility])    ostomy 06/2019   General counseling and advice for contraceptive management 09/06/2014   History of preterm delivery, currently pregnant in first trimester 05/26/2018   32wk, 36wk, and 35 week twins.  17p   Nexplanon in place 09/06/2014   Past Surgical History:  Procedure Laterality Date   CESAREAN SECTION  04/14/2012   Procedure: CESAREAN SECTION;  Surgeon: Osborne Oman, MD;  Location: [facility];  Service: Gynecology;  Laterality: N/A;  primary   DILATION AND CURETTAGE OF UTERUS     HEMICOLECTOMY W/ OSTOMY  06/2019   done in [place]     Patient Active Problem List   Diagnosis Date Noted   Hypocalcemia 08/10/2019   Hypomagnesemia 08/10/2019   Hypokalemia 08/10/2019   IUFD at 2 weeks or more of gestation 09/07/2018   Exacerbation of Crohn's disease ([place]) 07/28/2018   History of preterm delivery, currently pregnant in second trimester 05/26/2018   History of cesarean section, low transverse 05/26/2018   History of Crohn's disease 05/26/2018    ONSET DATE: 05/30/22  REFERRING DIAG: L ring finger hyperextension sprain injury with small non displaced volar lip fx P2(middle phalanx) THERAPY DIAG:  Stiffness of left hand, not elsewhere classified - Plan: Ot plan of care cert/re-cert  Rationale for Evaluation and Treatment Rehabilitation  SUBJECTIVE:   SUBJECTIVE STATEMENT: To get the  use back in my hand Pt accompanied by: self  PERTINENT HISTORY: Pt injured finger playing football. She is being followed by  Dr. Grandville Silos at [facility] orthopedic.Sees MD again 07/04/22  PRECAUTIONS: Other: buddy straps, cleared for A/ROM  WEIGHT BEARING RESTRICTIONS Yes no heavy use of L hand, wear buddy straps  PAIN:  Are you having pain? Yes: NPRS scale: 4/10 Pain location: ring finger Pain description: aching Aggravating factors: exercise Relieving factors: rest  FALLS: Has patient fallen in last 6 months? No  LIVING ENVIRONMENT: Lives with: lives with their family    PLOF: Independent  PATIENT GOALS to get the use back in hand   OBJECTIVE:   HAND DOMINANCE: Right  ADLs: Overall ADLs: mod I with  Transfers/ambulation related to ADLs: Eating: mod I Grooming: mod I UB Dressing: mod I LB Dressing: mod I Toileting: mod I Bathing: difficulty with bathing, using RUE more Tub Shower transfers: mod I   FUNCTIONAL OUTCOME MEASURES: Quick Dash: 61.3%  UPPER EXTREMITY ROM     (Blank rows = not tested)  Active ROM Right eval Left eval  Thumb MCP (0-60)    Thumb IP (0-80)    Thumb Radial abd/add (0-55)     Thumb Palmar abd/add (0-45)     Thumb Opposition to Small Finger     Index MCP (0-90)     Index PIP (0-100)  Index DIP (0-70)      Long MCP (0-90)      Long PIP (0-100)      Long DIP (0-70)      Ring MCP (0-90)    65  Ring PIP (0-100)    80  Ring DIP (0-70)    45  Little MCP (0-90)      Little PIP (0-100)      Little DIP (0-70)      (Blank rows = not tested)    HAND FUNCTION: Grip strength: Right: NT lbs; Left: NT lbs- due to precautions  COORDINATION: Impaired due to precuations and ROM limitations  SENSATION: WFL  EDEMA: mild edema at middle joint of ring finger  COGNITION: Overall cognitive status: Within functional limits for tasks assessed      TODAY'S TREATMENT:  Pt was instructed in initial A/ROM HEP- see pt  instructions   PATIENT EDUCATION: Education details: A/ROM HEP Person educated: Patient Education method: Explanation Education comprehension: verbalized understanding, returned demonstration, and verbal cues required   HOME EXERCISE PROGRAM: 06/17/22-A/ROM HEP  GOALS:   SHORT TERM GOALS: Target date: 07/16/2022    I with initial HEP Baseline:dependent Goal status: INITIAL  2.  Pt will increase MP flexion by 15* for increased functional use. Baseline:MP flexion 65*  Goal status: INITIAL  3.   Pt will increase PIP flexion by 10* for increased functional use. Baseline: PIP flexion- 80* Goal status: INITIAL  4.    Pt will increase DIP flexion by 10* for increased functional use. Baseline: 45* Goal status: INITIAL  5.  I with buddy strap wear, and positioning Baseline: education provided day of eval, pt needs reinforcement Goal status: INITIAL    LONG TERM GOALS: Target date: 08/26/22  I with updated HEP Baseline: dependent Goal status: INITIAL  2.  Pt will resume use of LUE as a non dominant assist for ADLS at least 49% of the time with pain no greater than 2/10. Baseline: only able to use for very light tasks due to precautions, pain 4/10 Goal status: INITIAL  3.  Pt will demonstrate LUE grip strength of 30 lbs or greater for increased LUE functional use. Baseline: NT due to precautions Goal status: INITIAL  4. Pt will demonstrate improve Quick Dash score by 20% for improved LUE functional use. Baseline: 61.3% Goal status: initial   ASSESSMENT:  CLINICAL IMPRESSION: Patient is a 36 y.o. female who was seen today for occupational therapy evaluation for L ring finer hyperextension sprain injury with small non displaced volar lip fx P2. She was seen by Dr. Grandville Silos on 06/13/22 and cleared for ROM, buddy taping  PERFORMANCE DEFICITS in functional skills including ADLs, IADLs, coordination, dexterity, edema, ROM, strength, pain, flexibility, FMC, GMC, endurance,  decreased knowledge of precautions, decreased knowledge of use of DME, and UE functional use, cognitive skills including and psychosocial skills including coping strategies, environmental adaptation, habits, and routines and behaviors.   IMPAIRMENTS are limiting patient from ADLs, IADLs, rest and sleep, work, play, leisure, and social participation.   COMORBIDITIES may have co-morbidities  that affects occupational performance. Patient will benefit from skilled OT to address above impairments and improve overall function.  MODIFICATION OR ASSISTANCE TO COMPLETE EVALUATION: No modification of tasks or assist necessary to complete an evaluation.  OT OCCUPATIONAL PROFILE AND HISTORY: Problem focused assessment: Including review of records relating to presenting problem.  CLINICAL DECISION MAKING: LOW - limited treatment options, no task modification necessary  REHAB POTENTIAL: Good  EVALUATION COMPLEXITY:  Low      PLAN: OT FREQUENCY: 1x/week  OT DURATION: 8 weeks plus eval  PLANNED INTERVENTIONS: self care/ADL training, therapeutic exercise, therapeutic activity, neuromuscular re-education, manual therapy, scar mobilization, passive range of motion, splinting, electrical stimulation, ultrasound, paraffin, fluidotherapy, moist heat, contrast bath, patient/family education, energy conservation, coping strategies training, and DME and/or AE instructions  RECOMMENDED OTHER SERVICES: n/a  CONSULTED AND AGREED WITH PLAN OF CARE: Patient  PLAN FOR NEXT SESSION: review HEP, fluidotherapy send note with pt. To MD to see when she can perform P/ROM, and strengthening    Managed medicaid CPT codes: [phone] - OT Re-evaluation, 97110- Therapeutic Exercise, [phone]- Neuro Re-education, [phone] - Manual Therapy, 97530 - Therapeutic Activities, [phone] - Self Care, [phone] - Electrical stimulation (unattended), [id number] - Ultrasound, [id number] - Orthotic Fit, [id number] - Fluidotherapy, and [id number] -  Paraffin    Quick  dash: 61.3%  Shahir Karen, OT 06/18/2022, 9:44 AM

## 2022-06-17 ENCOUNTER — Ambulatory Visit: Payer: BC Managed Care – PPO | Attending: Emergency Medicine | Admitting: Occupational Therapy

## 2022-06-17 DIAGNOSIS — M79642 Pain in left hand: Secondary | ICD-10-CM

## 2022-06-17 DIAGNOSIS — Y939 Activity, unspecified: Secondary | ICD-10-CM | POA: Insufficient documentation

## 2022-06-17 DIAGNOSIS — X58XXXA Exposure to other specified factors, initial encounter: Secondary | ICD-10-CM | POA: Insufficient documentation

## 2022-06-17 DIAGNOSIS — R600 Localized edema: Secondary | ICD-10-CM

## 2022-06-17 DIAGNOSIS — S63615A Unspecified sprain of left ring finger, initial encounter: Secondary | ICD-10-CM | POA: Insufficient documentation

## 2022-06-17 DIAGNOSIS — M25642 Stiffness of left hand, not elsewhere classified: Secondary | ICD-10-CM | POA: Diagnosis not present

## 2022-06-17 DIAGNOSIS — M6281 Muscle weakness (generalized): Secondary | ICD-10-CM

## 2022-06-17 DIAGNOSIS — Y929 Unspecified place or not applicable: Secondary | ICD-10-CM | POA: Insufficient documentation

## 2022-06-17 NOTE — Patient Instructions (Signed)
MP Flexion (Active Isolated)  AROM: PIP Flexion / Extension   Pinch bottom knuckle of ___ring_____ finger of hand to prevent bending. Actively bend middle knuckle until stretch is felt. Hold __5__ seconds. Relax. Straighten finger as far as possible. Repeat __10-15__ times per set. Do _4-6___ sessions per day.   AROM: DIP Flexion / Extension   Pinch middle knuckle of ___ring_____ finger of  hand to prevent bending. Bend end knuckle until stretch is felt. Hold _5___ seconds. Relax. Straighten finger as far as possible. Repeat _10-15___ times per set.  Do _4-6___ sessions per day.  AROM: Finger Flexion / Extension   Actively bend fingers of  hand. Start with knuckles furthest from palm, and slowly make a fist. Hold __5__ seconds. Relax. Then straighten fingers as far as possible. Repeat _10-15___ times per set.  Do _4-6___ sessions per day.  Copyright  VHI. All rights reserved.   Flexor Tendon Gliding (Active Hook Fist)   With fingers and knuckles straight, bend middle and tip joints. Do not bend large knuckles. Repeat _10-15___ times. Do _4-6___ sessions per day.  MP Flexion (Active)   With back of hand on table, bend large knuckles as far as they will go, keeping small joints straight. Repeat _10-15___ times. Do __4-6__ sessions per day. Activity: Reach into a narrow container.*      Finger Flexion / Extension   With palm up, bend fingers of left hand toward palm, making a  fist. Straighten fingers, opening fist. Repeat sequence _10-15___ times per session. Do _4-6__ sessions per day. Hand Variation: Palm down   Copyright  VHI. All rights reserved.

## 2022-06-20 ENCOUNTER — Ambulatory Visit: Payer: Medicaid Other | Admitting: Physician Assistant

## 2022-06-21 ENCOUNTER — Encounter: Payer: Self-pay | Admitting: Occupational Therapy

## 2022-06-24 ENCOUNTER — Ambulatory Visit: Payer: BC Managed Care – PPO | Admitting: Occupational Therapy

## 2022-07-02 ENCOUNTER — Ambulatory Visit: Payer: Medicaid Other | Admitting: Occupational Therapy

## 2022-07-08 ENCOUNTER — Ambulatory Visit: Payer: Medicaid Other | Admitting: Occupational Therapy

## 2022-07-10 ENCOUNTER — Ambulatory Visit: Payer: Medicaid Other | Admitting: Occupational Therapy

## 2022-07-10 NOTE — Therapy (Deleted)
OUTPATIENT OCCUPATIONAL THERAPY ORTHO EVALUATION  Patient Name: Lauren Ramirez MRN: [id number] DOB:[birth year], 36 y.o., female Today's Date: 07/10/2022  PCP: none REFERRING PROVIDER: Lowella Petties, NP     Past Medical History:  Diagnosis Date   Abnormal Pap smear    ASCUS 2011   Crohn's disease [facility])    ostomy 06/2019   General counseling and advice for contraceptive management 09/06/2014   History of preterm delivery, currently pregnant in first trimester 05/26/2018   32wk, 36wk, and 35 week twins.  17p   Nexplanon in place 09/06/2014   Past Surgical History:  Procedure Laterality Date   CESAREAN SECTION  04/14/2012   Procedure: CESAREAN SECTION;  Surgeon: Osborne Oman, MD;  Location: [facility];  Service: Gynecology;  Laterality: N/A;  primary   DILATION AND CURETTAGE OF UTERUS     HEMICOLECTOMY W/ OSTOMY  06/2019   done in [place]     Patient Active Problem List   Diagnosis Date Noted   Hypocalcemia 08/10/2019   Hypomagnesemia 08/10/2019   Hypokalemia 08/10/2019   IUFD at 37 weeks or more of gestation 09/07/2018   Exacerbation of Crohn's disease ([place]) 07/28/2018   History of preterm delivery, currently pregnant in second trimester 05/26/2018   History of cesarean section, low transverse 05/26/2018   History of Crohn's disease 05/26/2018    ONSET DATE: 05/30/22  REFERRING DIAG: L ring finger hyperextension sprain injury with small non displaced volar lip fx P2(middle phalanx) THERAPY DIAG:  No diagnosis found.  Rationale for Evaluation and Treatment Rehabilitation  SUBJECTIVE:   SUBJECTIVE STATEMENT: To get the use back in my hand Pt accompanied by: self  PERTINENT HISTORY: Pt injured finger playing football. She is being followed by  Dr. Grandville Silos at [facility] orthopedic.Sees MD again 07/04/22  PRECAUTIONS: Other: buddy straps, cleared for A/ROM  WEIGHT BEARING RESTRICTIONS Yes no heavy use of L hand, wear buddy straps  PAIN:   Are you having pain? Yes: NPRS scale: 4/10 Pain location: ring finger Pain description: aching Aggravating factors: exercise Relieving factors: rest  FALLS: Has patient fallen in last 6 months? No  LIVING ENVIRONMENT: Lives with: lives with their family    PLOF: Independent  PATIENT GOALS to get the use back in hand   OBJECTIVE:   HAND DOMINANCE: Right  ADLs: Overall ADLs: mod I with  Transfers/ambulation related to ADLs: Eating: mod I Grooming: mod I UB Dressing: mod I LB Dressing: mod I Toileting: mod I Bathing: difficulty with bathing, using RUE more Tub Shower transfers: mod I   FUNCTIONAL OUTCOME MEASURES: Quick Dash: 61.3%  UPPER EXTREMITY ROM     (Blank rows = not tested)  Active ROM Right eval Left eval  Thumb MCP (0-60)    Thumb IP (0-80)    Thumb Radial abd/add (0-55)     Thumb Palmar abd/add (0-45)     Thumb Opposition to Small Finger     Index MCP (0-90)     Index PIP (0-100)     Index DIP (0-70)      Long MCP (0-90)      Long PIP (0-100)      Long DIP (0-70)      Ring MCP (0-90)    65  Ring PIP (0-100)    80  Ring DIP (0-70)    45  Little MCP (0-90)      Little PIP (0-100)      Little DIP (0-70)      (Blank  rows = not tested)    HAND FUNCTION: Grip strength: Right: NT lbs; Left: NT lbs- due to precautions  COORDINATION: Impaired due to precuations and ROM limitations  SENSATION: WFL  EDEMA: mild edema at middle joint of ring finger  COGNITION: Overall cognitive status: Within functional limits for tasks assessed      TODAY'S TREATMENT:  Pt was instructed in initial A/ROM HEP- see pt instructions   PATIENT EDUCATION: Education details: A/ROM HEP Person educated: Patient Education method: Explanation Education comprehension: verbalized understanding, returned demonstration, and verbal cues required   HOME EXERCISE PROGRAM: 06/17/22-A/ROM HEP  GOALS:   SHORT TERM GOALS: Target date: 08/07/2022    I with  initial HEP Baseline:dependent Goal status: INITIAL  2.  Pt will increase MP flexion by 15* for increased functional use. Baseline:MP flexion 65*  Goal status: INITIAL  3.   Pt will increase PIP flexion by 10* for increased functional use. Baseline: PIP flexion- 80* Goal status: INITIAL  4.    Pt will increase DIP flexion by 10* for increased functional use. Baseline: 45* Goal status: INITIAL  5.  I with buddy strap wear, and positioning Baseline: education provided day of eval, pt needs reinforcement Goal status: INITIAL    LONG TERM GOALS: Target date: 08/26/22  I with updated HEP Baseline: dependent Goal status: INITIAL  2.  Pt will resume use of LUE as a non dominant assist for ADLS at least 18% of the time with pain no greater than 2/10. Baseline: only able to use for very light tasks due to precautions, pain 4/10 Goal status: INITIAL  3.  Pt will demonstrate LUE grip strength of 30 lbs or greater for increased LUE functional use. Baseline: NT due to precautions Goal status: INITIAL  4. Pt will demonstrate improve Quick Dash score by 20% for improved LUE functional use. Baseline: 61.3% Goal status: initial   ASSESSMENT:  CLINICAL IMPRESSION: Patient is a 36 y.o. female who was seen today for occupational therapy evaluation for L ring finer hyperextension sprain injury with small non displaced volar lip fx P2. She was seen by Dr. Grandville Silos on 06/13/22 and cleared for ROM, buddy taping  PERFORMANCE DEFICITS in functional skills including ADLs, IADLs, coordination, dexterity, edema, ROM, strength, pain, flexibility, FMC, GMC, endurance, decreased knowledge of precautions, decreased knowledge of use of DME, and UE functional use, cognitive skills including and psychosocial skills including coping strategies, environmental adaptation, habits, and routines and behaviors.   IMPAIRMENTS are limiting patient from ADLs, IADLs, rest and sleep, work, play, leisure, and social  participation.   COMORBIDITIES may have co-morbidities  that affects occupational performance. Patient will benefit from skilled OT to address above impairments and improve overall function.  MODIFICATION OR ASSISTANCE TO COMPLETE EVALUATION: No modification of tasks or assist necessary to complete an evaluation.  OT OCCUPATIONAL PROFILE AND HISTORY: Problem focused assessment: Including review of records relating to presenting problem.  CLINICAL DECISION MAKING: LOW - limited treatment options, no task modification necessary  REHAB POTENTIAL: Good  EVALUATION COMPLEXITY: Low      PLAN: OT FREQUENCY: 1x/week  OT DURATION: 8 weeks plus eval  PLANNED INTERVENTIONS: self care/ADL training, therapeutic exercise, therapeutic activity, neuromuscular re-education, manual therapy, scar mobilization, passive range of motion, splinting, electrical stimulation, ultrasound, paraffin, fluidotherapy, moist heat, contrast bath, patient/family education, energy conservation, coping strategies training, and DME and/or AE instructions  RECOMMENDED OTHER SERVICES: n/a  CONSULTED AND AGREED WITH PLAN OF CARE: Patient  PLAN FOR NEXT SESSION: review HEP, fluidotherapy  send note with pt. To MD to see when she can perform P/ROM, and strengthening    Managed medicaid CPT codes: [phone] - OT Re-evaluation, 97110- Therapeutic Exercise, [phone]- Neuro Re-education, 97140 - Manual Therapy, 97530 - Therapeutic Activities, [phone] - Self Care, [phone] - Electrical stimulation (unattended), [id number] - Ultrasound, [id number] - Orthotic Fit, [id number] - Fluidotherapy, and [id number] -  Paraffin    Quick dash: 61.3%  Milca Sytsma, OT 07/10/2022, 7:29 AM

## 2022-07-15 ENCOUNTER — Encounter: Payer: Medicaid Other | Admitting: Occupational Therapy

## 2022-07-23 ENCOUNTER — Encounter: Payer: Medicaid Other | Admitting: Occupational Therapy

## 2022-07-24 DIAGNOSIS — E538 Deficiency of other specified B group vitamins: Secondary | ICD-10-CM | POA: Diagnosis not present

## 2022-07-30 ENCOUNTER — Encounter: Payer: Medicaid Other | Admitting: Occupational Therapy

## 2022-08-06 ENCOUNTER — Encounter: Payer: Medicaid Other | Admitting: Occupational Therapy

## 2022-08-13 ENCOUNTER — Encounter: Payer: Medicaid Other | Admitting: Occupational Therapy

## 2022-08-22 ENCOUNTER — Emergency Department (HOSPITAL_COMMUNITY)
Admission: EM | Admit: 2022-08-22 | Discharge: 2022-08-22 | Discharge status: Against Medical Advice | Payer: Medicaid Other | Attending: Emergency Medicine | Admitting: Emergency Medicine

## 2022-08-22 ENCOUNTER — Encounter (HOSPITAL_COMMUNITY): Payer: Self-pay

## 2022-08-22 ENCOUNTER — Other Ambulatory Visit: Payer: Self-pay

## 2022-08-22 DIAGNOSIS — R799 Abnormal finding of blood chemistry, unspecified: Secondary | ICD-10-CM | POA: Insufficient documentation

## 2022-08-22 DIAGNOSIS — M79641 Pain in right hand: Secondary | ICD-10-CM | POA: Diagnosis not present

## 2022-08-22 DIAGNOSIS — M79642 Pain in left hand: Secondary | ICD-10-CM | POA: Insufficient documentation

## 2022-08-22 DIAGNOSIS — Z5321 Procedure and treatment not carried out due to patient leaving prior to being seen by health care provider: Secondary | ICD-10-CM | POA: Insufficient documentation

## 2022-08-22 DIAGNOSIS — R9431 Abnormal electrocardiogram [ECG] [EKG]: Secondary | ICD-10-CM | POA: Diagnosis not present

## 2022-08-22 LAB — I-STAT CHEM 8, ED
BUN: 3 mg/dL — ABNORMAL LOW (ref 6–20)
Calcium, Ion: 1.09 mmol/L — ABNORMAL LOW (ref 1.15–1.40)
Chloride: 104 mmol/L (ref 98–111)
Creatinine, Ser: 0.7 mg/dL (ref 0.44–1.00)
Glucose, Bld: 91 mg/dL (ref 70–99)
HCT: 40 % (ref 36.0–46.0)
Hemoglobin: 13.6 g/dL (ref 12.0–15.0)
Potassium: 3.8 mmol/L (ref 3.5–5.1)
Sodium: 140 mmol/L (ref 135–145)
TCO2: 26 mmol/L (ref 22–32)

## 2022-08-22 LAB — CBC WITH DIFFERENTIAL/PLATELET
Abs Immature Granulocytes: 0.02 10*3/uL (ref 0.00–0.07)
Basophils Absolute: 0 10*3/uL (ref 0.0–0.1)
Basophils Relative: 0 %
Eosinophils Absolute: 0.2 10*3/uL (ref 0.0–0.5)
Eosinophils Relative: 3 %
HCT: 38.9 % (ref 36.0–46.0)
Hemoglobin: 12.8 g/dL (ref 12.0–15.0)
Immature Granulocytes: 0 %
Lymphocytes Relative: 30 %
Lymphs Abs: 2.1 10*3/uL (ref 0.7–4.0)
MCH: 29.2 pg (ref 26.0–34.0)
MCHC: 32.9 g/dL (ref 30.0–36.0)
MCV: 88.8 fL (ref 80.0–100.0)
Monocytes Absolute: 0.4 10*3/uL (ref 0.1–1.0)
Monocytes Relative: 5 %
Neutro Abs: 4.3 10*3/uL (ref 1.7–7.7)
Neutrophils Relative %: 62 %
Platelets: 407 10*3/uL — ABNORMAL HIGH (ref 150–400)
RBC: 4.38 MIL/uL (ref 3.87–5.11)
RDW: 12.2 % (ref 11.5–15.5)
WBC: 7 10*3/uL (ref 4.0–10.5)
nRBC: 0 % (ref 0.0–0.2)

## 2022-08-22 LAB — MAGNESIUM: Magnesium: 1.4 mg/dL — ABNORMAL LOW (ref 1.7–2.4)

## 2022-08-22 NOTE — ED Triage Notes (Signed)
Patient has a hx of low mag and low K due to crohns.  Patients fingers are twitching and pins and needle feeling in arms and legs.

## 2022-08-22 NOTE — ED Provider Triage Note (Signed)
Emergency Medicine Provider Triage Evaluation Note  Lauren Ramirez , Ramirez 36 y.o. female  was evaluated in triage.  Pt complains of cramping and twitching noted to bilateral hands.  Has Ramirez history of low magnesium and potassium due to history of Crohn's.  No meds tried prior to arrival. Denies chest pain, shortness of breath, abdominal pain, nausea, vomiting.  Patient does not take supplementation for magnesium or potassium.  Is supposed to take supplementation for iron and vitamin D.  Review of Systems  Positive:  Negative:   Physical Exam  BP (!) 151/105 (BP Location: Right Arm)   Pulse (!) 109   Temp 98.3 F (36.8 C) (Oral)   Resp 20   Ht 5' 6"  (1.676 m)   Wt 104.8 kg   SpO2 98%   BMI 37.28 kg/m  Gen:   Awake, no distress   Resp:  Normal effort  MSK:   Moves extremities without difficulty  Other:  Twitching noted to bilateral hands.  Medical Decision Making  Medically screening exam initiated at 2:07 PM.  Appropriate orders placed.  Lauren Ramirez was informed that the remainder of the evaluation will be completed by another provider, this initial triage assessment does not replace that evaluation, and the importance of remaining in the ED until their evaluation is complete.  Work-up initiated   Lauren Gambone A, PA-C 08/22/22 1408

## 2022-08-23 DIAGNOSIS — E538 Deficiency of other specified B group vitamins: Secondary | ICD-10-CM | POA: Diagnosis not present

## 2022-08-23 DIAGNOSIS — K5000 Crohn's disease of small intestine without complications: Secondary | ICD-10-CM | POA: Diagnosis not present

## 2022-10-18 DIAGNOSIS — E538 Deficiency of other specified B group vitamins: Secondary | ICD-10-CM | POA: Diagnosis not present

## 2022-12-30 DIAGNOSIS — E538 Deficiency of other specified B group vitamins: Secondary | ICD-10-CM | POA: Diagnosis not present

## 2023-01-03 ENCOUNTER — Other Ambulatory Visit: Payer: Self-pay

## 2023-01-03 ENCOUNTER — Emergency Department (HOSPITAL_COMMUNITY)
Admission: EM | Admit: 2023-01-03 | Discharge: 2023-01-03 | Disposition: A | Discharge status: Home / Self Care | Payer: No Typology Code available for payment source | Attending: Emergency Medicine | Admitting: Emergency Medicine

## 2023-01-03 DIAGNOSIS — R55 Syncope and collapse: Secondary | ICD-10-CM | POA: Diagnosis not present

## 2023-01-03 DIAGNOSIS — R42 Dizziness and giddiness: Secondary | ICD-10-CM | POA: Diagnosis present

## 2023-01-03 LAB — BASIC METABOLIC PANEL
Anion gap: 10 (ref 5–15)
BUN: 5 mg/dL — ABNORMAL LOW (ref 6–20)
CO2: 20 mmol/L — ABNORMAL LOW (ref 22–32)
Calcium: 8.8 mg/dL — ABNORMAL LOW (ref 8.9–10.3)
Chloride: 104 mmol/L (ref 98–111)
Creatinine, Ser: 0.75 mg/dL (ref 0.44–1.00)
GFR, Estimated: >60 mL/min (ref >60)
Glucose, Bld: 106 mg/dL — ABNORMAL HIGH (ref 70–99)
Potassium: 3.5 mmol/L (ref 3.5–5.1)
Sodium: 134 mmol/L — ABNORMAL LOW (ref 135–145)

## 2023-01-03 LAB — CBC WITH DIFFERENTIAL/PLATELET
Abs Immature Granulocytes: 0.01 10*3/uL (ref 0.00–0.07)
Basophils Absolute: 0 10*3/uL (ref 0.0–0.1)
Basophils Relative: 1 %
Eosinophils Absolute: 0.2 10*3/uL (ref 0.0–0.5)
Eosinophils Relative: 3 %
HCT: 38.7 % (ref 36.0–46.0)
Hemoglobin: 13.2 g/dL (ref 12.0–15.0)
Immature Granulocytes: 0 %
Lymphocytes Relative: 25 %
Lymphs Abs: 1.7 10*3/uL (ref 0.7–4.0)
MCH: 29.2 pg (ref 26.0–34.0)
MCHC: 34.1 g/dL (ref 30.0–36.0)
MCV: 85.6 fL (ref 80.0–100.0)
Monocytes Absolute: 0.4 10*3/uL (ref 0.1–1.0)
Monocytes Relative: 6 %
Neutro Abs: 4.3 10*3/uL (ref 1.7–7.7)
Neutrophils Relative %: 65 %
Platelets: 425 10*3/uL — ABNORMAL HIGH (ref 150–400)
RBC: 4.52 MIL/uL (ref 3.87–5.11)
RDW: 12.2 % (ref 11.5–15.5)
WBC: 6.6 10*3/uL (ref 4.0–10.5)
nRBC: 0 % (ref 0.0–0.2)

## 2023-01-03 LAB — I-STAT BETA HCG BLOOD, ED (MC, WL, AP ONLY): I-stat hCG, quantitative: <5 m[IU]/mL (ref <5)

## 2023-01-03 LAB — CBG MONITORING, ED: Glucose-Capillary: 92 mg/dL (ref 70–99)

## 2023-01-03 MED ORDER — LACTATED RINGERS IV BOLUS
1000.0000 mL | Freq: Once | INTRAVENOUS | Status: AC
  Administered 2023-01-03: 1000 mL via INTRAVENOUS

## 2023-01-03 NOTE — ED Provider Notes (Signed)
[place] Provider Note   CSN: [id number] Arrival date & time: 01/03/23  1736     History Chief Complaint  Patient presents with   Dizziness    Lightheaded    HPI Lauren Ramirez is a 37 y.o. female presenting for chief complaint of presyncope.  States that she was outside walking to the car when she felt sudden onset lightheadedness and presyncope.  Endorses a prodrome of abdominal cramping.  Endorses a history of Crohn's disease which is currently in remission.  States she has not been drinking much water and has frequent diarrhea.  Denies any worsening of her abdominal pain.  Symptoms have grossly resolved at this time.  She denies fevers or chills nausea or vomiting syncope or shortness of breath.  Otherwise ambulatory tolerating p.o. intake at this time. Patient's recorded medical, surgical, social, medication list and allergies were reviewed in the Snapshot window as part of the initial history.   Review of Systems   Review of Systems  Constitutional:  Negative for chills and fever.  HENT:  Negative for ear pain and sore throat.   Eyes:  Negative for pain and visual disturbance.  Respiratory:  Negative for cough and shortness of breath.   Cardiovascular:  Negative for chest pain and palpitations.  Gastrointestinal:  Negative for abdominal pain and vomiting.  Genitourinary:  Negative for dysuria and hematuria.  Musculoskeletal:  Negative for arthralgias and back pain.  Skin:  Negative for color change and rash.  Neurological:  Positive for light-headedness. Negative for seizures and syncope.  All other systems reviewed and are negative.   Physical Exam Updated Vital Signs BP 106/74   Pulse 80   Temp 98.1 F (36.7 C)   Resp (!) 25   Ht 5\' 6"  (1.676 m)   Wt 106.6 kg   LMP 12/10/2022   SpO2 100%   BMI 37.93 kg/m  Physical Exam Vitals and nursing note reviewed.  Constitutional:      General: She is not in acute  distress.    Appearance: She is well-developed.  HENT:     Head: Normocephalic and atraumatic.  Eyes:     Conjunctiva/sclera: Conjunctivae normal.  Cardiovascular:     Rate and Rhythm: Normal rate and regular rhythm.     Heart sounds: No murmur heard. Pulmonary:     Effort: Pulmonary effort is normal. No respiratory distress.     Breath sounds: Normal breath sounds.  Abdominal:     General: There is no distension.     Palpations: Abdomen is soft.     Tenderness: There is no abdominal tenderness. There is no right CVA tenderness or left CVA tenderness.  Musculoskeletal:        General: No swelling or tenderness. Normal range of motion.     Cervical back: Neck supple.  Skin:    General: Skin is warm and dry.  Neurological:     General: No focal deficit present.     Mental Status: She is alert and oriented to person, place, and time. Mental status is at baseline.     Cranial Nerves: No cranial nerve deficit.      ED Course/ Medical Decision Making/ A&P    Procedures Procedures   Medications Ordered in ED Medications  lactated ringers bolus 1,000 mL (0 mLs Intravenous Stopped 01/03/23 2121)   Medical Decision Making:   Lauren Ramirez is a 37 y.o. female who presented to the ED today with a near-syncopal  episode detailed above.    Additional history discussed with patient's family/caregivers.  Patient's presentation is complicated by their history of chron's disease.  Patient placed on continuous vitals and telemetry monitoring while in ED which was reviewed periodically.  Complete initial physical exam performed, notably the patient  was HDS in NAD.    Reviewed and confirmed nursing documentation for past medical history, family history, social history.    Initial Assessment:   With the patient's presentation of near-syncope, most likely diagnosis is orthostatic hypotension vs vasovagal episode. Other diagnoses were considered including (but not limited to)  arrythmogenic syncope, valvular abnormality, PE, aortic dissection. These are considered less likely due to history of present illness and physical exam findings.   This is most consistent with an acute life/limb threatening illness complicated by underlying chronic conditions. In particular, concerning cardiac etiology, this is less likely to be the etiology given the lack of chest pain, lack of serious comorbidities includingheart failure orCAD.   Additionally, patient's history appears more consistent with benign episodes including orthostatic remains vagal episode based on history of diarrheal illness, poor p.o. intake leading into a prodrome of lightheadedness. Initial Plan:  Screening labs including CBC and Metabolic panel to evaluate for infectious or metabolic etiology of disease.  Objective evaluation as below reviewed after administration of IVF/Telemetry monitoring  Initial Study Results:   Laboratory  All laboratory results reviewed without evidence of clinically relevant pathology.   EKG EKG was reviewed independently. Rate, rhythm, axis, intervals all examined and without medically relevant abnormality. ST segments without concerns for elevations.   Final Assessment and Plan:   Patient observed in emergency room for a total of 4 hours.  No further episodes of near syncope.  Heart rate has improved from 140s to 80s after IV fluids and patient is ambulatory tolerating p.o. intake.  Had a prolonged shared medical decision making conversation with patient.  Given her near syncope event resolving with IV fluids and overall clinical history being significant for orthostasis and dehydration, she would like to proceed with outpatient rehydration and plan to follow-up with primary care fighter in 48 hours for repeat evaluation.  Otherwise patient clinically stable for outpatient care management at this time without acute indication for further emergency department intervention.  Disposition:   I have considered need for hospitalization, however, considering all of the above, I believe this patient is stable for discharge at this time.  Patient/family educated about specific return precautions for given chief complaint and symptoms.  Patient/family educated about follow-up with PCP .     Can patient/family expressed understanding of return precautions and need for follow-up. Patient spoken to regarding all imaging and laboratory results and appropriate follow up for these results. All education provided in verbal form with additional information in written form. Time was allowed for answering of patient questions. Patient discharged.    Emergency Department Medication Summary:   Medications  lactated ringers bolus 1,000 mL (0 mLs Intravenous Stopped 01/03/23 2121)          Clinical Impression:  1. Near syncope     Discharge   Final Clinical Impression(s) / ED Diagnoses Final diagnoses:  Near syncope    Rx / DC Orders ED Discharge Orders     None         Tretha Sciara, MD 01/03/23 2137

## 2023-01-03 NOTE — ED Provider Triage Note (Signed)
Emergency Medicine Provider Triage Evaluation Note  Lauren Ramirez , a 37 y.o. female  was evaluated in triage.  Pt complains of fast heart rate and feeling dizzy and shaky.  Patient she was feeling fine all day was walking out of work and started feeling somewhat lightheaded and feeling shaky all over and had to sit down.  Denies shortness of breath or chest pain, no fevers or chills, no nausea or vomiting.  She has history of Crohn's disease, denies any symptoms of this, she states that the medication takes his B12 injections for deficiency. He does note that she has been under a lot of stress lately and thinks it may be related to anxiety She denies any lower extremity swelling or pain.  Review of Systems  Positive: Dizziness, palpitations Negative: sob  Physical Exam  BP (!) 139/97 (BP Location: Right Arm)   Pulse (!) 111   Temp 98.1 F (36.7 C)   Resp 19   SpO2 98%  Gen:   Awake, no distress   Resp:  Normal effort  MSK:   Moves extremities without difficulty  Other:    Medical Decision Making  Medically screening exam initiated at 6:05 PM.  Appropriate orders placed.  [name] was informed that the remainder of the evaluation will be completed by another provider, this initial triage assessment does not replace that evaluation, and the importance of remaining in the ED until their evaluation is complete.     Gwenevere Abbot, [place] 01/03/23 [phone]

## 2023-01-03 NOTE — ED Triage Notes (Signed)
Pt presents with sudden onset of lightheadedness. As pt was discussing her symptom, pt became extremely anxious and started taking things off her and wanted to pull off her cloths. Pt's HR noted to increase to 140. After talking with pt HR came down to 115. Pt does state she has been under a lot of stress.

## 2023-03-10 DIAGNOSIS — E538 Deficiency of other specified B group vitamins: Secondary | ICD-10-CM | POA: Diagnosis not present

## 2023-04-07 DIAGNOSIS — E538 Deficiency of other specified B group vitamins: Secondary | ICD-10-CM | POA: Diagnosis not present

## 2023-05-08 DIAGNOSIS — E538 Deficiency of other specified B group vitamins: Secondary | ICD-10-CM | POA: Diagnosis not present

## 2023-07-07 DIAGNOSIS — E538 Deficiency of other specified B group vitamins: Secondary | ICD-10-CM | POA: Diagnosis not present

## 2023-07-21 DIAGNOSIS — K5000 Crohn's disease of small intestine without complications: Secondary | ICD-10-CM | POA: Diagnosis not present

## 2023-07-21 DIAGNOSIS — E611 Iron deficiency: Secondary | ICD-10-CM | POA: Diagnosis not present

## 2023-07-21 DIAGNOSIS — E538 Deficiency of other specified B group vitamins: Secondary | ICD-10-CM | POA: Diagnosis not present

## 2023-07-21 DIAGNOSIS — E559 Vitamin D deficiency, unspecified: Secondary | ICD-10-CM | POA: Diagnosis not present

## 2023-08-08 DIAGNOSIS — K5000 Crohn's disease of small intestine without complications: Secondary | ICD-10-CM | POA: Diagnosis not present

## 2023-08-08 DIAGNOSIS — E611 Iron deficiency: Secondary | ICD-10-CM | POA: Diagnosis not present

## 2023-08-13 ENCOUNTER — Telehealth: Payer: Self-pay

## 2023-08-13 NOTE — Telephone Encounter (Signed)
  Medicaid Managed Care   Unsuccessful Outreach Note  08/13/2023 Name: Lauren Ramirez MRN: [id number] DOB: [birth year]  Referred by: Pcp, No Reason for referral : No chief complaint on file.   Spoke with patient who declined to schedule an appointment with RN Manager at this time. Patient given my contact information should she decide to schedule in the future.   Follow Up Plan:  Patient will call back if she decides to schedule an appointment.  Nicholes Rough, CMA Care Guide [employer]

## 2023-10-09 ENCOUNTER — Ambulatory Visit: Payer: Medicaid Other | Admitting: Adult Health

## 2023-10-09 ENCOUNTER — Encounter: Payer: Self-pay | Admitting: Adult Health

## 2023-10-09 VITALS — BP 136/95 | HR 97 | Ht 66.0 in | Wt 227.5 lb

## 2023-10-09 DIAGNOSIS — O3680X0 Pregnancy with inconclusive fetal viability, not applicable or unspecified: Secondary | ICD-10-CM | POA: Insufficient documentation

## 2023-10-09 DIAGNOSIS — R110 Nausea: Secondary | ICD-10-CM | POA: Insufficient documentation

## 2023-10-09 DIAGNOSIS — Z8751 Personal history of pre-term labor: Secondary | ICD-10-CM | POA: Diagnosis not present

## 2023-10-09 DIAGNOSIS — Z3A01 Less than 8 weeks gestation of pregnancy: Secondary | ICD-10-CM | POA: Diagnosis not present

## 2023-10-09 DIAGNOSIS — O09521 Supervision of elderly multigravida, first trimester: Secondary | ICD-10-CM | POA: Diagnosis not present

## 2023-10-09 DIAGNOSIS — Z98891 History of uterine scar from previous surgery: Secondary | ICD-10-CM | POA: Diagnosis not present

## 2023-10-09 DIAGNOSIS — O09529 Supervision of elderly multigravida, unspecified trimester: Secondary | ICD-10-CM | POA: Insufficient documentation

## 2023-10-09 DIAGNOSIS — Z8719 Personal history of other diseases of the digestive system: Secondary | ICD-10-CM | POA: Diagnosis not present

## 2023-10-09 DIAGNOSIS — Z3201 Encounter for pregnancy test, result positive: Secondary | ICD-10-CM | POA: Insufficient documentation

## 2023-10-09 DIAGNOSIS — R030 Elevated blood-pressure reading, without diagnosis of hypertension: Secondary | ICD-10-CM | POA: Insufficient documentation

## 2023-10-09 DIAGNOSIS — O364XX0 Maternal care for intrauterine death, not applicable or unspecified: Secondary | ICD-10-CM | POA: Diagnosis not present

## 2023-10-09 DIAGNOSIS — Z1332 Encounter for screening for maternal depression: Secondary | ICD-10-CM | POA: Diagnosis not present

## 2023-10-09 LAB — POCT URINE PREGNANCY: Preg Test, Ur: POSITIVE — AB

## 2023-10-09 MED ORDER — ONDANSETRON HCL 4 MG PO TABS: 4.0000 mg | ORAL_TABLET | Freq: Three times a day (TID) | ORAL | 1 refills | Status: DC | PRN

## 2023-10-09 MED ORDER — PRENATAL PLUS 27-1 MG PO TABS: 1.0000 | ORAL_TABLET | Freq: Every day | ORAL | 12 refills | Status: DC

## 2023-10-09 NOTE — Progress Notes (Signed)
Subjective:     Patient ID: Lauren Ramirez, female   DOB: May 12, 1986, 37 y.o.   MRN: [id number]  HPI Lauren Ramirez is a 37 year old black female, with SO, [id number], in for confirmation of pregnancy, has missed a period and had 1+HPT. She has some nausea too.  Review of Systems +missed period, with +HPTs +nausea No spotting Reviewed past medical,surgical, social and family history. Reviewed medications and allergies.     Objective:   Physical Exam BP (!) 136/95 (BP Location: Right Arm, Patient Position: Sitting, Cuff Size: Normal)   Pulse 97   Ht 5\' 6"  (1.676 m)   Wt 227 lb 8 oz (103.2 kg)   LMP 08/27/2023   BMI 36.72 kg/m  UPT +, about 6+1 weeks by LPM with EDD 06/02/24. Skin warm and dry. Neck: mid line trachea, normal thyroid, good ROM, no lymphadenopathy noted. Lungs: clear to ausculation bilaterally. Cardiovascular: regular rate and rhythm. Abdomen is soft and non tender AA is 0 Fall risk is low    10/09/2023    2:11 PM 07/23/2018   11:56 AM 02/20/2017    9:38 AM  Depression screen PHQ 2/9  Decreased Interest 0 2 0  Down, Depressed, Hopeless 0 2 0  PHQ - 2 Score 0 4 0  Altered sleeping 0 0   Tired, decreased energy 1 3   Change in appetite 0 3   Feeling bad or failure about yourself  0 0   Trouble concentrating 0 0   Moving slowly or fidgety/restless 0 0   Suicidal thoughts 0 0   PHQ-9 Score 1 10        10/09/2023    2:11 PM  GAD 7 : Generalized Anxiety Score  Nervous, Anxious, on Edge 0  Control/stop worrying 0  Worry too much - different things 0  Trouble relaxing 0  Restless 0  Easily annoyed or irritable 1  Afraid - awful might happen 0  Total GAD 7 Score 1    Upstream - 10/09/23 1407       Pregnancy Intention Screening   Does the patient want to become pregnant in the next year? N/A    Does the patient's partner want to become pregnant in the next year? N/A    Would the patient like to discuss contraceptive options today? N/A      Contraception Wrap  Up   Current Method Pregnant/Seeking Pregnancy    End Method Pregnant/Seeking Pregnancy    Contraception Counseling Provided No                  Assessment:     1. Pregnancy examination or test, positive result - POCT urine pregnancy  2. Less than [redacted] weeks gestation of pregnancy Will rx PNV  3. Encounter to determine fetal viability of pregnancy, single or unspecified fetus Return 10/20/23 for dating [place] - US OB Comp Less 14 Wks; Future  4. Nausea + nausea, will rx zofran Meds ordered this encounter  Medications   prenatal vitamin w/FE, FA (PRENATAL 1 + 1) 27-1 MG TABS tablet    Sig: Take 1 tablet by mouth daily at 12 noon.    Dispense:  30 tablet    Refill:  12    Order Specific Question:   Supervising Provider    Answer:   Duane Lope H [[id number]]   ondansetron (ZOFRAN) 4 MG tablet    Sig: Take 1 tablet (4 mg total) by mouth every 8 (eight) hours as needed.  Dispense:  20 tablet    Refill:  1    Order Specific Question:   Supervising Provider    Answer:   Despina Hidden, LUTHER H [[id number]]     5. History of preterm delivery  6. History of cesarean section, low transverse Has C section 11 years ago with twins  7. IUFD at 20 weeks or more of gestation  8. Multigravida of advanced maternal age in first trimester  37. History of Crohn's disease  10. Elevated BP without diagnosis of hypertension Will keep check on BP      Plan:     Review OB packet

## 2023-10-10 ENCOUNTER — Other Ambulatory Visit: Payer: Self-pay | Admitting: Adult Health

## 2023-10-10 MED ORDER — PRENATAL GUMMIES 0.18-25 MG PO CHEW: CHEWABLE_TABLET | ORAL | 99 refills | Status: DC

## 2023-10-10 NOTE — Progress Notes (Signed)
Rx Prenatal gummie

## 2023-10-13 ENCOUNTER — Other Ambulatory Visit: Payer: Self-pay | Admitting: Adult Health

## 2023-10-13 MED ORDER — ONDANSETRON 4 MG PO TBDP: 4.0000 mg | ORAL_TABLET | Freq: Three times a day (TID) | ORAL | 1 refills | Status: DC | PRN

## 2023-10-13 NOTE — Progress Notes (Signed)
Rx sent for zofran ODT

## 2023-10-20 ENCOUNTER — Other Ambulatory Visit: Payer: Medicaid Other | Admitting: Radiology

## 2023-10-22 ENCOUNTER — Ambulatory Visit (INDEPENDENT_AMBULATORY_CARE_PROVIDER_SITE_OTHER): Payer: Medicaid Other | Admitting: Radiology

## 2023-10-22 ENCOUNTER — Encounter: Payer: Self-pay | Admitting: Obstetrics & Gynecology

## 2023-10-22 ENCOUNTER — Encounter: Payer: Self-pay | Admitting: Radiology

## 2023-10-22 DIAGNOSIS — O3680X0 Pregnancy with inconclusive fetal viability, not applicable or unspecified: Secondary | ICD-10-CM | POA: Diagnosis not present

## 2023-10-22 DIAGNOSIS — Z3A08 8 weeks gestation of pregnancy: Secondary | ICD-10-CM | POA: Diagnosis not present

## 2023-10-22 NOTE — Progress Notes (Signed)
8+0 weeks by LMP  Anteverted uterus with single viable IUP  -  GS intact within mid fundus CRL = 15.9 mm  =  [redacted]w[redacted]d     size = dates Yolk sac appears normal = mm Normal ovaries  - neg adnexal regions  -  neg CDS - no free fluid Cervix long and closed

## 2023-11-18 ENCOUNTER — Other Ambulatory Visit: Payer: Self-pay | Admitting: Obstetrics & Gynecology

## 2023-11-18 DIAGNOSIS — Z3682 Encounter for antenatal screening for nuchal translucency: Secondary | ICD-10-CM

## 2023-11-19 ENCOUNTER — Encounter: Payer: Medicaid Other | Admitting: *Deleted

## 2023-11-19 ENCOUNTER — Encounter: Payer: Self-pay | Admitting: Women's Health

## 2023-11-19 ENCOUNTER — Ambulatory Visit (INDEPENDENT_AMBULATORY_CARE_PROVIDER_SITE_OTHER): Payer: Medicaid Other

## 2023-11-19 ENCOUNTER — Other Ambulatory Visit (HOSPITAL_COMMUNITY): Payer: Self-pay

## 2023-11-19 ENCOUNTER — Ambulatory Visit: Payer: Medicaid Other | Admitting: Women's Health

## 2023-11-19 VITALS — BP 138/95 | HR 98 | Wt 226.0 lb

## 2023-11-19 DIAGNOSIS — Z3490 Encounter for supervision of normal pregnancy, unspecified, unspecified trimester: Secondary | ICD-10-CM | POA: Insufficient documentation

## 2023-11-19 DIAGNOSIS — Z8751 Personal history of pre-term labor: Secondary | ICD-10-CM | POA: Diagnosis not present

## 2023-11-19 DIAGNOSIS — Z3682 Encounter for antenatal screening for nuchal translucency: Secondary | ICD-10-CM | POA: Diagnosis not present

## 2023-11-19 DIAGNOSIS — Z1332 Encounter for screening for maternal depression: Secondary | ICD-10-CM

## 2023-11-19 DIAGNOSIS — Z131 Encounter for screening for diabetes mellitus: Secondary | ICD-10-CM

## 2023-11-19 DIAGNOSIS — Z6836 Body mass index (BMI) 36.0-36.9, adult: Secondary | ICD-10-CM | POA: Diagnosis not present

## 2023-11-19 DIAGNOSIS — Z98891 History of uterine scar from previous surgery: Secondary | ICD-10-CM

## 2023-11-19 DIAGNOSIS — Z8759 Personal history of other complications of pregnancy, childbirth and the puerperium: Secondary | ICD-10-CM | POA: Diagnosis not present

## 2023-11-19 DIAGNOSIS — Z3481 Encounter for supervision of other normal pregnancy, first trimester: Secondary | ICD-10-CM | POA: Diagnosis not present

## 2023-11-19 DIAGNOSIS — Z3A12 12 weeks gestation of pregnancy: Secondary | ICD-10-CM

## 2023-11-19 DIAGNOSIS — Z3480 Encounter for supervision of other normal pregnancy, unspecified trimester: Secondary | ICD-10-CM

## 2023-11-19 DIAGNOSIS — O0993 Supervision of high risk pregnancy, unspecified, third trimester: Secondary | ICD-10-CM | POA: Insufficient documentation

## 2023-11-19 DIAGNOSIS — O0990 Supervision of high risk pregnancy, unspecified, unspecified trimester: Secondary | ICD-10-CM | POA: Insufficient documentation

## 2023-11-19 MED ORDER — PROGESTERONE 200 MG PO CAPS: 200.0000 mg | ORAL_CAPSULE | Freq: Every day | ORAL | 5 refills | Status: DC

## 2023-11-19 MED ORDER — PROGESTERONE 200 MG PO CAPS
200.0000 mg | ORAL_CAPSULE | Freq: Every day | ORAL | 5 refills | Status: DC
  Filled 2023-11-19: qty 30, 30d supply, fill #0

## 2023-11-19 NOTE — Progress Notes (Signed)
[place] 12 wks,measurements c/w dates,FHR 178 bpm,NB present,NT 1.6 mm,CRL 55.56 mm,normal ovaries,posterior placenta

## 2023-11-19 NOTE — Patient Instructions (Signed)
Lauren Ramirez, thank you for choosing our office today! We appreciate the opportunity to meet your healthcare needs. You may receive a short survey by mail, e-mail, or through [employer]. If you are happy with your care we would appreciate if you could take just a few minutes to complete the survey questions. We read all of your comments and take your feedback very seriously. Thank you again for choosing our office.  Center for [employer] [facility] Team at [facility]  [facility] at [facility] ([address] [place], [place] [address]) Entrance C, located off of [address] Free 24/7 valet parking   Nausea & Vomiting Have saltine crackers or pretzels by your bed and eat a few bites before you raise your head out of bed in the morning Eat small frequent meals throughout the day instead of large meals Drink plenty of fluids throughout the day to stay hydrated, just don't drink a lot of fluids with your meals.  This can make your stomach fill up faster making you feel sick Do not brush your teeth right after you eat Products with real ginger are good for nausea, like ginger ale and ginger hard candy Make sure it says made with real ginger! Sucking on sour candy like lemon heads is also good for nausea If your prenatal vitamins make you nauseated, take them at night so you will sleep through the nausea Sea Bands If you feel like you need medicine for the nausea & vomiting please let us know If you are unable to keep any fluids or food down please let us know   Constipation Drink plenty of fluid, preferably water, throughout the day Eat foods high in fiber such as fruits, vegetables, and grains Exercise, such as walking, is a good way to keep your bowels regular Drink warm fluids, especially warm prune juice, or decaf coffee Eat a 1/2 cup of real oatmeal (not instant), 1/2 cup applesauce, and 1/2-1 cup warm prune juice every day If needed, you may take Colace (docusate sodium) stool softener  once or twice a day to help keep the stool soft.  If you still are having problems with constipation, you may take Miralax once daily as needed to help keep your bowels regular.   Home Blood Pressure Monitoring for Patients   Your provider has recommended that you check your blood pressure (BP) at least once a week at home. If you do not have a blood pressure cuff at home, one will be provided for you. Contact your provider if you have not received your monitor within 1 week.   Helpful Tips for Accurate Home Blood Pressure Checks  Don't smoke, exercise, or drink caffeine 30 minutes before checking your BP Use the restroom before checking your BP (a full bladder can raise your pressure) Relax in a comfortable upright chair Feet on the ground Left arm resting comfortably on a flat surface at the level of your heart Legs uncrossed Back supported Sit quietly and don't talk Place the cuff on your bare arm Adjust snuggly, so that only two fingertips can fit between your skin and the top of the cuff Check 2 readings separated by at least one minute Keep a log of your BP readings For a visual, please reference this diagram: http://ccnc.care/bpdiagram  Provider Name: [facility]     Phone: [phone]  Zone 1: ALL CLEAR  Continue to monitor your symptoms:  BP reading is less than 140 (top number) or less than 90 (bottom  number)  No right upper stomach pain No headaches or seeing spots No feeling nauseated or throwing up No swelling in face and hands  Zone 2: CAUTION Call your doctor's office for any of the following:  BP reading is greater than 140 (top number) or greater than 90 (bottom number)  Stomach pain under your ribs in the middle or right side Headaches or seeing spots Feeling nauseated or throwing up Swelling in face and hands  Zone 3: EMERGENCY  Seek immediate medical care if you have any of the following:  BP reading is greater than160 (top number) or greater than  110 (bottom number) Severe headaches not improving with Tylenol Serious difficulty catching your breath Any worsening symptoms from Zone 2    First Trimester of Pregnancy The first trimester of pregnancy is from week 1 until the end of week 12 (months 1 through 3). A week after a sperm fertilizes an egg, the egg will implant on the wall of the uterus. This embryo will begin to develop into a baby. Genes from you and your partner are forming the baby. The female genes determine whether the baby is a boy or a girl. At 6-8 weeks, the eyes and face are formed, and the heartbeat can be seen on ultrasound. At the end of 12 weeks, all the baby's organs are formed.  Now that you are pregnant, you will want to do everything you can to have a healthy baby. Two of the most important things are to get good prenatal care and to follow your health care provider's instructions. Prenatal care is all the medical care you receive before the baby's birth. This care will help prevent, find, and treat any problems during the pregnancy and childbirth. BODY CHANGES Your body goes through many changes during pregnancy. The changes vary from woman to woman.  You may gain or lose a couple of pounds at first. You may feel sick to your stomach (nauseous) and throw up (vomit). If the vomiting is uncontrollable, call your health care provider. You may tire easily. You may develop headaches that can be relieved by medicines approved by your health care provider. You may urinate more often. Painful urination may mean you have a bladder infection. You may develop heartburn as a result of your pregnancy. You may develop constipation because certain hormones are causing the muscles that push waste through your intestines to slow down. You may develop hemorrhoids or swollen, bulging veins (varicose veins). Your breasts may begin to grow larger and become tender. Your nipples may stick out more, and the tissue that surrounds them  (areola) may become darker. Your gums may bleed and may be sensitive to brushing and flossing. Dark spots or blotches (chloasma, mask of pregnancy) may develop on your face. This will likely fade after the baby is born. Your menstrual periods will stop. You may have a loss of appetite. You may develop cravings for certain kinds of food. You may have changes in your emotions from day to day, such as being excited to be pregnant or being concerned that something may go wrong with the pregnancy and baby. You may have more vivid and strange dreams. You may have changes in your hair. These can include thickening of your hair, rapid growth, and changes in texture. Some women also have hair loss during or after pregnancy, or hair that feels dry or thin. Your hair will most likely return to normal after your baby is born. WHAT TO EXPECT AT YOUR PRENATAL  VISITS During a routine prenatal visit: You will be weighed to make sure you and the baby are growing normally. Your blood pressure will be taken. Your abdomen will be measured to track your baby's growth. The fetal heartbeat will be listened to starting around week 10 or 12 of your pregnancy. Test results from any previous visits will be discussed. Your health care provider may ask you: How you are feeling. If you are feeling the baby move. If you have had any abnormal symptoms, such as leaking fluid, bleeding, severe headaches, or abdominal cramping. If you have any questions. Other tests that may be performed during your first trimester include: Blood tests to find your blood type and to check for the presence of any previous infections. They will also be used to check for low iron levels (anemia) and Rh antibodies. Later in the pregnancy, blood tests for diabetes will be done along with other tests if problems develop. Urine tests to check for infections, diabetes, or protein in the urine. An ultrasound to confirm the proper growth and development  of the baby. An amniocentesis to check for possible genetic problems. Fetal screens for spina bifida and Down syndrome. You may need other tests to make sure you and the baby are doing well. HOME CARE INSTRUCTIONS  Medicines Follow your health care provider's instructions regarding medicine use. Specific medicines may be either safe or unsafe to take during pregnancy. Take your prenatal vitamins as directed. If you develop constipation, try taking a stool softener if your health care provider approves. Diet Eat regular, well-balanced meals. Choose a variety of foods, such as meat or vegetable-based protein, fish, milk and low-fat dairy products, vegetables, fruits, and whole grain breads and cereals. Your health care provider will help you determine the amount of weight gain that is right for you. Avoid raw meat and uncooked cheese. These carry germs that can cause birth defects in the baby. Eating four or five small meals rather than three large meals a day may help relieve nausea and vomiting. If you start to feel nauseous, eating a few soda crackers can be helpful. Drinking liquids between meals instead of during meals also seems to help nausea and vomiting. If you develop constipation, eat more high-fiber foods, such as fresh vegetables or fruit and whole grains. Drink enough fluids to keep your urine clear or pale yellow. Activity and Exercise Exercise only as directed by your health care provider. Exercising will help you: Control your weight. Stay in shape. Be prepared for labor and delivery. Experiencing pain or cramping in the lower abdomen or low back is a good sign that you should stop exercising. Check with your health care provider before continuing normal exercises. Try to avoid standing for long periods of time. Move your legs often if you must stand in one place for a long time. Avoid heavy lifting. Wear low-heeled shoes, and practice good posture. You may continue to have sex  unless your health care provider directs you otherwise. Relief of Pain or Discomfort Wear a good support bra for breast tenderness.   Take warm sitz baths to soothe any pain or discomfort caused by hemorrhoids. Use hemorrhoid cream if your health care provider approves.   Rest with your legs elevated if you have leg cramps or low back pain. If you develop varicose veins in your legs, wear support hose. Elevate your feet for 15 minutes, 3-4 times a day. Limit salt in your diet. Prenatal Care Schedule your prenatal visits by the  twelfth week of pregnancy. They are usually scheduled monthly at first, then more often in the last 2 months before delivery. Write down your questions. Take them to your prenatal visits. Keep all your prenatal visits as directed by your health care provider. Safety Wear your seat belt at all times when driving. Make a list of emergency phone numbers, including numbers for family, friends, the hospital, and police and fire departments. General Tips Ask your health care provider for a referral to a local prenatal education class. Begin classes no later than at the beginning of month 6 of your pregnancy. Ask for help if you have counseling or nutritional needs during pregnancy. Your health care provider can offer advice or refer you to specialists for help with various needs. Do not use hot tubs, steam rooms, or saunas. Do not douche or use tampons or scented sanitary pads. Do not cross your legs for long periods of time. Avoid cat litter boxes and soil used by cats. These carry germs that can cause birth defects in the baby and possibly loss of the fetus by miscarriage or stillbirth. Avoid all smoking, herbs, alcohol, and medicines not prescribed by your health care provider. Chemicals in these affect the formation and growth of the baby. Schedule a dentist appointment. At home, brush your teeth with a soft toothbrush and be gentle when you floss. SEEK MEDICAL CARE IF:   You have dizziness. You have mild pelvic cramps, pelvic pressure, or nagging pain in the abdominal area. You have persistent nausea, vomiting, or diarrhea. You have a bad smelling vaginal discharge. You have pain with urination. You notice increased swelling in your face, hands, legs, or ankles. SEEK IMMEDIATE MEDICAL CARE IF:  You have a fever. You are leaking fluid from your vagina. You have spotting or bleeding from your vagina. You have severe abdominal cramping or pain. You have rapid weight gain or loss. You vomit blood or material that looks like coffee grounds. You are exposed to [place] measles and have never had them. You are exposed to fifth disease or chickenpox. You develop a severe headache. You have shortness of breath. You have any kind of trauma, such as from a fall or a car accident. Document Released: 11/12/2001 Document Revised: 04/04/2014 Document Reviewed: 09/28/2013 [facility] Patient Information 2015 [place], [place]. This information is not intended to replace advice given to you by your health care provider. Make sure you discuss any questions you have with your health care provider.

## 2023-11-19 NOTE — Progress Notes (Signed)
INITIAL OBSTETRICAL VISIT Patient name: Lauren Ramirez MRN [id number]  Date of birth: 15-Nov-1986 Chief Complaint:   Initial Prenatal Visit  History of Present Illness:   Lauren Ramirez is a 37 y.o. [id number] African-American female at [redacted]w[redacted]d by LMP c/w u/s at 8 weeks with an Estimated Date of Delivery: 06/02/24 being seen today for her initial obstetrical visit.   Patient's last menstrual period was 08/27/2023. Her obstetrical history is significant for  32wk PTB d/t PTL, 36wk PTB d/t PTL, 35wk PCS d/t PTL w/ twins (both breech), then 21wk IUFD w/ VBAC .   BP elevated, no h/o HTN, states she's stressed. Checked home bp recently and was wnl. Today she reports  nausea, zofran helps .  Last pap 2018. Results were: NILM w/ HRHPV negative, declines today     11/19/2023    3:29 PM 10/09/2023    2:11 PM 07/23/2018   11:56 AM 02/20/2017    9:38 AM 01/20/2017   11:38 AM  Depression screen PHQ 2/9  Decreased Interest 1 0 2 0 0  Down, Depressed, Hopeless 1 0 2 0 0  PHQ - 2 Score 2 0 4 0 0  Altered sleeping 0 0 0    Tired, decreased energy 1 1 3     Change in appetite 0 0 3    Feeling bad or failure about yourself  0 0 0    Trouble concentrating 1 0 0    Moving slowly or fidgety/restless 0 0 0    Suicidal thoughts 0 0 0    PHQ-9 Score 4 1 10           11/19/2023    3:29 PM 10/09/2023    2:11 PM  GAD 7 : Generalized Anxiety Score  Nervous, Anxious, on Edge 0 0  Control/stop worrying 0 0  Worry too much - different things 1 0  Trouble relaxing 0 0  Restless 0 0  Easily annoyed or irritable 1 1  Afraid - awful might happen 0 0  Total GAD 7 Score 2 1     Review of Systems:   Pertinent items are noted in HPI Denies cramping/contractions, leakage of fluid, vaginal bleeding, abnormal vaginal discharge w/ itching/odor/irritation, headaches, visual changes, shortness of breath, chest pain, abdominal pain, severe nausea/vomiting, or problems with urination or bowel movements  unless otherwise stated above.  Pertinent History Reviewed:  Reviewed past medical,surgical, social, obstetrical and family history.  Reviewed problem list, medications and allergies. OB History  Gravida Para Term Preterm AB Living  6 4 0 4 1 4   SAB IAB Ectopic Multiple Live Births  0 1 0 1 4    # Outcome Date GA Lbr Len/2nd Weight Sex Type Anes PTL Lv  6 Current           5 Preterm 09/07/18 [redacted]w[redacted]d 15:11 15.3 oz (0.434 kg) M Vag-Breech None  FD  4 IAB 2018          3A Preterm 04/14/12 [redacted]w[redacted]d  5 lb 0.6 oz (2.285 kg) M CS-LTranv Spinal  LIV     Birth Comments: preterm appearance c/w 35 wks  3B Preterm 04/14/12 [redacted]w[redacted]d  5 lb 13.3 oz (2.645 kg) M CS-LTranv Spinal Y LIV     Birth Comments: preterm c/w 35 wks     Complications: Preterm premature rupture of membranes (PPROM) delivered, current hospitalization  2 Preterm 05/07/10 [redacted]w[redacted]d  6 lb 4 oz (2.835 kg) F Vag-Spont EPI Y LIV     Birth Comments:  36wks. 17p injections  1 Preterm 04/27/07 [redacted]w[redacted]d  3 lb 2 oz (1.417 kg) F Vag-Spont None Y LIV     Birth Comments: 32 wks   Physical Assessment:   Vitals:   11/19/23 1530 11/19/23 1649  BP: (!) 141/99 (!) 138/95  Pulse: 100 98  Weight: 226 lb (102.5 kg)   Body mass index is 36.48 kg/m.       Physical Examination:  General appearance - well appearing, and in no distress  Mental status - alert, oriented to person, place, and time  Psych:  She has a normal mood and affect  Skin - warm and dry, normal color, no suspicious lesions noted  Chest - effort normal, all lung fields clear to auscultation bilaterally  Heart - normal rate and regular rhythm  Abdomen - soft, nontender  Extremities:  No swelling or varicosities noted  Pelvic - VULVA: normal appearing vulva with no masses, tenderness or lesions  VAGINA: normal appearing vagina with normal color and discharge, no lesions  CERVIX: normal appearing cervix without discharge or lesions, no CMT  Thin prep pap is not done-declines today, wants next  visit  Chaperone: N/A    TODAY'S NT [place] 12 wks,measurements c/w dates,FHR 178 bpm,NB present,NT 1.6 mm,CRL 55.56 mm,normal ovaries,posterior placenta   No results found for this or any previous visit (from the past 24 hours).  Assessment & Plan:  1) Low-Risk Pregnancy [id number] at [redacted]w[redacted]d with an Estimated Date of Delivery: 06/02/24   2) Initial OB visit  3) BP elevated> no h/o HTN, states she's stressed, to start checking home bp's daily for a week or so, if >140/90 let us know. ASA 162mg , baseline labs  4) Prev c/s> vag x 2, then PCS for twins/malpresentation, then 21wk IUFD VBAC, gave TOLAC consent to review  5) H/O PTB x 3> 32, 36, 35wks d/t PTL, reviewed 17P/Makena no longer recommended, discussed prometrium, wants, rx sent  6) AMA-37yo  7) H/O IUFD> 21wks  Meds:  Meds ordered this encounter  Medications   DISCONTD: progesterone (PROMETRIUM) 200 MG capsule    Sig: Place 1 capsule (200 mg total) vaginally at bedtime.    Dispense:  30 capsule    Refill:  5   progesterone (PROMETRIUM) 200 MG capsule    Sig: Place 1 capsule (200 mg total) vaginally at bedtime.    Dispense:  30 capsule    Refill:  5    Initial labs obtained Continue prenatal vitamins Reviewed n/v relief measures and warning s/s to report Reviewed recommended weight gain based on pre-gravid BMI Encouraged well-balanced diet Genetic & carrier screening discussed: requests Panorama and NT/IT, declines Horizon  Ultrasound discussed; fetal survey: requested CCNC completed> form faxed if has or is planning to apply for medicaid The nature of [place] [facility] for [employer] with multiple MDs and other Advanced Practice Providers was explained to patient; also emphasized that fellows, residents, and students are part of our team. Does have home bp cuff. Office bp cuff given: no. Rx sent: n/a. Check bp weekly, let us know if consistently >140/90.   Indications for ASA therapy (per uptodate) OR Two or more of  the following: Obesity (BMI>30 kg/m2) Yes Age >=35 years Yes Sociodemographic characteristics (African American race, low socioeconomic level) Yes  Follow-up: Return in about 4 weeks (around 12/17/2023) for LROB, pap, 2nd IT, MD, in person; then 8wks from now anatomy u/s and LROB w/ CNM/MD.   Orders Placed This Encounter  Procedures   Urine  Culture   GC/Chlamydia Probe Amp   Integrated 1   CBC/D/Plt+RPR+Rh+ABO+RubIgG...   Comprehensive metabolic panel   Protein / creatinine ratio, urine   PANORAMA PRENATAL TEST   Hemoglobin A1c   Hgb [address] [place], [facility] 11/19/2023 4:50 PM

## 2023-11-21 ENCOUNTER — Other Ambulatory Visit: Payer: Medicaid Other

## 2023-11-21 LAB — URINE CULTURE

## 2023-11-21 LAB — GC/CHLAMYDIA PROBE AMP
Chlamydia trachomatis, NAA: NEGATIVE
Neisseria Gonorrhoeae by PCR: NEGATIVE

## 2023-11-23 LAB — CBC/D/PLT+RPR+RH+ABO+RUBIGG...
Antibody Screen: NEGATIVE
Basophils Absolute: 0 10*3/uL (ref 0.0–0.2)
Basos: 0 %
EOS (ABSOLUTE): 0.2 10*3/uL (ref 0.0–0.4)
Eos: 2 %
HCV Ab: NONREACTIVE
HIV Screen 4th Generation wRfx: NONREACTIVE
Hematocrit: 35.8 % (ref 34.0–46.6)
Hemoglobin: 12.2 g/dL (ref 11.1–15.9)
Hepatitis B Surface Ag: NEGATIVE
Immature Grans (Abs): 0 10*3/uL (ref 0.0–0.1)
Immature Granulocytes: 0 %
Lymphocytes Absolute: 1.9 10*3/uL (ref 0.7–3.1)
Lymphs: 20 %
MCH: 28.9 pg (ref 26.6–33.0)
MCHC: 34.1 g/dL (ref 31.5–35.7)
MCV: 85 fL (ref 79–97)
Monocytes Absolute: 0.5 10*3/uL (ref 0.1–0.9)
Monocytes: 5 %
Neutrophils Absolute: 6.9 10*3/uL (ref 1.4–7.0)
Neutrophils: 73 %
Platelets: 375 10*3/uL (ref 150–450)
RBC: 4.22 x10E6/uL (ref 3.77–5.28)
RDW: 11.8 % (ref 11.7–15.4)
Rh Factor: POSITIVE
Rubella Antibodies, IGG: 1.58 {index} (ref >0.99)
WBC: 9.5 10*3/uL (ref 3.4–10.8)

## 2023-11-23 LAB — INTEGRATED 1
Crown Rump Length: 55.6 mm
Gest. Age on Collection Date: 12 wk
Maternal Age at EDD: 36.5 a
Nuchal Translucency (NT): 1.6 mm
Number of Fetuses: 1
PAPP-A Value: 386.9 ng/mL
Sonographer ID#: 309760
Weight: 226 [lb_av]

## 2023-11-23 LAB — COMPREHENSIVE METABOLIC PANEL
ALT: 8 [IU]/L (ref 0–32)
AST: 12 [IU]/L (ref 0–40)
Albumin: 3.8 g/dL — ABNORMAL LOW (ref 3.9–4.9)
Alkaline Phosphatase: 88 [IU]/L (ref 44–121)
BUN/Creatinine Ratio: 11 (ref 9–23)
BUN: 6 mg/dL (ref 6–20)
Bilirubin Total: 0.3 mg/dL (ref 0.0–1.2)
CO2: 18 mmol/L — ABNORMAL LOW (ref 20–29)
Calcium: 8.9 mg/dL (ref 8.7–10.2)
Chloride: 103 mmol/L (ref 96–106)
Creatinine, Ser: 0.56 mg/dL — ABNORMAL LOW (ref 0.57–1.00)
Globulin, Total: 3.3 g/dL (ref 1.5–4.5)
Glucose: 93 mg/dL (ref 70–99)
Potassium: 3.8 mmol/L (ref 3.5–5.2)
Sodium: 137 mmol/L (ref 134–144)
Total Protein: 7.1 g/dL (ref 6.0–8.5)
eGFR: 122 mL/min/{1.73_m2} (ref >59)

## 2023-11-23 LAB — HCV INTERPRETATION

## 2023-11-23 LAB — HEMOGLOBIN A1C
Est. average glucose Bld gHb Est-mCnc: 120 mg/dL
Hgb A1c MFr Bld: 5.8 % — ABNORMAL HIGH (ref 4.8–5.6)

## 2023-11-23 LAB — HGB FRACTIONATION CASCADE
Hgb A2: 2.6 % (ref 1.8–3.2)
Hgb A: 97.4 % (ref 96.4–98.8)
Hgb F: 0 % (ref 0.0–2.0)
Hgb S: 0 %

## 2023-11-23 LAB — PROTEIN / CREATININE RATIO, URINE
Creatinine, Urine: 153.8 mg/dL
Protein, Ur: 11.8 mg/dL
Protein/Creat Ratio: 77 mg/g{creat} (ref 0–200)

## 2023-11-24 ENCOUNTER — Other Ambulatory Visit: Payer: Medicaid Other

## 2023-11-24 DIAGNOSIS — Z3A12 12 weeks gestation of pregnancy: Secondary | ICD-10-CM

## 2023-11-24 DIAGNOSIS — Z131 Encounter for screening for diabetes mellitus: Secondary | ICD-10-CM

## 2023-11-24 DIAGNOSIS — R7309 Other abnormal glucose: Secondary | ICD-10-CM

## 2023-11-28 LAB — PANORAMA PRENATAL TEST FULL PANEL:PANORAMA TEST PLUS 5 ADDITIONAL MICRODELETIONS: FETAL FRACTION: 5.1

## 2023-12-18 ENCOUNTER — Ambulatory Visit (INDEPENDENT_AMBULATORY_CARE_PROVIDER_SITE_OTHER): Payer: Medicaid Other | Admitting: Obstetrics & Gynecology

## 2023-12-18 ENCOUNTER — Other Ambulatory Visit (HOSPITAL_COMMUNITY)
Admission: RE | Admit: 2023-12-18 | Discharge: 2023-12-18 | Disposition: A | Discharge status: Home / Self Care | Payer: Medicaid Other | Source: Ambulatory Visit | Attending: Obstetrics & Gynecology | Admitting: Obstetrics & Gynecology

## 2023-12-18 VITALS — BP 141/92 | HR 92 | Wt 223.0 lb

## 2023-12-18 DIAGNOSIS — Z98891 History of uterine scar from previous surgery: Secondary | ICD-10-CM

## 2023-12-18 DIAGNOSIS — Z124 Encounter for screening for malignant neoplasm of cervix: Secondary | ICD-10-CM

## 2023-12-18 DIAGNOSIS — O0993 Supervision of high risk pregnancy, unspecified, third trimester: Secondary | ICD-10-CM

## 2023-12-18 DIAGNOSIS — O10912 Unspecified pre-existing hypertension complicating pregnancy, second trimester: Secondary | ICD-10-CM

## 2023-12-18 DIAGNOSIS — Z3A16 16 weeks gestation of pregnancy: Secondary | ICD-10-CM | POA: Insufficient documentation

## 2023-12-18 DIAGNOSIS — Z3480 Encounter for supervision of other normal pregnancy, unspecified trimester: Secondary | ICD-10-CM

## 2023-12-18 DIAGNOSIS — I10 Essential (primary) hypertension: Secondary | ICD-10-CM | POA: Insufficient documentation

## 2023-12-18 DIAGNOSIS — O10919 Unspecified pre-existing hypertension complicating pregnancy, unspecified trimester: Secondary | ICD-10-CM | POA: Insufficient documentation

## 2023-12-18 DIAGNOSIS — Z1379 Encounter for other screening for genetic and chromosomal anomalies: Secondary | ICD-10-CM

## 2023-12-18 DIAGNOSIS — O0992 Supervision of high risk pregnancy, unspecified, second trimester: Secondary | ICD-10-CM

## 2023-12-18 DIAGNOSIS — Z8751 Personal history of pre-term labor: Secondary | ICD-10-CM

## 2023-12-18 MED ORDER — ASPIRIN 81 MG PO TBEC: 81.0000 mg | DELAYED_RELEASE_TABLET | Freq: Every day | ORAL | 12 refills | Status: DC

## 2023-12-18 MED ORDER — NIFEDIPINE ER OSMOTIC RELEASE 30 MG PO TB24: 30.0000 mg | ORAL_TABLET | Freq: Every day | ORAL | 3 refills | Status: DC

## 2023-12-18 NOTE — Progress Notes (Signed)
HIGH-RISK PREGNANCY VISIT Patient name: Lauren Ramirez MRN [id number]  Date of birth: Feb 28, 1986 Chief Complaint:   Routine Prenatal Visit  History of Present Illness:   Lauren Ramirez is a 38 y.o. [id number] female at [redacted]w[redacted]d with an Estimated Date of Delivery: 06/02/24 being seen today for ongoing management of a high-risk pregnancy complicated by:  -prior preterm delivery x 3  with prior C-section x 1 Forgets to take progesterone   Today she reports no complaints.   Contractions: Not present. Vag. Bleeding: None.  Movement: feeling flutters. denies leaking of fluid.      11/19/2023    3:29 PM 10/09/2023    2:11 PM 07/23/2018   11:56 AM 02/20/2017    9:38 AM 01/20/2017   11:38 AM  Depression screen PHQ 2/9  Decreased Interest 1 0 2 0 0  Down, Depressed, Hopeless 1 0 2 0 0  PHQ - 2 Score 2 0 4 0 0  Altered sleeping 0 0 0    Tired, decreased energy 1 1 3     Change in appetite 0 0 3    Feeling bad or failure about yourself  0 0 0    Trouble concentrating 1 0 0    Moving slowly or fidgety/restless 0 0 0    Suicidal thoughts 0 0 0    PHQ-9 Score 4 1 10        Current Outpatient Medications  Medication Instructions   acetaminophen (TYLENOL) 500 mg, Every 6 hours PRN   cetirizine (ZYRTEC ALLERGY) 10 mg, Oral, Daily   cyanocobalamin (VITAMIN B12) 1000 MCG/ML injection Inject into the muscle.   fluticasone (FLONASE) 50 MCG/ACT nasal spray 1 spray, Each Nare, Daily   ondansetron (ZOFRAN-ODT) 4 mg, Oral, Every 8 hours PRN   Prenatal MV & Min w/FA-DHA (PRENATAL GUMMIES) 0.18-25 MG CHEW Take as directed   progesterone (PROMETRIUM) 200 mg, Vaginal, Daily at bedtime     Review of Systems:   Pertinent items are noted in HPI Denies abnormal vaginal discharge w/ itching/odor/irritation, headaches, visual changes, shortness of breath, chest pain, abdominal pain, severe nausea/vomiting, or problems with urination or bowel movements unless otherwise stated above. Pertinent  History Reviewed:  Reviewed past medical,surgical, social, obstetrical and family history.  Reviewed problem list, medications and allergies. Physical Assessment:   Vitals:   12/18/23 1522 12/18/23 1541  BP: (!) 148/98 (!) 141/92  Pulse: 96 92  Weight: 223 lb (101.2 kg)   Body mass index is 35.99 kg/m.           Physical Examination:   General appearance: alert, well appearing, and in no distress  Mental status: normal mood, behavior, speech, dress, motor activity, and thought processes  Skin: warm & dry   Extremities:      Cardiovascular: normal heart rate noted  Respiratory: normal respiratory effort, no distress  Abdomen: gravid, soft, non-tender  Pelvic: Cervical exam deferred  SSE completed- cervix appears closed, pap/HPV obtained       Fetal Status: Fetal Heart Rate (bpm): 160   Movement: Absent    Fetal Surveillance Testing today: doppler   Chaperone: Faith Rogue    No results found for this or any previous visit (from the past 24 hours).   Assessment & Plan:  High-risk pregnancy: [id number] at [redacted]w[redacted]d with an Estimated Date of Delivery: 06/02/24   1) newly diagnosed cHTN -procardia XL30mg  daily -aspirin daily -Plan for growth every 4 weeks as well as antepartum testing at 32 weeks  2) h/o C-section, and  prior PTD -Encouraged regular use of progesterone  Meds: No orders of the defined types were placed in this encounter.   Labs/procedures today: IT-2  Treatment Plan:  routine OB Care and as outlined above  Reviewed: Preterm labor symptoms and general obstetric precautions including but not limited to vaginal bleeding, contractions, leaking of fluid and fetal movement were reviewed in detail with the patient.  All questions were answered. Pt has home bp cuff-concerned that it is not accurate.  Advised patient to bring to next appointment   Follow-up: Return for as scheduled 2/12 HROB visit and anatomy scan.   Future Appointments  Date Time Provider Department  Center  01/14/2024 10:00 AM [facility] 2 [facility] None  01/14/2024 10:50 AM Arabella Merles, CNM [facility]    Orders Placed This Encounter  Procedures   INTEGRATED 2    Myna Hidalgo, DO Attending Obstetrician & Gynecologist, [facility] for [facility], [facility]

## 2023-12-20 LAB — INTEGRATED 2
AFP MoM: 1.47
Alpha-Fetoprotein: 37 ng/mL
Crown Rump Length: 55.6 mm
DIA MoM: 1.16
DIA Value: 150 pg/mL
Estriol, Unconjugated: 0.88 ng/mL
Gest. Age on Collection Date: 12 wk
Gestational Age: 16.1 wk
Maternal Age at EDD: 36.5 a
Nuchal Translucency (NT): 1.6 mm
Nuchal Translucency MoM: 1.02
Number of Fetuses: 1
PAPP-A MoM: 0.78
PAPP-A Value: 386.9 ng/mL
Sonographer ID#: 309760
Test Results:: NEGATIVE
Weight: 226 [lb_av]
Weight: 226 [lb_av]
hCG MoM: 0.42
hCG Value: 11 [IU]/mL
uE3 MoM: 1.16

## 2023-12-21 ENCOUNTER — Encounter: Payer: Self-pay | Admitting: Obstetrics & Gynecology

## 2023-12-23 LAB — CYTOLOGY - PAP
Comment: NEGATIVE
Comment: NEGATIVE
Comment: NEGATIVE
Diagnosis: UNDETERMINED — AB
HPV 16: NEGATIVE
HPV 18 / 45: NEGATIVE
High risk HPV: POSITIVE — AB

## 2023-12-24 ENCOUNTER — Encounter: Payer: Self-pay | Admitting: Obstetrics & Gynecology

## 2023-12-24 DIAGNOSIS — R87610 Atypical squamous cells of undetermined significance on cytologic smear of cervix (ASC-US): Secondary | ICD-10-CM | POA: Insufficient documentation

## 2023-12-24 DIAGNOSIS — R87619 Unspecified abnormal cytological findings in specimens from cervix uteri: Secondary | ICD-10-CM | POA: Insufficient documentation

## 2024-01-12 ENCOUNTER — Other Ambulatory Visit: Payer: Self-pay | Admitting: Obstetrics & Gynecology

## 2024-01-12 DIAGNOSIS — Z8751 Personal history of pre-term labor: Secondary | ICD-10-CM

## 2024-01-12 DIAGNOSIS — Z8759 Personal history of other complications of pregnancy, childbirth and the puerperium: Secondary | ICD-10-CM

## 2024-01-12 DIAGNOSIS — O09529 Supervision of elderly multigravida, unspecified trimester: Secondary | ICD-10-CM

## 2024-01-12 DIAGNOSIS — O10919 Unspecified pre-existing hypertension complicating pregnancy, unspecified trimester: Secondary | ICD-10-CM

## 2024-01-12 DIAGNOSIS — Z363 Encounter for antenatal screening for malformations: Secondary | ICD-10-CM

## 2024-01-12 DIAGNOSIS — O34219 Maternal care for unspecified type scar from previous cesarean delivery: Secondary | ICD-10-CM

## 2024-01-14 ENCOUNTER — Encounter: Payer: Medicaid Other | Admitting: Advanced Practice Midwife

## 2024-01-14 ENCOUNTER — Other Ambulatory Visit: Payer: Medicaid Other | Admitting: Radiology

## 2024-01-19 ENCOUNTER — Ambulatory Visit: Payer: Medicaid Other | Admitting: Radiology

## 2024-01-19 ENCOUNTER — Ambulatory Visit: Payer: Medicaid Other | Admitting: Advanced Practice Midwife

## 2024-01-19 ENCOUNTER — Encounter: Payer: Self-pay | Admitting: Advanced Practice Midwife

## 2024-01-19 VITALS — BP 144/96 | HR 92 | Wt 230.0 lb

## 2024-01-19 DIAGNOSIS — Z3A20 20 weeks gestation of pregnancy: Secondary | ICD-10-CM

## 2024-01-19 DIAGNOSIS — O34211 Maternal care for low transverse scar from previous cesarean delivery: Secondary | ICD-10-CM

## 2024-01-19 DIAGNOSIS — Z363 Encounter for antenatal screening for malformations: Secondary | ICD-10-CM

## 2024-01-19 DIAGNOSIS — Z98891 History of uterine scar from previous surgery: Secondary | ICD-10-CM

## 2024-01-19 DIAGNOSIS — O10912 Unspecified pre-existing hypertension complicating pregnancy, second trimester: Secondary | ICD-10-CM | POA: Diagnosis not present

## 2024-01-19 DIAGNOSIS — O10919 Unspecified pre-existing hypertension complicating pregnancy, unspecified trimester: Secondary | ICD-10-CM

## 2024-01-19 DIAGNOSIS — Z8751 Personal history of pre-term labor: Secondary | ICD-10-CM | POA: Diagnosis not present

## 2024-01-19 DIAGNOSIS — O09529 Supervision of elderly multigravida, unspecified trimester: Secondary | ICD-10-CM

## 2024-01-19 DIAGNOSIS — O34219 Maternal care for unspecified type scar from previous cesarean delivery: Secondary | ICD-10-CM

## 2024-01-19 DIAGNOSIS — Z6836 Body mass index (BMI) 36.0-36.9, adult: Secondary | ICD-10-CM | POA: Insufficient documentation

## 2024-01-19 DIAGNOSIS — O0993 Supervision of high risk pregnancy, unspecified, third trimester: Secondary | ICD-10-CM | POA: Diagnosis not present

## 2024-01-19 DIAGNOSIS — R7309 Other abnormal glucose: Secondary | ICD-10-CM

## 2024-01-19 DIAGNOSIS — R7303 Prediabetes: Secondary | ICD-10-CM

## 2024-01-19 DIAGNOSIS — Z8759 Personal history of other complications of pregnancy, childbirth and the puerperium: Secondary | ICD-10-CM | POA: Diagnosis not present

## 2024-01-19 MED ORDER — ACCU-CHEK SOFTCLIX LANCETS MISC: 12 refills | Status: DC

## 2024-01-19 MED ORDER — ACCU-CHEK GUIDE ME W/DEVICE KIT: 1.0000 | PACK | Freq: Four times a day (QID) | 0 refills | Status: DC

## 2024-01-19 MED ORDER — GLUCOSE BLOOD VI STRP: ORAL_STRIP | 12 refills | Status: DC

## 2024-01-19 NOTE — Progress Notes (Signed)
 HIGH-RISK PREGNANCY VISIT Patient name: Lauren Ramirez MRN [id number]  Date of birth: 11/14/1986 Chief Complaint:   Routine Prenatal Visit  History of Present Illness:   Lauren Ramirez is a 38 y.o. [id number] female at [redacted]w[redacted]d with an Estimated Date of Delivery: 06/02/24 being seen today for ongoing management of a high-risk pregnancy complicated by chronic hypertension currently on Procardia30XL.  Has not picked up rx for Procardia or Prometrium.  Admittedly , does not like taking pills, often has trouble remembering . Understands that BP is quite elevated, and agrees to take meds.  Will also make an effort to remember prometrium.  Has some unresolved grief regarding loss of son in 2019.  Did not get GTT (screening HgbA1c 5.8).  Offered 2 weeks QID BS testing, pt prefers to do that.    Today she reports no complaints. Contractions: Not present. Vag. Bleeding: None.  Movement: Present. denies leaking of fluid.      11/19/2023    3:29 PM 10/09/2023    2:11 PM 07/23/2018   11:56 AM 02/20/2017    9:38 AM 01/20/2017   11:38 AM  Depression screen PHQ 2/9  Decreased Interest 1 0 2 0 0  Down, Depressed, Hopeless 1 0 2 0 0  PHQ - 2 Score 2 0 4 0 0  Altered sleeping 0 0 0    Tired, decreased energy 1 1 3     Change in appetite 0 0 3    Feeling bad or failure about yourself  0 0 0    Trouble concentrating 1 0 0    Moving slowly or fidgety/restless 0 0 0    Suicidal thoughts 0 0 0    PHQ-9 Score 4 1 10           11/19/2023    3:29 PM 10/09/2023    2:11 PM  GAD 7 : Generalized Anxiety Score  Nervous, Anxious, on Edge 0 0  Control/stop worrying 0 0  Worry too much - different things 1 0  Trouble relaxing 0 0  Restless 0 0  Easily annoyed or irritable 1 1  Afraid - awful might happen 0 0  Total GAD 7 Score 2 1     Review of Systems:   Pertinent items are noted in HPI Denies abnormal vaginal discharge w/ itching/odor/irritation, headaches, visual changes, shortness of breath,  chest pain, abdominal pain, severe nausea/vomiting, or problems with urination or bowel movements unless otherwise stated above. Pertinent History Reviewed:  Reviewed past medical,surgical, social, obstetrical and family history.  Reviewed problem list, medications and allergies. Physical Assessment:   Vitals:   01/19/24 1510 01/19/24 1529  BP: (!) 156/100 (!) 144/96  Pulse: 92   Weight: 230 lb (104.3 kg)   Body mass index is 37.12 kg/m.           Physical Examination:   General appearance: alert, well appearing, and in no distress  Mental status: alert, oriented to person, place, and time  Skin: warm & dry   Extremities: Edema: None    Cardiovascular: normal heart rate noted  Respiratory: normal respiratory effort, no distress  Abdomen: gravid, soft, non-tender  Pelvic: Cervical exam deferred         Chaperone: N/A    Fetal Status:     Movement: Present    Fetal Surveillance Testing today: [place]:  GA = 20+5 weeks Single active female fetus,  breech,  FHR = 155 bpm Posterior pl, gr1,  MVP = 4.6 cm   EFW  371,  43%   Cardiac screen incomplete  -  Need LVOT  RVOT great vessels crossing No apparent abn seen Nl ov's ,  CL = 5.6 cm  closed     No results found for this or any previous visit (from the past 24 hours).  Assessment & Plan:  High-risk pregnancy: [id number] at [redacted]w[redacted]d with an Estimated Date of Delivery: 06/02/24   1. Chronic hypertension affecting pregnancy Start Procardia 30XL GIven BP cuff from office  2. History of cesarean section, low transverse Unsure if TOLAC vs RCS  3. Supervision of high-risk pregnancy, third trimester (Primary)   4. BMI 36.0-36.9,adult Will be under CHTN testing  5. History of preterm delivery Start prometrium   6.  Grief/sadness:  [facility] information given  7.  Prediabetes:  rx testing supplies; check BS FBS and 2 hr pp for 2 weeks, bring log to next appt    Meds:  Meds ordered this encounter  Medications   Blood Glucose  Monitoring Suppl (ACCU-CHEK GUIDE ME) w/Device KIT    Sig: 1 each by Does not apply route 4 (four) times daily.    Dispense:  1 kit    Refill:  0    Please use [id number]  RXPCN:[id number]   JYNWG:[id number]  MV:[id number]    Issuer:[id number]   Accu-Chek Softclix Lancets lancets    Sig: Use as instructed to check blood sugar 4 times daily    Dispense:  100 each    Refill:  12   glucose blood test strip    Sig: Use as instructed to check blood sugar four times daily    Dispense:  100 each    Refill:  12    Orders: No orders of the defined types were placed in this encounter.    Labs/procedures today: U/S   Reviewed:  general obstetric precautions including but not limited to vaginal bleeding, contractions, leaking of fluid and fetal movement were reviewed in detail with the patient.  All questions were answered. . Office bp cuff given: yes. Check bp daily, let us know if consistently >140 and/or >90.  Follow-up: Return for EFW q 4 weeks; RN BP check video visit Friday; HROB 3 weeks.   Future Appointments  Date Time Provider Department Center  01/23/2024 11:30 AM [facility] NURSE [facility]  02/09/2024  2:50 PM Cheral Marker, CNM [facility]  02/19/2024  3:00 PM [facility] 2 [facility] None  02/19/2024  3:50 PM Lazaro Arms, MD [facility]  03/22/2024  3:00 PM [facility] [place] [facility] None  03/22/2024  3:50 PM Cheral Marker, CNM [facility]  04/19/2024  3:00 PM [facility] [place] [facility] None  05/17/2024  3:00 PM [facility] [place] [facility] None    No orders of the defined types were placed in this encounter.  Jacklyn Shell , DNP, CNM [facility] 01/19/2024 8:42 PM

## 2024-01-19 NOTE — Patient Instructions (Addendum)
 Fasting:  <95 2 hours after meals <120  Check fasting and after each meal for 2 weeks, write down time of day and results      [facility] offers online women's holistic mental health counseling and therapy provided by licensed mental health counselors and therapists.   You can refer yourself using the link below: (if it isn't clickable from your mychart account, copy and paste it in a new browser).  If you have ANY problems, please let me know and I will help troubleshoot.   [email or web address]  OR  [email or web address]    Kateri Mc, I greatly value your feedback.  If you receive a survey following your visit with [place] today, we appreciate you taking the time to fill it out.  Thanks, Cathie Beams, CNM     [facility] HAS MOVED!!! It is now [facility] ([address] [place], [place] [address]) Entrance located off of [address] Free 24/7 valet parking   Go to [email or web address] to register for FREE online childbirth classes    Second Trimester of Pregnancy The second trimester is from week 14 through week 27 (months 4 through 6). The second trimester is often a time when you feel your best. Your body has adjusted to being pregnant, and you begin to feel better physically. Usually, morning sickness has lessened or quit completely, you may have more energy, and you may have an increase in appetite. The second trimester is also a time when the fetus is growing rapidly. At the end of the sixth month, the fetus is about 9 inches long and weighs about 1 pounds. You will likely begin to feel the baby move (quickening) between 16 and 20 weeks of pregnancy. Body changes during your second trimester Your body continues to go through many changes during your second trimester. The changes vary from woman to woman. Your weight will continue to increase. You will  notice your lower abdomen bulging out. You may begin to get stretch marks on your hips, abdomen, and breasts. You may develop headaches that can be relieved by medicines. The medicines should be approved by your health care provider. You may urinate more often because the fetus is pressing on your bladder. You may develop or continue to have heartburn as a result of your pregnancy. You may develop constipation because certain hormones are causing the muscles that push waste through your intestines to slow down. You may develop hemorrhoids or swollen, bulging veins (varicose veins). You may have back pain. This is caused by: Weight gain. Pregnancy hormones that are relaxing the joints in your pelvis. A shift in weight and the muscles that support your balance. Your breasts will continue to grow and they will continue to become tender. Your gums may bleed and may be sensitive to brushing and flossing. Dark spots or blotches (chloasma, mask of pregnancy) may develop on your face. This will likely fade after the baby is born. A dark line from your belly button to the pubic area (linea nigra) may appear. This will likely fade after the baby is born. You may have changes in your hair. These can include thickening of your hair, rapid growth, and changes in texture. Some women also have hair loss during or after pregnancy, or hair that feels dry or thin. Your hair will most likely return to normal after your baby is born.  What to expect at prenatal visits During a routine prenatal visit: You will be weighed to make  sure you and the fetus are growing normally. Your blood pressure will be taken. Your abdomen will be measured to track your baby's growth. The fetal heartbeat will be listened to. Any test results from the previous visit will be discussed.  Your health care provider may ask you: How you are feeling. If you are feeling the baby move. If you have had any abnormal symptoms, such as  leaking fluid, bleeding, severe headaches, or abdominal cramping. If you are using any tobacco products, including cigarettes, chewing tobacco, and electronic cigarettes. If you have any questions.  Other tests that may be performed during your second trimester include: Blood tests that check for: Low iron levels (anemia). High blood sugar that affects pregnant women (gestational diabetes) between 10 and 28 weeks. Rh antibodies. This is to check for a protein on red blood cells (Rh factor). Urine tests to check for infections, diabetes, or protein in the urine. An ultrasound to confirm the proper growth and development of the baby. An amniocentesis to check for possible genetic problems. Fetal screens for spina bifida and Down syndrome. HIV (human immunodeficiency virus) testing. Routine prenatal testing includes screening for HIV, unless you choose not to have this test.  Follow these instructions at home: Medicines Follow your health care provider's instructions regarding medicine use. Specific medicines may be either safe or unsafe to take during pregnancy. Take a prenatal vitamin that contains at least 600 micrograms (mcg) of folic acid. If you develop constipation, try taking a stool softener if your health care provider approves. Eating and drinking Eat a balanced diet that includes fresh fruits and vegetables, whole grains, good sources of protein such as meat, eggs, or tofu, and low-fat dairy. Your health care provider will help you determine the amount of weight gain that is right for you. Avoid raw meat and uncooked cheese. These carry germs that can cause birth defects in the baby. If you have low calcium intake from food, talk to your health care provider about whether you should take a daily calcium supplement. Limit foods that are high in fat and processed sugars, such as fried and sweet foods. To prevent constipation: Drink enough fluid to keep your urine clear or pale  yellow. Eat foods that are high in fiber, such as fresh fruits and vegetables, whole grains, and beans. Activity Exercise only as directed by your health care provider. Most women can continue their usual exercise routine during pregnancy. Try to exercise for 30 minutes at least 5 days a week. Stop exercising if you experience uterine contractions. Avoid heavy lifting, wear low heel shoes, and practice good posture. A sexual relationship may be continued unless your health care provider directs you otherwise. Relieving pain and discomfort Wear a good support bra to prevent discomfort from breast tenderness. Take warm sitz baths to soothe any pain or discomfort caused by hemorrhoids. Use hemorrhoid cream if your health care provider approves. Rest with your legs elevated if you have leg cramps or low back pain. If you develop varicose veins, wear support hose. Elevate your feet for 15 minutes, 3-4 times a day. Limit salt in your diet. Prenatal Care Write down your questions. Take them to your prenatal visits. Keep all your prenatal visits as told by your health care provider. This is important. Safety Wear your seat belt at all times when driving. Make a list of emergency phone numbers, including numbers for family, friends, the hospital, and police and fire departments. General instructions Ask your health care provider for  a referral to a local prenatal education class. Begin classes no later than the beginning of month 6 of your pregnancy. Ask for help if you have counseling or nutritional needs during pregnancy. Your health care provider can offer advice or refer you to specialists for help with various needs. Do not use hot tubs, steam rooms, or saunas. Do not douche or use tampons or scented sanitary pads. Do not cross your legs for long periods of time. Avoid cat litter boxes and soil used by cats. These carry germs that can cause birth defects in the baby and possibly loss of the fetus  by miscarriage or stillbirth. Avoid all smoking, herbs, alcohol, and unprescribed drugs. Chemicals in these products can affect the formation and growth of the baby. Do not use any products that contain nicotine or tobacco, such as cigarettes and e-cigarettes. If you need help quitting, ask your health care provider. Visit your dentist if you have not gone yet during your pregnancy. Use a soft toothbrush to brush your teeth and be gentle when you floss. Contact a health care provider if: You have dizziness. You have mild pelvic cramps, pelvic pressure, or nagging pain in the abdominal area. You have persistent nausea, vomiting, or diarrhea. You have a bad smelling vaginal discharge. You have pain when you urinate. Get help right away if: You have a fever. You are leaking fluid from your vagina. You have spotting or bleeding from your vagina. You have severe abdominal cramping or pain. You have rapid weight gain or weight loss. You have shortness of breath with chest pain. You notice sudden or extreme swelling of your face, hands, ankles, feet, or legs. You have not felt your baby move in over an hour. You have severe headaches that do not go away when you take medicine. You have vision changes. Summary The second trimester is from week 14 through week 27 (months 4 through 6). It is also a time when the fetus is growing rapidly. Your body goes through many changes during pregnancy. The changes vary from woman to woman. Avoid all smoking, herbs, alcohol, and unprescribed drugs. These chemicals affect the formation and growth your baby. Do not use any tobacco products, such as cigarettes, chewing tobacco, and e-cigarettes. If you need help quitting, ask your health care provider. Contact your health care provider if you have any questions. Keep all prenatal visits as told by your health care provider. This is important. This information is not intended to replace advice given to you by your  health care provider. Make sure you discuss any questions you have with your health care provider.

## 2024-01-19 NOTE — Progress Notes (Signed)
 [place]:  GA = 20+5 weeks Single active female fetus,  breech,  FHR = 155 bpm Posterior pl, gr1,  MVP = 4.6 cm   EFW 371,  43%   Cardiac screen incomplete  -  Need LVOT  RVOT great vessels crossing No apparent abn seen Nl ov's ,  CL = 5.6 cm  closed

## 2024-01-22 DIAGNOSIS — K5000 Crohn's disease of small intestine without complications: Secondary | ICD-10-CM | POA: Diagnosis not present

## 2024-01-23 ENCOUNTER — Encounter: Payer: Self-pay | Admitting: *Deleted

## 2024-01-23 ENCOUNTER — Other Ambulatory Visit: Payer: Self-pay | Admitting: *Deleted

## 2024-01-23 ENCOUNTER — Telehealth (INDEPENDENT_AMBULATORY_CARE_PROVIDER_SITE_OTHER): Payer: Medicaid Other | Admitting: *Deleted

## 2024-01-23 VITALS — BP 120/89 | HR 84

## 2024-01-23 DIAGNOSIS — O10919 Unspecified pre-existing hypertension complicating pregnancy, unspecified trimester: Secondary | ICD-10-CM

## 2024-01-23 DIAGNOSIS — Z0130 Encounter for examination of blood pressure without abnormal findings: Secondary | ICD-10-CM

## 2024-01-23 DIAGNOSIS — Z3480 Encounter for supervision of other normal pregnancy, unspecified trimester: Secondary | ICD-10-CM

## 2024-01-23 MED ORDER — NIFEDIPINE ER OSMOTIC RELEASE 30 MG PO TB24: 30.0000 mg | ORAL_TABLET | Freq: Every day | ORAL | 3 refills | Status: DC

## 2024-01-23 NOTE — Progress Notes (Signed)
   NURSE VISIT- BLOOD PRESSURE CHECK  I connected with Lauren Ramirez on 01/23/2024 by telephone  and verified that I am speaking with the correct person using two identifiers.   I discussed the limitations of evaluation and management by telemedicine. The patient expressed understanding and agreed to proceed.  Nurse is at the office, and patient is at home.  SUBJECTIVE:  Lauren Ramirez is a 38 y.o. [phone] female here for BP check. She is [redacted]w[redacted]d pregnant    HYPERTENSION ROS:  Pregnant/postpartum:  Severe headaches that don't go away with tylenol/other medicines: No  Visual changes (seeing spots/double/blurred vision) No  Severe pain under right breast breast or in center of upper chest No  Severe nausea/vomiting No  Taking medicines as instructed no    OBJECTIVE:  BP 120/89 (BP Location: Left Arm, Patient Position: Sitting, Cuff Size: Normal)   Pulse 84   LMP 08/27/2023   Appearance alert, well appearing, and in no distress.  ASSESSMENT: Pregnancy [redacted]w[redacted]d  blood pressure check  PLAN: Discussed with Dr. Despina Hidden   Recommendations: no changes needed. Pt hasn't been taking BP med. Med was resent to pharmacy. Advised to keep checking BP daily.  Follow-up: as scheduled   Malachy Mood  01/23/2024 12:08 PM

## 2024-02-05 DIAGNOSIS — K5000 Crohn's disease of small intestine without complications: Secondary | ICD-10-CM | POA: Diagnosis not present

## 2024-02-09 ENCOUNTER — Encounter: Payer: Medicaid Other | Admitting: Women's Health

## 2024-02-10 ENCOUNTER — Encounter: Payer: Self-pay | Admitting: Women's Health

## 2024-02-10 ENCOUNTER — Ambulatory Visit: Admitting: Women's Health

## 2024-02-10 VITALS — BP 127/92 | HR 84 | Wt 235.0 lb

## 2024-02-10 DIAGNOSIS — Z3A23 23 weeks gestation of pregnancy: Secondary | ICD-10-CM | POA: Diagnosis not present

## 2024-02-10 DIAGNOSIS — O10912 Unspecified pre-existing hypertension complicating pregnancy, second trimester: Secondary | ICD-10-CM | POA: Diagnosis not present

## 2024-02-10 DIAGNOSIS — Z3480 Encounter for supervision of other normal pregnancy, unspecified trimester: Secondary | ICD-10-CM

## 2024-02-10 DIAGNOSIS — O0992 Supervision of high risk pregnancy, unspecified, second trimester: Secondary | ICD-10-CM | POA: Diagnosis not present

## 2024-02-10 DIAGNOSIS — Z331 Pregnant state, incidental: Secondary | ICD-10-CM

## 2024-02-10 DIAGNOSIS — Z1389 Encounter for screening for other disorder: Secondary | ICD-10-CM | POA: Diagnosis not present

## 2024-02-10 DIAGNOSIS — O10919 Unspecified pre-existing hypertension complicating pregnancy, unspecified trimester: Secondary | ICD-10-CM

## 2024-02-10 LAB — POCT URINALYSIS DIPSTICK OB
Blood, UA: NEGATIVE
Glucose, UA: NEGATIVE
Ketones, UA: NEGATIVE
Leukocytes, UA: NEGATIVE
Nitrite, UA: NEGATIVE

## 2024-02-10 NOTE — Addendum Note (Signed)
 Addended by: Federico Flake A on: 02/10/2024 12:30 PM   Modules accepted: Orders

## 2024-02-10 NOTE — Patient Instructions (Signed)
 Lauren Ramirez, thank you for choosing our office today! We appreciate the opportunity to meet your healthcare needs. You may receive a short survey by mail, e-mail, or through [employer]. If you are happy with your care we would appreciate if you could take just a few minutes to complete the survey questions. We read all of your comments and take your feedback very seriously. Thank you again for choosing our office.  Center for [employer] Team at [facility]  [facility] at [facility] ([address] [place], [place] [address]) Entrance C, located off of [address] Free 24/7 valet parking   You will have your sugar test next visit.  Please do not eat or drink anything after midnight the night before you come, not even water.  You will be here for at least two hours.  Please make an appointment online for the bloodwork at [email or web address] for 8:00am (or as close to this as possible). Make sure you select the [facility] service center.   CLASSES: Go to [email or web address] to register for classes (childbirth, breastfeeding, waterbirth, infant CPR, daddy bootcamp, etc.)  Call the office [phone]) or go to [facility] if: You begin to have strong, frequent contractions Your water breaks.  Sometimes it is a big gush of fluid, sometimes it is just a trickle that keeps getting your panties wet or running down your legs You have vaginal bleeding.  It is normal to have a small amount of spotting if your cervix was checked.  You don't feel your baby moving like normal.  If you don't, get you something to eat and drink and lay down and focus on feeling your baby move.   If your baby is still not moving like normal, you should call the office or go to [facility].  Call the office [phone]) or go to [facility] hospital for these signs of pre-eclampsia: Severe headache that does not go away with Tylenol Visual changes- seeing spots, double, blurred vision Pain under your right breast or upper  abdomen that does not go away with Tums or heartburn medicine Nausea and/or vomiting Severe swelling in your hands, feet, and face    [place] [facility]/Family Doctors [facility]): [address] Colette Ribas, [phone]           [facility]: [address], [phone]                [facility]): [address], [phone]  [facility] Department: [address], [place], [phone]    [facility] Pediatricians/Family Doctors [facility]): [address], [phone] [facility]: [address] [place], [phone] [facility]: [address], [phone]  [facility] Doctors  [facility]): [phone] [facility]: [address] [phone]   [facility] Doctors [facility]: [address] [phone]  [facility] Doctors  [facility]: [phone], [phone]  Home Blood Pressure Monitoring for Patients   Your provider has recommended that you check your blood pressure (BP) at least once a week at home. If you do not have a blood pressure cuff at home, one will be provided for you. Contact your provider if you have not received your monitor within 1 week.   Helpful Tips for Accurate Home Blood Pressure Checks  Don't smoke, exercise, or drink caffeine 30 minutes before checking  your BP Use the restroom before checking your BP (a full bladder can raise your pressure) Relax in a comfortable upright chair Feet on the ground Left arm resting comfortably on a flat surface at the level of your heart Legs uncrossed Back supported Sit quietly and don't talk Place the cuff on your bare arm Adjust snuggly, so that only two fingertips can fit between your skin and the top of the cuff Check 2 readings separated by at least one  minute Keep a log of your BP readings For a visual, please reference this diagram: http://ccnc.care/bpdiagram  Provider Name: [facility]     Phone: [phone]  Zone 1: ALL CLEAR  Continue to monitor your symptoms:  BP reading is less than 140 (top number) or less than 90 (bottom number)  No right upper stomach pain No headaches or seeing spots No feeling nauseated or throwing up No swelling in face and hands  Zone 2: CAUTION Call your doctor's office for any of the following:  BP reading is greater than 140 (top number) or greater than 90 (bottom number)  Stomach pain under your ribs in the middle or right side Headaches or seeing spots Feeling nauseated or throwing up Swelling in face and hands  Zone 3: EMERGENCY  Seek immediate medical care if you have any of the following:  BP reading is greater than160 (top number) or greater than 110 (bottom number) Severe headaches not improving with Tylenol Serious difficulty catching your breath Any worsening symptoms from Zone 2   Second Trimester of Pregnancy The second trimester is from week 13 through week 28, months 4 through 6. The second trimester is often a time when you feel your best. Your body has also adjusted to being pregnant, and you begin to feel better physically. Usually, morning sickness has lessened or quit completely, you may have more energy, and you may have an increase in appetite. The second trimester is also a time when the fetus is growing rapidly. At the end of the sixth month, the fetus is about 9 inches long and weighs about 1 pounds. You will likely begin to feel the baby move (quickening) between 18 and 20 weeks of the pregnancy. BODY CHANGES Your body goes through many changes during pregnancy. The changes vary from woman to woman.  Your weight will continue to increase. You will notice your lower abdomen bulging out. You may begin to get stretch marks on your hips, abdomen, and breasts. You may  develop headaches that can be relieved by medicines approved by your health care provider. You may urinate more often because the fetus is pressing on your bladder. You may develop or continue to have heartburn as a result of your pregnancy. You may develop constipation because certain hormones are causing the muscles that push waste through your intestines to slow down. You may develop hemorrhoids or swollen, bulging veins (varicose veins). You may have back pain because of the weight gain and pregnancy hormones relaxing your joints between the bones in your pelvis and as a result of a shift in weight and the muscles that support your balance. Your breasts will continue to grow and be tender. Your gums may bleed and may be sensitive to brushing and flossing. Dark spots or blotches (chloasma, mask of pregnancy) may develop on your face. This will likely fade after the baby is born. A dark line from your belly button to the pubic area (linea nigra) may appear. This will likely fade after the  baby is born. You may have changes in your hair. These can include thickening of your hair, rapid growth, and changes in texture. Some women also have hair loss during or after pregnancy, or hair that feels dry or thin. Your hair will most likely return to normal after your baby is born. WHAT TO EXPECT AT YOUR PRENATAL VISITS During a routine prenatal visit: You will be weighed to make sure you and the fetus are growing normally. Your blood pressure will be taken. Your abdomen will be measured to track your baby's growth. The fetal heartbeat will be listened to. Any test results from the previous visit will be discussed. Your health care provider may ask you: How you are feeling. If you are feeling the baby move. If you have had any abnormal symptoms, such as leaking fluid, bleeding, severe headaches, or abdominal cramping. If you have any questions. Other tests that may be performed during your second  trimester include: Blood tests that check for: Low iron levels (anemia). Gestational diabetes (between 24 and 28 weeks). Rh antibodies. Urine tests to check for infections, diabetes, or protein in the urine. An ultrasound to confirm the proper growth and development of the baby. An amniocentesis to check for possible genetic problems. Fetal screens for spina bifida and Down syndrome. HOME CARE INSTRUCTIONS  Avoid all smoking, herbs, alcohol, and unprescribed drugs. These chemicals affect the formation and growth of the baby. Follow your health care provider's instructions regarding medicine use. There are medicines that are either safe or unsafe to take during pregnancy. Exercise only as directed by your health care provider. Experiencing uterine cramps is a good sign to stop exercising. Continue to eat regular, healthy meals. Wear a good support bra for breast tenderness. Do not use hot tubs, steam rooms, or saunas. Wear your seat belt at all times when driving. Avoid raw meat, uncooked cheese, cat litter boxes, and soil used by cats. These carry germs that can cause birth defects in the baby. Take your prenatal vitamins. Try taking a stool softener (if your health care provider approves) if you develop constipation. Eat more high-fiber foods, such as fresh vegetables or fruit and whole grains. Drink plenty of fluids to keep your urine clear or pale yellow. Take warm sitz baths to soothe any pain or discomfort caused by hemorrhoids. Use hemorrhoid cream if your health care provider approves. If you develop varicose veins, wear support hose. Elevate your feet for 15 minutes, 3-4 times a day. Limit salt in your diet. Avoid heavy lifting, wear low heel shoes, and practice good posture. Rest with your legs elevated if you have leg cramps or low back pain. Visit your dentist if you have not gone yet during your pregnancy. Use a soft toothbrush to brush your teeth and be gentle when you floss. A  sexual relationship may be continued unless your health care provider directs you otherwise. Continue to go to all your prenatal visits as directed by your health care provider. SEEK MEDICAL CARE IF:  You have dizziness. You have mild pelvic cramps, pelvic pressure, or nagging pain in the abdominal area. You have persistent nausea, vomiting, or diarrhea. You have a bad smelling vaginal discharge. You have pain with urination. SEEK IMMEDIATE MEDICAL CARE IF:  You have a fever. You are leaking fluid from your vagina. You have spotting or bleeding from your vagina. You have severe abdominal cramping or pain. You have rapid weight gain or loss. You have shortness of breath with chest pain. You  notice sudden or extreme swelling of your face, hands, ankles, feet, or legs. You have not felt your baby move in over an hour. You have severe headaches that do not go away with medicine. You have vision changes. Document Released: 11/12/2001 Document Revised: 11/23/2013 Document Reviewed: 01/19/2013 [facility] Patient Information 2015 [place], [place]. This information is not intended to replace advice given to you by your health care provider. Make sure you discuss any questions you have with your health care provider.

## 2024-02-10 NOTE — Progress Notes (Signed)
 HIGH-RISK PREGNANCY VISIT Patient name: Lauren Ramirez MRN [id number]  Date of birth: 02-07-1986 Chief Complaint:   Routine Prenatal Visit  History of Present Illness:   Lauren Ramirez is a 38 y.o. [id number] female at [redacted]w[redacted]d with an Estimated Date of Delivery: 06/02/24 being seen today for ongoing management of a high-risk pregnancy complicated by chronic hypertension currently on no meds (not taking procardia).    Today she reports  home bp's mostly elevated, never started nifedipine b/c bp's had been good at home . Checked sugars sporadically, few elevated, not enough to say has DM- will do GTT @ 27-28w. Contractions: Not present.  .  Movement: Present. denies leaking of fluid.      11/19/2023    3:29 PM 10/09/2023    2:11 PM 07/23/2018   11:56 AM 02/20/2017    9:38 AM 01/20/2017   11:38 AM  Depression screen PHQ 2/9  Decreased Interest 1 0 2 0 0  Down, Depressed, Hopeless 1 0 2 0 0  PHQ - 2 Score 2 0 4 0 0  Altered sleeping 0 0 0    Tired, decreased energy 1 1 3     Change in appetite 0 0 3    Feeling bad or failure about yourself  0 0 0    Trouble concentrating 1 0 0    Moving slowly or fidgety/restless 0 0 0    Suicidal thoughts 0 0 0    PHQ-9 Score 4 1 10           11/19/2023    3:29 PM 10/09/2023    2:11 PM  GAD 7 : Generalized Anxiety Score  Nervous, Anxious, on Edge 0 0  Control/stop worrying 0 0  Worry too much - different things 1 0  Trouble relaxing 0 0  Restless 0 0  Easily annoyed or irritable 1 1  Afraid - awful might happen 0 0  Total GAD 7 Score 2 1     Review of Systems:   Pertinent items are noted in HPI Denies abnormal vaginal discharge w/ itching/odor/irritation, headaches, visual changes, shortness of breath, chest pain, abdominal pain, severe nausea/vomiting, or problems with urination or bowel movements unless otherwise stated above. Pertinent History Reviewed:  Reviewed past medical,surgical, social, obstetrical and family history.   Reviewed problem list, medications and allergies. Physical Assessment:   Vitals:   02/10/24 1020  BP: (!) 127/92  Pulse: 84  Weight: 235 lb (106.6 kg)  Body mass index is 37.93 kg/m.           Physical Examination:   General appearance: alert, well appearing, and in no distress  Mental status: alert, oriented to person, place, and time  Skin: warm & dry   Extremities: Edema: None    Cardiovascular: normal heart rate noted  Respiratory: normal respiratory effort, no distress  Abdomen: gravid, soft, non-tender  Pelvic: Cervical exam deferred         Fetal Status: Fetal Heart Rate (bpm): 146 Fundal Height: 24 cm Movement: Present    Fetal Surveillance Testing today: doppler   Chaperone: N/A  No results found for this or any previous visit (from the past 24 hours).  Assessment & Plan:  High-risk pregnancy: [id number] at [redacted]w[redacted]d with an Estimated Date of Delivery: 06/02/24   1) CHTN, most home bp's elevated, hasn't been taking nifedipine- start nifedipine 30mg  daily as rx'd today  2) Prev vag x2 then c/s, still deciding  3) H/O PTD x 3  Meds: No orders of  the defined types were placed in this encounter.   Labs/procedures today: none  Treatment Plan:    EFW q 4w     2x/wk testing nst/sono @ 32wks     Deliver 37-39.0wks (or as per MFM w/ poor control)____   Reviewed: Preterm labor symptoms and general obstetric precautions including but not limited to vaginal bleeding, contractions, leaking of fluid and fetal movement were reviewed in detail with the patient.  All questions were answered. Does have home bp cuff. Office bp cuff given: not applicable. Check bp daily, let us know if consistently >140 and/or >90.  Follow-up: Return for As scheduled; add PN2 to 28wk visit.   Future Appointments  Date Time Provider Department Center  02/19/2024  3:00 PM [facility] - FT IMG 2 [facility]-FTIMG None  02/19/2024  3:50 PM Lazaro Arms, MD [facility]-FT FTOBGYN  03/17/2024  8:30 AM [facility]-FTOBGYN LAB [facility]  FTOBGYN  03/17/2024  9:15 AM [facility] - FT IMG 2 [facility]FTIMG None  03/17/2024 10:10 AM Cheral Marker, CNM [facility] FTOBGYN  03/22/2024  3:00 PM [facility] - FTOBGYN [place] [facility]FTIMG None  03/22/2024  3:50 PM Myna Hidalgo, DO [facility] FTOBGYN  04/19/2024  3:00 PM [facility] - [facility]TO[facility]YN [place] [facility]FTIMG None  04/19/2024  3:50 PM Lazaro Arms, MD [facility]TO[facility]YN  05/17/2024  3:00 PM [facility] - FTOBGYN [place] [facility]FTIMG None    No orders of the defined types were placed in this encounter.  Cheral Marker CNM, [facility] 02/10/2024 11:13 AM

## 2024-02-17 ENCOUNTER — Other Ambulatory Visit: Payer: Self-pay | Admitting: Obstetrics & Gynecology

## 2024-02-17 DIAGNOSIS — O0993 Supervision of high risk pregnancy, unspecified, third trimester: Secondary | ICD-10-CM

## 2024-02-17 DIAGNOSIS — Z8751 Personal history of pre-term labor: Secondary | ICD-10-CM

## 2024-02-17 DIAGNOSIS — Z3480 Encounter for supervision of other normal pregnancy, unspecified trimester: Secondary | ICD-10-CM

## 2024-02-17 DIAGNOSIS — Z1379 Encounter for other screening for genetic and chromosomal anomalies: Secondary | ICD-10-CM

## 2024-02-17 DIAGNOSIS — Z124 Encounter for screening for malignant neoplasm of cervix: Secondary | ICD-10-CM

## 2024-02-17 DIAGNOSIS — Z98891 History of uterine scar from previous surgery: Secondary | ICD-10-CM

## 2024-02-17 DIAGNOSIS — O09529 Supervision of elderly multigravida, unspecified trimester: Secondary | ICD-10-CM

## 2024-02-17 DIAGNOSIS — O10919 Unspecified pre-existing hypertension complicating pregnancy, unspecified trimester: Secondary | ICD-10-CM

## 2024-02-17 DIAGNOSIS — Z3A16 16 weeks gestation of pregnancy: Secondary | ICD-10-CM

## 2024-02-17 DIAGNOSIS — Z8759 Personal history of other complications of pregnancy, childbirth and the puerperium: Secondary | ICD-10-CM

## 2024-02-19 ENCOUNTER — Ambulatory Visit (INDEPENDENT_AMBULATORY_CARE_PROVIDER_SITE_OTHER): Payer: Medicaid Other | Admitting: Radiology

## 2024-02-19 ENCOUNTER — Ambulatory Visit: Payer: Medicaid Other | Admitting: Obstetrics & Gynecology

## 2024-02-19 ENCOUNTER — Encounter: Payer: Self-pay | Admitting: Obstetrics & Gynecology

## 2024-02-19 VITALS — BP 135/97 | HR 123 | Wt 233.0 lb

## 2024-02-19 DIAGNOSIS — Z98891 History of uterine scar from previous surgery: Secondary | ICD-10-CM

## 2024-02-19 DIAGNOSIS — Z8751 Personal history of pre-term labor: Secondary | ICD-10-CM

## 2024-02-19 DIAGNOSIS — O10912 Unspecified pre-existing hypertension complicating pregnancy, second trimester: Secondary | ICD-10-CM

## 2024-02-19 DIAGNOSIS — Z3A25 25 weeks gestation of pregnancy: Secondary | ICD-10-CM

## 2024-02-19 DIAGNOSIS — Z8759 Personal history of other complications of pregnancy, childbirth and the puerperium: Secondary | ICD-10-CM

## 2024-02-19 DIAGNOSIS — O0992 Supervision of high risk pregnancy, unspecified, second trimester: Secondary | ICD-10-CM | POA: Diagnosis not present

## 2024-02-19 DIAGNOSIS — O10919 Unspecified pre-existing hypertension complicating pregnancy, unspecified trimester: Secondary | ICD-10-CM

## 2024-02-19 DIAGNOSIS — O09522 Supervision of elderly multigravida, second trimester: Secondary | ICD-10-CM

## 2024-02-19 DIAGNOSIS — O09529 Supervision of elderly multigravida, unspecified trimester: Secondary | ICD-10-CM

## 2024-02-19 NOTE — Progress Notes (Signed)
 [place]:  GA = 25+1 weeks Single active female fetus, cephalic, FHR = 164 bpm, posterior pl, gr1 Cardiac screen completed, normal appearing cardiac structures. EFW 29%, 744g     nl ovaries,     CL = 4.7 cm,  closed

## 2024-02-19 NOTE — Progress Notes (Signed)
 HIGH-RISK PREGNANCY VISIT Patient name: Lauren Ramirez MRN [id number]  Date of birth: October 03, 1986 Chief Complaint:   Routine Prenatal Visit  History of Present Illness:   Lauren Ramirez is a 38 y.o. [id number] female at [redacted]w[redacted]d with an Estimated Date of Delivery: 06/02/24 being seen today for ongoing management of a high-risk pregnancy complicated by cHTN on procardia xl 30 mg.    Today she reports no complaints. Contractions: Not present. Vag. Bleeding: None.  Movement: Present. denies leaking of fluid.      11/19/2023    3:29 PM 10/09/2023    2:11 PM 07/23/2018   11:56 AM 02/20/2017    9:38 AM 01/20/2017   11:38 AM  Depression screen PHQ 2/9  Decreased Interest 1 0 2 0 0  Down, Depressed, Hopeless 1 0 2 0 0  PHQ - 2 Score 2 0 4 0 0  Altered sleeping 0 0 0    Tired, decreased energy 1 1 3     Change in appetite 0 0 3    Feeling bad or failure about yourself  0 0 0    Trouble concentrating 1 0 0    Moving slowly or fidgety/restless 0 0 0    Suicidal thoughts 0 0 0    PHQ-9 Score 4 1 10           11/19/2023    3:29 PM 10/09/2023    2:11 PM  GAD 7 : Generalized Anxiety Score  Nervous, Anxious, on Edge 0 0  Control/stop worrying 0 0  Worry too much - different things 1 0  Trouble relaxing 0 0  Restless 0 0  Easily annoyed or irritable 1 1  Afraid - awful might happen 0 0  Total GAD 7 Score 2 1     Review of Systems:   Pertinent items are noted in HPI Denies abnormal vaginal discharge w/ itching/odor/irritation, headaches, visual changes, shortness of breath, chest pain, abdominal pain, severe nausea/vomiting, or problems with urination or bowel movements unless otherwise stated above. Pertinent History Reviewed:  Reviewed past medical,surgical, social, obstetrical and family history.  Reviewed problem list, medications and allergies. Physical Assessment:   Vitals:   02/19/24 1608 02/19/24 1614  BP: (!) 154/101 (!) 135/97  Pulse: 71 (!) 123  Weight: 233 lb  (105.7 kg)   Body mass index is 37.61 kg/m.           Physical Examination:   General appearance: alert, well appearing, and in no distress  Mental status: alert, oriented to person, place, and time  Skin: warm & dry   Extremities:      Cardiovascular: normal heart rate noted  Respiratory: normal respiratory effort, no distress  Abdomen: gravid, soft, non-tender  Pelvic: Cervical exam deferred         Fetal Status:     Movement: Present    Fetal Surveillance Testing today: sonogram EFW 29%  Chaperone: N/A    No results found for this or any previous visit (from the past 24 hours).  Assessment & Plan:  High-risk pregnancy: [id number] at [redacted]w[redacted]d with an Estimated Date of Delivery: 06/02/24      ICD-10-CM   1. Encounter for supervision of high risk pregnancy in second trimester, antepartum  O09.92     2. Chronic hypertension affecting pregnancy  O10.919     3. History of cesarean section, low transverse  Z98.891         Meds: No orders of the defined types were placed in this  encounter.   Orders: No orders of the defined types were placed in this encounter.    Labs/procedures today: U/S  Treatment Plan: see schedule below  Reviewed: Preterm labor symptoms and general obstetric precautions including but not limited to vaginal bleeding, contractions, leaking of fluid and fetal movement were reviewed in detail with the patient.  All questions were answered. Does have home bp cuff. Office bp cuff given: not applicable. Check bp daily, let us know if consistently >150 and/or >95.  Follow-up: Return in about 3 weeks (around 03/11/2024) for keep scheduled.   Future Appointments  Date Time Provider Department Center  03/17/2024  8:30 AM [facility]-FTOBGYN LAB [facility] FTOBGYN  03/17/2024  9:15 AM [facility] - FT IMG 2 [facility]FTIMG None  03/17/2024 10:10 AM Cheral Marker, CNM [facility]FT FTOBGYN  04/08/2024  3:30 PM [facility]-FTOBGYN NURSE [facility] FTOBGYN  04/08/2024  3:50 PM Jacklyn Shell, CNM [facility]-FT  FTOBGYN  04/12/2024  3:00 PM [facility] - FTOBGYN [place] [facility]-FTIMG None  04/12/2024  3:50 PM Cheral Marker, CNM [facility] FTOBGYN  04/15/2024  3:30 PM [facility]-FTOBGYN NURSE [facility] FTOBGYN  04/19/2024  3:00 PM [facility] - FT IMG 2 [facility]-FTIMG None  04/19/2024  3:50 PM Cheral Marker, CNM [facility] FTOBGYN  04/22/2024  3:30 PM [facility]FTOBGYN NURSE [facility] FTOBGYN  04/29/2024  3:30 PM [facility]FTOBGYN NURSE [facility] FTO[facility]YN  05/03/2024  3:00 PM [facility] - FTOBGYN [place] [facility]FTIMG None  05/06/2024  3:30 PM [facility]-FTOBGYN NURSE [facility] FTOBGYN  05/10/2024  3:00 PM [facility] - FT IMG 2 [facility]-FTIMG None  05/13/2024  3:30 PM [facility]-FTOBGYN NURSE [facility]FT FTOBGYN  05/17/2024  3:00 PM [facility] - FTO[facility]YN [place] [facility]FTIMG None  05/20/2024  9:10 AM [facility]-FTOBGYN NURSE [facility] FTOBGYN  05/24/2024  3:00 PM [facility] - FTOBGYN [place] [facility]-FTIMG None  05/27/2024  3:30 PM [facility]-FTOBGYN NURSE [facility]FT FTOBGYN  05/31/2024  3:00 PM [facility] - FTOBGYN [place] [facility]FTIMG None    No orders of the defined types were placed in this encounter.  Lazaro Arms  Attending Physician for the Center for [facility] 02/22/2024 9:52 PM

## 2024-03-17 ENCOUNTER — Other Ambulatory Visit: Admitting: Radiology

## 2024-03-17 ENCOUNTER — Encounter: Admitting: Women's Health

## 2024-03-17 ENCOUNTER — Other Ambulatory Visit

## 2024-03-17 DIAGNOSIS — Z3480 Encounter for supervision of other normal pregnancy, unspecified trimester: Secondary | ICD-10-CM

## 2024-03-17 DIAGNOSIS — Z3A28 28 weeks gestation of pregnancy: Secondary | ICD-10-CM

## 2024-03-17 DIAGNOSIS — Z131 Encounter for screening for diabetes mellitus: Secondary | ICD-10-CM

## 2024-03-22 ENCOUNTER — Other Ambulatory Visit: Payer: Medicaid Other

## 2024-03-22 ENCOUNTER — Encounter: Payer: Medicaid Other | Admitting: Obstetrics & Gynecology

## 2024-03-22 ENCOUNTER — Ambulatory Visit: Admitting: Obstetrics & Gynecology

## 2024-03-22 ENCOUNTER — Ambulatory Visit (INDEPENDENT_AMBULATORY_CARE_PROVIDER_SITE_OTHER): Admitting: Radiology

## 2024-03-22 ENCOUNTER — Encounter: Payer: Self-pay | Admitting: Obstetrics & Gynecology

## 2024-03-22 VITALS — BP 140/88 | HR 123 | Wt 235.0 lb

## 2024-03-22 DIAGNOSIS — O10913 Unspecified pre-existing hypertension complicating pregnancy, third trimester: Secondary | ICD-10-CM

## 2024-03-22 DIAGNOSIS — Z8751 Personal history of pre-term labor: Secondary | ICD-10-CM

## 2024-03-22 DIAGNOSIS — Z98891 History of uterine scar from previous surgery: Secondary | ICD-10-CM | POA: Diagnosis not present

## 2024-03-22 DIAGNOSIS — O09523 Supervision of elderly multigravida, third trimester: Secondary | ICD-10-CM | POA: Diagnosis not present

## 2024-03-22 DIAGNOSIS — Z3A29 29 weeks gestation of pregnancy: Secondary | ICD-10-CM

## 2024-03-22 DIAGNOSIS — O09529 Supervision of elderly multigravida, unspecified trimester: Secondary | ICD-10-CM

## 2024-03-22 DIAGNOSIS — O10919 Unspecified pre-existing hypertension complicating pregnancy, unspecified trimester: Secondary | ICD-10-CM

## 2024-03-22 DIAGNOSIS — Z8759 Personal history of other complications of pregnancy, childbirth and the puerperium: Secondary | ICD-10-CM

## 2024-03-22 DIAGNOSIS — O0993 Supervision of high risk pregnancy, unspecified, third trimester: Secondary | ICD-10-CM | POA: Diagnosis not present

## 2024-03-22 NOTE — Progress Notes (Signed)
 [place] : GA = 29+5 weeks Single active female fetus, cephalic, FHR = 153 bpm, AFI = 14.5 cm,  50%, MVP = 4.4 cm EFW 40%, 1460g

## 2024-03-22 NOTE — Progress Notes (Signed)
 HIGH-RISK PREGNANCY VISIT Patient name: Lauren Ramirez MRN [id number]  Date of birth: [birth year] Chief Complaint:   Routine Prenatal Visit  History of Present Illness:   Lauren Ramirez is a 37 y.o. [id number] female at [redacted]w[redacted]d with an Estimated Date of Delivery: 06/02/24 being seen today for ongoing management of a high-risk pregnancy complicated by:  -Chronic HTN Not taking procardia - would prefer to avoid meds Unable to take ASA due to Crohn's  -h/o Crohn's -prior C-section  Today she reports no complaints.   Contractions: Not present. Vag. Bleeding: None.  Movement: Present. denies leaking of fluid.      11/19/2023    3:29 PM 10/09/2023    2:11 PM 07/23/2018   11:56 AM 02/20/2017    9:38 AM 01/20/2017   11:38 AM  Depression screen PHQ 2/9  Decreased Interest 1 0 2 0 0  Down, Depressed, Hopeless 1 0 2 0 0  PHQ - 2 Score 2 0 4 0 0  Altered sleeping 0 0 0    Tired, decreased energy 1 1 3     Change in appetite 0 0 3    Feeling bad or failure about yourself  0 0 0    Trouble concentrating 1 0 0    Moving slowly or fidgety/restless 0 0 0    Suicidal thoughts 0 0 0    PHQ-9 Score 4 1 10        Current Outpatient Medications  Medication Instructions   Accu-Chek Softclix Lancets lancets Use as instructed to check blood sugar 4 times daily   acetaminophen  (TYLENOL ) 500 mg, Every 6 hours PRN   aspirin  EC 81 mg, Oral, Daily, Swallow whole.   Blood Glucose Monitoring Suppl (ACCU-CHEK GUIDE ME) w/Device KIT 1 each, Does not apply, 4 times daily   cetirizine  (ZYRTEC  ALLERGY) 10 mg, Oral, Daily   cyanocobalamin (VITAMIN B12) 1000 MCG/ML injection Inject into the muscle.   fluticasone  (FLONASE ) 50 MCG/ACT nasal spray 1 spray, Each Nare, Daily   glucose blood test strip Use as instructed to check blood sugar four times daily   Multiple Vitamin (MULTIVITAMIN) tablet 1 tablet, Daily   NIFEdipine  (PROCARDIA  XL) 30 mg, Oral, Daily   ondansetron  (ZOFRAN -ODT) 4 mg, Oral, Every 8  hours PRN   progesterone  (PROMETRIUM ) 200 mg, Vaginal, Daily at bedtime     Review of Systems:   Pertinent items are noted in HPI Denies abnormal vaginal discharge w/ itching/odor/irritation, headaches, visual changes, shortness of breath, chest pain, abdominal pain, severe nausea/vomiting, or problems with urination or bowel movements unless otherwise stated above. Pertinent History Reviewed:  Reviewed past medical,surgical, social, obstetrical and family history.  Reviewed problem list, medications and allergies. Physical Assessment:   Vitals:   03/22/24 1503 03/22/24 1524  BP: (!) 146/100 (!) 140/88  Pulse: (!) 123   Weight: 235 lb (106.6 kg)   Body mass index is 37.93 kg/m.           Physical Examination:   General appearance: alert, well appearing, and in no distress  Mental status: normal mood, behavior, speech, dress, motor activity, and thought processes  Skin: warm & dry   Extremities:      Cardiovascular: normal heart rate noted  Respiratory: normal respiratory effort, no distress  Abdomen: gravid, soft, non-tender  Pelvic: Cervical exam deferred         Fetal Status:     Movement: Present    Fetal Surveillance Testing today: Single active female fetus, cephalic, FHR = 153 bpm, AFI =  14.5 cm,  50%, MVP = 4.4 cm EFW 40%, 1460g    Chaperone: N/A    No results found for this or any previous visit (from the past 24 hours).   Assessment & Plan:  High-risk pregnancy: [id number] at [redacted]w[redacted]d with an Estimated Date of Delivery: 06/02/24   1. Supervision of high risk pregnancy in third trimester (Primary)   2. Chronic hypertension affecting pregnancy -1wk for BP check -reviewed the "why" of HTN treatment including risk of preeclampsia and other related complications.  Reviewed increased M&M and ways in which we can try to improve this -plan for growth every 4wks -antepartum testing starting @ 32wks []  BP check in 1wk  3. History of cesarean section, low transverse -VBAC  consent obtained today - We discussed her history of c-section. Her previous c-section was due to  fetal malpresentation of twins  She has a history of   successful VBAC @ 21wks - We discussed the risks associated with repeat c-section: bleeding, infection, injury to surrounding organs/tissues I.e. bowel/bladder, development of scar tissue, wound complications such as wound separation or infection, need for additional surgery and discussed potential for future complications such as placenta accreta spectrum  nm- We discussed the risks associated with TOLAC specifically focusing on the risk of uterine rupture. We discussed with the risk of uterine rupture that while rare it is not easily predicted, that it is a surgical emergency, and it can be potentially catastrophic for mom and baby.  - After counseling, the patient was given the opportunity to ask questions and all questions answered.  - After considering her options, she would like to [name] - Information provided to the patient  4) Contraceptive management -discussed permanent sterilization via salpingectomy.  She notes that she is concerned about having another surgery.  At this time she is not sure how she wishes to proceed - Briefly discussed all options including pill, Depo, Nexplanon, IUD.  Partner present and also discussed vasectomy, which she did not seem interested at this time []  Plan to review at next visit  Meds: No orders of the defined types were placed in this encounter.   Labs/procedures today: growth scan  Treatment Plan:  as outlined above  Reviewed: Preterm labor symptoms and general obstetric precautions including but not limited to vaginal bleeding, contractions, leaking of fluid and fetal movement were reviewed in detail with the patient.  All questions were answered. Pt has home bp cuff. Check bp weekly, let us  know if >140/90.   Follow-up: Return in about 2 weeks (around 04/05/2024) for as scheduled twice weekly visit  @ 32wks, 1wk BP check RN.   Future Appointments  Date Time Provider Department Center  03/24/2024  8:30 AM [facility]  03/29/2024 11:30 AM [facility] NURSE [facility]  04/08/2024  3:30 PM [facility] NURSE [facility]  04/08/2024  3:50 PM Cresenzo-Dishmon, Blanca Bunch, CNM [facility]  04/12/2024  3:00 PM [facility] US  [facility] None  04/12/2024  3:50 PM Ferd Householder, CNM [facility]  04/15/2024  3:30 PM [facility] NURSE [facility]  04/19/2024  3:00 PM [facility]G 2 [facility] None  04/19/2024  3:50 PM Ferd Householder, CNM [facility]  04/22/2024  3:30 PM [facility] NURSE [facility]  04/29/2024  3:30 PM [facility] NURSE [facility]  04/30/2024  9:15 AM [facility] US  [facility] None  04/30/2024 10:10 AM Wendelyn Halter, MD [facility]  05/03/2024  3:00 PM [facility] US  [facility] None  05/06/2024  3:30 PM [facility] NURSE [facility]  05/10/2024  3:00 PM [facility] IMG 2 [facility] None  05/13/2024  3:30 PM [facility] NURSE [facility]  05/17/2024  3:00 PM [facility] US  [facility] None  05/20/2024  9:10 AM [facility] NURSE [facility]  05/24/2024  3:00 PM [facility] US  [facility] None  05/27/2024  3:30 PM [facility] NURSE [facility]  05/31/2024  3:00 PM [facility] US  [facility] None    No orders of the defined types were placed in this encounter.   Shakeila Pfarr, DO Attending Obstetrician & Gynecologist, [facility] for [employer], [facility]

## 2024-03-24 ENCOUNTER — Other Ambulatory Visit

## 2024-03-25 ENCOUNTER — Other Ambulatory Visit

## 2024-03-29 ENCOUNTER — Telehealth: Admitting: *Deleted

## 2024-03-29 ENCOUNTER — Encounter: Payer: Self-pay | Admitting: *Deleted

## 2024-03-29 VITALS — BP 132/91 | HR 90

## 2024-03-29 DIAGNOSIS — Z0130 Encounter for examination of blood pressure without abnormal findings: Secondary | ICD-10-CM

## 2024-03-29 NOTE — Progress Notes (Signed)
   NURSE VISIT- BLOOD PRESSURE CHECK  I connected with Glennda Langton on 03/29/2024 by telephone  and verified that I am speaking with the correct person using two identifiers.   I discussed the limitations of evaluation and management by telemedicine. The patient expressed understanding and agreed to proceed.  Nurse is at the office, and patient is at home.  SUBJECTIVE:  Lauren Ramirez is a 38 y.o. [phone] female here for BP check. She is [redacted]w[redacted]d pregnant    HYPERTENSION ROS:  Pregnant/postpartum:  Severe headaches that don't go away with tylenol /other medicines: No  Visual changes (seeing spots/double/blurred vision) No  Severe pain under right breast breast or in center of upper chest No  Severe nausea/vomiting No  Taking medicines as instructed no    OBJECTIVE:  BP (!) 131/91   Pulse 84   LMP 08/27/2023   Appearance alert, well appearing, and in no distress.  ASSESSMENT: Pregnancy [redacted]w[redacted]d  blood pressure check  PLAN: Discussed with Dr. Ozan   Recommendations:  Pt encouraged to take medication.     Follow-up: as scheduled   Kerrie Peek  03/29/2024 11:51 AM

## 2024-03-30 ENCOUNTER — Encounter: Payer: Self-pay | Admitting: Obstetrics & Gynecology

## 2024-03-30 ENCOUNTER — Other Ambulatory Visit: Payer: Self-pay | Admitting: Adult Health

## 2024-04-08 ENCOUNTER — Ambulatory Visit: Admitting: Advanced Practice Midwife

## 2024-04-08 ENCOUNTER — Encounter: Payer: Self-pay | Admitting: Advanced Practice Midwife

## 2024-04-08 ENCOUNTER — Other Ambulatory Visit

## 2024-04-08 VITALS — BP 131/86 | HR 114 | Wt 235.0 lb

## 2024-04-08 DIAGNOSIS — Z8751 Personal history of pre-term labor: Secondary | ICD-10-CM | POA: Diagnosis not present

## 2024-04-08 DIAGNOSIS — Z6836 Body mass index (BMI) 36.0-36.9, adult: Secondary | ICD-10-CM | POA: Diagnosis not present

## 2024-04-08 DIAGNOSIS — O10919 Unspecified pre-existing hypertension complicating pregnancy, unspecified trimester: Secondary | ICD-10-CM

## 2024-04-08 DIAGNOSIS — Z98891 History of uterine scar from previous surgery: Secondary | ICD-10-CM

## 2024-04-08 DIAGNOSIS — O10913 Unspecified pre-existing hypertension complicating pregnancy, third trimester: Secondary | ICD-10-CM

## 2024-04-08 DIAGNOSIS — O0993 Supervision of high risk pregnancy, unspecified, third trimester: Secondary | ICD-10-CM

## 2024-04-08 DIAGNOSIS — Z3A37 37 weeks gestation of pregnancy: Secondary | ICD-10-CM

## 2024-04-08 DIAGNOSIS — Z3A32 32 weeks gestation of pregnancy: Secondary | ICD-10-CM

## 2024-04-08 DIAGNOSIS — Z3480 Encounter for supervision of other normal pregnancy, unspecified trimester: Secondary | ICD-10-CM

## 2024-04-08 DIAGNOSIS — Z131 Encounter for screening for diabetes mellitus: Secondary | ICD-10-CM

## 2024-04-08 NOTE — Patient Instructions (Signed)
 Glennda Langton, I greatly value your feedback.  If you receive a survey following your visit with us  today, we appreciate you taking the time to fill it out.  Thanks, Monda Angry, DNP, CNM  [facility] HAS MOVED!!! It is now [facility] at [employer] ([address] [place], [place] [address]) Entrance located off of [address] Free 24/7 valet parking   Go to [email or web address] to register for FREE online childbirth classes    Call the office [phone]) or go to [facility] if: You begin to have strong, frequent contractions Your water breaks.  Sometimes it is a big gush of fluid, sometimes it is just a trickle that keeps getting your panties wet or running down your legs You have vaginal bleeding.  It is normal to have a small amount of spotting if your cervix was checked.  You don't feel your baby moving like normal.  If you don't, get you something to eat and drink and lay down and focus on feeling your baby move.  You should feel at least 10 movements in 2 hours.  If you don't, you should call the office or go to [facility].   Home Blood Pressure Monitoring for Patients   Your provider has recommended that you check your blood pressure (BP) at least once a week at home. If you do not have a blood pressure cuff at home, one will be provided for you. Contact your provider if you have not received your monitor within 1 week.   Helpful Tips for Accurate Home Blood Pressure Checks  Don't smoke, exercise, or drink caffeine 30 minutes before checking your BP Use the restroom before checking your BP (a full bladder can raise your pressure) Relax in a comfortable upright chair Feet on the ground Left arm resting comfortably on a flat surface at the level of your heart Legs uncrossed Back supported Sit quietly and don't talk Place the cuff on your bare arm Adjust snuggly, so that only two fingertips can fit between your skin and  the top of the cuff Check 2 readings separated by at least one minute Keep a log of your BP readings For a visual, please reference this diagram: http://ccnc.care/bpdiagram  Provider Name: [facility]     Phone: [phone]  Zone 1: ALL CLEAR  Continue to monitor your symptoms:  BP reading is less than 140 (top number) or less than 90 (bottom number)  No right upper stomach pain No headaches or seeing spots No feeling nauseated or throwing up No swelling in face and hands  Zone 2: CAUTION Call your doctor's office for any of the following:  BP reading is greater than 140 (top number) or greater than 90 (bottom number)  Stomach pain under your ribs in the middle or right side Headaches or seeing spots Feeling nauseated or throwing up Swelling in face and hands  Zone 3: EMERGENCY  Seek immediate medical care if you have any of the following:  BP reading is greater than160 (top number) or greater than 110 (bottom number) Severe headaches not improving with Tylenol  Serious difficulty catching your breath Any worsening symptoms from Zone 2

## 2024-04-08 NOTE — Progress Notes (Signed)
 HIGH-RISK PREGNANCY VISIT Patient name: Lauren Ramirez MRN [id number]  Date of birth: 1986/03/20 Chief Complaint:   Routine Prenatal Visit and Non-stress Test (PN2)  History of Present Illness:   Lauren Ramirez is a 38 y.o. [phone] female at [redacted]w[redacted]d with an Estimated Date of Delivery: 06/02/24 being seen today for ongoing management of a high-risk pregnancy complicated by chronic hypertension currently on no meds, although rx'd procardia  30xl (pt would prefer not to take med).  BPs at home are <100 diastolic. Takes it daily.   Today she reports no complaints. Contractions: Not present.  .  Movement: Present. denies leaking of fluid.      04/08/2024    9:43 AM 11/19/2023    3:29 PM 10/09/2023    2:11 PM 07/23/2018   11:56 AM 02/20/2017    9:38 AM  Depression screen PHQ 2/9  Decreased Interest 0 1 0 2 0  Down, Depressed, Hopeless 0 1 0 2 0  PHQ - 2 Score 0 2 0 4 0  Altered sleeping 0 0 0 0   Tired, decreased energy 1 1 1 3    Change in appetite 0 0 0 3   Feeling bad or failure about yourself  0 0 0 0   Trouble concentrating 0 1 0 0   Moving slowly or fidgety/restless 0 0 0 0   Suicidal thoughts 0 0 0 0   PHQ-9 Score 1 4 1 10          11/19/2023    3:29 PM 10/09/2023    2:11 PM  GAD 7 : Generalized Anxiety Score  Nervous, Anxious, on Edge 0 0  Control/stop worrying 0 0  Worry too much - different things 1 0  Trouble relaxing 0 0  Restless 0 0  Easily annoyed or irritable 1 1  Afraid - awful might happen 0 0  Total GAD 7 Score 2 1     Review of Systems:   Pertinent items are noted in HPI Denies abnormal vaginal discharge w/ itching/odor/irritation, headaches, visual changes, shortness of breath, chest pain, abdominal pain, severe nausea/vomiting, or problems with urination or bowel movements unless otherwise stated above. Pertinent History Reviewed:  Reviewed past medical,surgical, social, obstetrical and family history.  Reviewed problem list, medications and  allergies. Physical Assessment:   Vitals:   04/08/24 0940 04/08/24 0950  BP: (!) 145/90 131/86  Pulse: (!) 114 (!) 114  Weight: 235 lb (106.6 kg)   Body mass index is 37.93 kg/m.           Physical Examination:   General appearance: alert, well appearing, and in no distress  Mental status: alert, oriented to person, place, and time  Skin: warm & dry   Extremities: Edema: None    Cardiovascular: normal heart rate noted  Respiratory: normal respiratory effort, no distress  Abdomen: gravid, soft, non-tender  Pelvic: Cervical exam deferred         Chaperone: N/A    Fetal Status:     Movement: Present    Fetal Surveillance Testing today: NST: FHR baseline 140 bpm, Variability: moderate, Accelerations:present, Decelerations:  Absent= Cat 1/Reactive      No results found for this or any previous visit (from the past 24 hours).  Assessment & Plan:  High-risk pregnancy: [id number] at [redacted]w[redacted]d with an Estimated Date of Delivery: 06/02/24   1. Supervision of other normal pregnancy, antepartum (Primary)   2. [redacted] weeks gestation of pregnancy   3. BMI 36.0-36.9,adult   4. History  of preterm delivery Stopped prometrium    5. History of cesarean section, low transverse Plans TOLAC  6. Chronic hypertension affecting pregnancy Although not on meds, will continue w/previously scheduled testing.     Meds: No orders of the defined types were placed in this encounter.   Orders: No orders of the defined types were placed in this encounter.    Labs/procedures today: NST   Reviewed: Term labor symptoms and general obstetric precautions including but not limited to vaginal bleeding, contractions, leaking of fluid and fetal movement were reviewed in detail with the patient.  All questions were answered. Does have home bp cuff. Office bp cuff given: not applicable. Check bp daily, let us  know if consistently >150 and/or >95.  Follow-up: No follow-ups on file.   Future Appointments   Date Time Provider Department Center  04/08/2024 10:10 AM Majel Scott, CNM [facility]  04/12/2024  3:00 PM [facility] US  [facility] None  04/12/2024  3:50 PM Ferd Householder, CNM [facility]  04/15/2024 11:10 AM [facility] NURSE [facility]  04/19/2024 11:30 AM [facility] US  [facility] None  04/19/2024  1:30 PM Wendelyn Halter, MD [facility]  04/22/2024 10:50 AM [facility] NURSE [facility]  04/27/2024 11:30 AM [facility] NURSE [facility]  04/30/2024  9:15 AM [facility] US  [facility] None  04/30/2024 10:10 AM Wendelyn Halter, MD [facility]  05/03/2024  9:15 AM [facility] IMG 2 [facility] None  05/03/2024 10:10 AM Keene Pastures, DO [facility]  05/06/2024 10:50 AM [facility] NURSE [facility]  05/10/2024  3:00 PM [facility] FT IMG 2 [facility] None  05/10/2024  3:50 PM Ferd Householder, CNM [facility]  05/13/2024 10:50 AM [facility] NURSE [facility]  05/17/2024 10:00 AM [facility] IMG 2 [facility] None  05/17/2024 10:50 AM Keene Pastures, DO [facility]  05/20/2024  9:10 AM [facility] NURSE [facility]  05/24/2024  9:15 AM [facility] US  [facility] None  05/24/2024 10:10 AM Keene Pastures, DO [facility]  05/27/2024 10:50 AM [facility] NURSE [facility]  05/31/2024  9:15 AM [facility] US  [facility] None  05/31/2024 10:10 AM Wendelyn Halter, MD [facility]    No orders of the defined types were placed in this encounter.  Majel Scott , DNP, CNM [facility] 04/08/2024 9:51 AM

## 2024-04-10 LAB — CBC
Hematocrit: 35.8 % (ref 34.0–46.6)
Hemoglobin: 12 g/dL (ref 11.1–15.9)
MCH: 28.2 pg (ref 26.6–33.0)
MCHC: 33.5 g/dL (ref 31.5–35.7)
MCV: 84 fL (ref 79–97)
Platelets: 264 10*3/uL (ref 150–450)
RBC: 4.25 x10E6/uL (ref 3.77–5.28)
RDW: 13.9 % (ref 11.7–15.4)
WBC: 9.3 10*3/uL (ref 3.4–10.8)

## 2024-04-10 LAB — ANTIBODY SCREEN: Antibody Screen: NEGATIVE

## 2024-04-10 LAB — GLUCOSE TOLERANCE, 2 HOURS W/ 1HR
Glucose, 1 hour: 150 mg/dL (ref 70–179)
Glucose, 2 hour: 145 mg/dL (ref 70–152)
Glucose, Fasting: 87 mg/dL (ref 70–91)

## 2024-04-10 LAB — HIV ANTIBODY (ROUTINE TESTING W REFLEX)

## 2024-04-10 LAB — RPR: RPR Ser Ql: NONREACTIVE

## 2024-04-12 ENCOUNTER — Encounter: Payer: Self-pay | Admitting: Women's Health

## 2024-04-12 ENCOUNTER — Ambulatory Visit (INDEPENDENT_AMBULATORY_CARE_PROVIDER_SITE_OTHER)

## 2024-04-12 ENCOUNTER — Ambulatory Visit (INDEPENDENT_AMBULATORY_CARE_PROVIDER_SITE_OTHER): Admitting: Women's Health

## 2024-04-12 VITALS — BP 133/88 | HR 94 | Wt 238.0 lb

## 2024-04-12 DIAGNOSIS — Z98891 History of uterine scar from previous surgery: Secondary | ICD-10-CM

## 2024-04-12 DIAGNOSIS — O10919 Unspecified pre-existing hypertension complicating pregnancy, unspecified trimester: Secondary | ICD-10-CM

## 2024-04-12 DIAGNOSIS — Z3A32 32 weeks gestation of pregnancy: Secondary | ICD-10-CM | POA: Diagnosis not present

## 2024-04-12 DIAGNOSIS — O10913 Unspecified pre-existing hypertension complicating pregnancy, third trimester: Secondary | ICD-10-CM | POA: Diagnosis not present

## 2024-04-12 DIAGNOSIS — O09523 Supervision of elderly multigravida, third trimester: Secondary | ICD-10-CM | POA: Diagnosis not present

## 2024-04-12 DIAGNOSIS — Z3480 Encounter for supervision of other normal pregnancy, unspecified trimester: Secondary | ICD-10-CM

## 2024-04-12 DIAGNOSIS — Z8751 Personal history of pre-term labor: Secondary | ICD-10-CM

## 2024-04-12 DIAGNOSIS — Z8759 Personal history of other complications of pregnancy, childbirth and the puerperium: Secondary | ICD-10-CM | POA: Diagnosis not present

## 2024-04-12 DIAGNOSIS — O09529 Supervision of elderly multigravida, unspecified trimester: Secondary | ICD-10-CM

## 2024-04-12 DIAGNOSIS — O0990 Supervision of high risk pregnancy, unspecified, unspecified trimester: Secondary | ICD-10-CM

## 2024-04-12 DIAGNOSIS — O0993 Supervision of high risk pregnancy, unspecified, third trimester: Secondary | ICD-10-CM | POA: Diagnosis not present

## 2024-04-12 LAB — POCT URINALYSIS DIPSTICK OB
Blood, UA: NEGATIVE
Glucose, UA: NEGATIVE
Ketones, UA: NEGATIVE
Leukocytes, UA: NEGATIVE
Nitrite, UA: NEGATIVE
POC,PROTEIN,UA: NEGATIVE

## 2024-04-12 NOTE — Progress Notes (Signed)
 HIGH-RISK PREGNANCY VISIT Patient name: Lauren Ramirez MRN [id number]  Date of birth: 1986/07/07 Chief Complaint:   Routine Prenatal Visit  History of Present Illness:   Lauren Ramirez is a 38 y.o. [id number] female at [redacted]w[redacted]d with an Estimated Date of Delivery: 06/02/24 being seen today for ongoing management of a high-risk pregnancy complicated by chronic hypertension currently on nifedipine  30mg  (not taking) and PG BMI 36.    Today she reports not taking nifedipine  d/t fear r/t h/o IUFD, nl home bp's daily. May want BTS, not sure. Contractions: Not present. Vag. Bleeding: None.  Movement: Present. denies leaking of fluid.      04/08/2024    9:43 AM 11/19/2023    3:29 PM 10/09/2023    2:11 PM 07/23/2018   11:56 AM 02/20/2017    9:38 AM  Depression screen PHQ 2/9  Decreased Interest 0 1 0 2 0  Down, Depressed, Hopeless 0 1 0 2 0  PHQ - 2 Score 0 2 0 4 0  Altered sleeping 0 0 0 0   Tired, decreased energy 1 1 1 3    Change in appetite 0 0 0 3   Feeling bad or failure about yourself  0 0 0 0   Trouble concentrating 0 1 0 0   Moving slowly or fidgety/restless 0 0 0 0   Suicidal thoughts 0 0 0 0   PHQ-9 Score 1 4 1 10          11/19/2023    3:29 PM 10/09/2023    2:11 PM  GAD 7 : Generalized Anxiety Score  Nervous, Anxious, on Edge 0 0  Control/stop worrying 0 0  Worry too much - different things 1 0  Trouble relaxing 0 0  Restless 0 0  Easily annoyed or irritable 1 1  Afraid - awful might happen 0 0  Total GAD 7 Score 2 1     Review of Systems:   Pertinent items are noted in HPI Denies abnormal vaginal discharge w/ itching/odor/irritation, headaches, visual changes, shortness of breath, chest pain, abdominal pain, severe nausea/vomiting, or problems with urination or bowel movements unless otherwise stated above. Pertinent History Reviewed:  Reviewed past medical,surgical, social, obstetrical and family history.  Reviewed problem list, medications and  allergies. Physical Assessment:   Vitals:   04/12/24 1531  BP: 133/88  Pulse: 94  Weight: 238 lb (108 kg)  Body mass index is 38.41 kg/m.           Physical Examination:   General appearance: alert, well appearing, and in no distress  Mental status: alert, oriented to person, place, and time  Skin: warm & dry   Extremities:      Cardiovascular: normal heart rate noted  Respiratory: normal respiratory effort, no distress  Abdomen: gravid, soft, non-tender  Pelvic: Cervical exam deferred         Fetal Status:     Movement: Present    Fetal Surveillance Testing today: US  32+5 wks,cephalic,FHR 154 bpm,CX 4.3 cm,posterior placenta gr 3,AFI 14 cm,RI .53,.59,.56=25%,EFW 2040 g 41%,BPP 8/8   Chaperone: N/A  Results for orders placed or performed in visit on 04/12/24 (from the past 24 hours)  POC Urinalysis Dipstick OB   Collection Time: 04/12/24  3:39 PM  Result Value Ref Range   Color, UA     Clarity, UA     Glucose, UA Negative Negative   Bilirubin, UA     Ketones, UA neg    Spec Grav, UA  Blood, UA neg    pH, UA     POC,PROTEIN,UA Negative Negative, Trace, Small (1+), Moderate (2+), Large (3+), 4+   Urobilinogen, UA     Nitrite, UA neg    Leukocytes, UA Negative Negative   Appearance     Odor      Assessment & Plan:  High-risk pregnancy: [id number] at [redacted]w[redacted]d with an Estimated Date of Delivery: 06/02/24   1) CHTN, not taking nifedipine  30mg  daily (d/t fear r/t h/o IUFD), home bp's 130s/80s, continue to check daily, if >140/90 or pre-e s/s, let us  know  2) Prev c/s, vag x 2 then C/S w/ twins, then 21wk breech VBAC/IUFD> plans TOLAC, consent signed 4/21  3) H/O PTB x 3> 32, 36, 35wks, stopped prometrium , reviewed ptl s/s, reasons to seek care  4) May want BTS>reviewed risks/benefits, LARCs just as effective, consent signed today   5) H/O abnormal pap> colpo hasn't been done, plan for 8wks pp  Meds: No orders of the defined types were placed in this  encounter.   Labs/procedures today: U/S  Treatment Plan:  EFW q 4w     2x/wk testing nst/sono   Deliver 37-39.0wks (or as per MFM w/ poor control)____   Reviewed: Preterm labor symptoms and general obstetric precautions including but not limited to vaginal bleeding, contractions, leaking of fluid and fetal movement were reviewed in detail with the patient.  All questions were answered. Does have home bp cuff. Office bp cuff given: not applicable. Check bp daily, let us  know if consistently >140 and/or >90.  Follow-up: Return for As scheduled.   Future Appointments  Date Time Provider Department Center  04/15/2024 11:10 AM [facility] NURSE [facility]  04/19/2024 11:30 AM [facility] US  [facility] None  04/19/2024  1:30 PM Wendelyn Halter, MD [facility]  04/22/2024 10:50 AM [facility] NURSE [facility]  04/27/2024 11:30 AM [facility] NURSE [facility]  04/30/2024  9:15 AM [facility] US  [facility] None  04/30/2024 10:10 AM Wendelyn Halter, MD [facility]  05/03/2024  9:15 AM [facility] IMG 2 [facility] None  05/03/2024 10:10 AM Keene Pastures, DO [facility]  05/06/2024 10:50 AM [facility] NURSE [facility]  05/10/2024 10:45 AM [facility] IMG 2 [facility] None  05/10/2024 11:30 AM Ferd Householder, CNM [facility]  05/13/2024 10:50 AM [facility] NURSE [facility]  05/17/2024 10:00 AM [facility]G 2 [facility] None  05/17/2024 10:50 AM Keene Pastures, DO [facility]  05/20/2024  9:10 AM [facility] NURSE [facility]  05/24/2024  9:15 AM [facility] US  [facility] None  05/24/2024 10:10 AM Keene Pastures, DO [facility]  05/27/2024 10:50 AM [facility] NURSE [facility]  05/31/2024  9:15 AM [facility] US  [facility] None  05/31/2024 10:10 AM Wendelyn Halter, MD [facility]    Orders Placed This Encounter  Procedures   POC Urinalysis Dipstick OB   Ferd Householder CNM, [facility] 04/12/2024 4:15 PM

## 2024-04-12 NOTE — Patient Instructions (Signed)
 Lauren Ramirez, thank you for choosing our office today! We appreciate the opportunity to meet your healthcare needs. You may receive a short survey by mail, e-mail, or through [employer]. If you are happy with your care we would appreciate if you could take just a few minutes to complete the survey questions. We read all of your comments and take your feedback very seriously. Thank you again for choosing our office.  Center for [employer] Team at [facility]  [facility] at [facility] ([address] [place], [place] [address]) Entrance C, located off of [address] Free 24/7 valet parking   CLASSES: Go to [email or web address] to register for classes (childbirth, breastfeeding, waterbirth, infant CPR, daddy bootcamp, etc.)  Call the office [phone]) or go to [facility] if: You begin to have strong, frequent contractions Your water breaks.  Sometimes it is a big gush of fluid, sometimes it is just a trickle that keeps getting your panties wet or running down your legs You have vaginal bleeding.  It is normal to have a small amount of spotting if your cervix was checked.  You don't feel your baby moving like normal.  If you don't, get you something to eat and drink and lay down and focus on feeling your baby move.   If your baby is still not moving like normal, you should call the office or go to [facility].  Call the office [phone]) or go to [facility] hospital for these signs of pre-eclampsia: Severe headache that does not go away with Tylenol  Visual changes- seeing spots, double, blurred vision Pain under your right breast or upper abdomen that does not go away with Tums or heartburn medicine Nausea and/or vomiting Severe swelling in your hands, feet, and face   Tdap Vaccine It is recommended that you get the Tdap vaccine during the third trimester of EACH pregnancy to help protect your baby from getting pertussis (whooping cough) 27-36 weeks is the BEST time to do  this so that you can pass the protection on to your baby. During pregnancy is better than after pregnancy, but if you are unable to get it during pregnancy it will be offered at the hospital.  You can get this vaccine with us , at the health department, your family doctor, or some local pharmacies Everyone who will be around your baby should also be up-to-date on their vaccines before the baby comes. Adults (who are not pregnant) only need 1 dose of Tdap during adulthood.   [facility] Pediatricians/Family Doctors [facility]): [address] Meg Spina, [phone]           [facility]: [address], [phone]                [facility] Medicine [facility]): [address], [phone] (call to ask if accepting patients) [facility] Department: [address], [place], [phone]    [facility] Pediatricians/Family Doctors [facility]): [phone] [address], [phone] [facility]: [address] [place], [phone] [facility]: [address], [phone]  [facility] Doctors  [facility]): [phone] [facility]: [address], [phone]   [facility] Doctors [facility]: [address] [phone]  [facility] Family Doctors  [facility]: [phone], [phone]  Home Blood Pressure Monitoring for Patients   Your provider has recommended that you check your  blood pressure (BP) at least once a week at home. If you do not have a blood pressure cuff at home, one will be provided for you. Contact your provider if you have not received your monitor within 1 week.   Helpful Tips for Accurate Home Blood Pressure Checks  Don't smoke, exercise, or drink caffeine 30 minutes before checking your BP Use the restroom before checking your BP (a full bladder can raise your  pressure) Relax in a comfortable upright chair Feet on the ground Left arm resting comfortably on a flat surface at the level of your heart Legs uncrossed Back supported Sit quietly and don't talk Place the cuff on your bare arm Adjust snuggly, so that only two fingertips can fit between your skin and the top of the cuff Check 2 readings separated by at least one minute Keep a log of your BP readings For a visual, please reference this diagram: http://ccnc.care/bpdiagram  Provider Name: [facility]     Phone: [phone]  Zone 1: ALL CLEAR  Continue to monitor your symptoms:  BP reading is less than 140 (top number) or less than 90 (bottom number)  No right upper stomach pain No headaches or seeing spots No feeling nauseated or throwing up No swelling in face and hands  Zone 2: CAUTION Call your doctor's office for any of the following:  BP reading is greater than 140 (top number) or greater than 90 (bottom number)  Stomach pain under your ribs in the middle or right side Headaches or seeing spots Feeling nauseated or throwing up Swelling in face and hands  Zone 3: EMERGENCY  Seek immediate medical care if you have any of the following:  BP reading is greater than160 (top number) or greater than 110 (bottom number) Severe headaches not improving with Tylenol  Serious difficulty catching your breath Any worsening symptoms from Zone 2  Preterm Labor and Birth Information  The normal length of a pregnancy is 39-41 weeks. Preterm labor is when labor starts before 37 completed weeks of pregnancy. What are the risk factors for preterm labor? Preterm labor is more likely to occur in women who: Have certain infections during pregnancy such as a bladder infection, sexually transmitted infection, or infection inside the uterus (chorioamnionitis). Have a shorter-than-normal cervix. Have gone into preterm labor before. Have had surgery on their cervix. Are younger than age 79  or older than age 52. Are African American. Are pregnant with twins or multiple babies (multiple gestation). Take street drugs or smoke while pregnant. Do not gain enough weight while pregnant. Became pregnant shortly after having been pregnant. What are the symptoms of preterm labor? Symptoms of preterm labor include: Cramps similar to those that can happen during a menstrual period. The cramps may happen with diarrhea. Pain in the abdomen or lower back. Regular uterine contractions that may feel like tightening of the abdomen. A feeling of increased pressure in the pelvis. Increased watery or bloody mucus discharge from the vagina. Water breaking (ruptured amniotic sac). Why is it important to recognize signs of preterm labor? It is important to recognize signs of preterm labor because babies who are born prematurely may not be fully developed. This can put them at an increased risk for: Long-term (chronic) heart and lung problems. Difficulty immediately after birth with regulating body systems, including blood sugar, body temperature, heart rate, and breathing rate. Bleeding in the brain. Cerebral palsy. Learning difficulties. Death. These risks are highest for babies who are born before 34 weeks  of pregnancy. How is preterm labor treated? Treatment depends on the length of your pregnancy, your condition, and the health of your baby. It may involve: Having a stitch (suture) placed in your cervix to prevent your cervix from opening too early (cerclage). Taking or being given medicines, such as: Hormone medicines. These may be given early in pregnancy to help support the pregnancy. Medicine to stop contractions. Medicines to help mature the baby's lungs. These may be prescribed if the risk of delivery is high. Medicines to prevent your baby from developing cerebral palsy. If the labor happens before 34 weeks of pregnancy, you may need to stay in the hospital. What should I do if I  think I am in preterm labor? If you think that you are going into preterm labor, call your health care provider right away. How can I prevent preterm labor in future pregnancies? To increase your chance of having a full-term pregnancy: Do not use any tobacco products, such as cigarettes, chewing tobacco, and e-cigarettes. If you need help quitting, ask your health care provider. Do not use street drugs or medicines that have not been prescribed to you during your pregnancy. Talk with your health care provider before taking any herbal supplements, even if you have been taking them regularly. Make sure you gain a healthy amount of weight during your pregnancy. Watch for infection. If you think that you might have an infection, get it checked right away. Make sure to tell your health care provider if you have gone into preterm labor before. This information is not intended to replace advice given to you by your health care provider. Make sure you discuss any questions you have with your health care provider. Document Revised: 03/12/2019 Document Reviewed: 04/10/2016 Elsevier Patient Education  2020 [employer].

## 2024-04-12 NOTE — Progress Notes (Signed)
 US  32+5 wks,cephalic,FHR 154 bpm,CX 4.3 cm,posterior placenta gr 3,AFI 14 cm,RI .53,.59,.56=25%,EFW 2040 g 41%,BPP 8/8

## 2024-04-15 ENCOUNTER — Other Ambulatory Visit

## 2024-04-16 ENCOUNTER — Other Ambulatory Visit: Payer: Self-pay | Admitting: Obstetrics & Gynecology

## 2024-04-16 DIAGNOSIS — O10919 Unspecified pre-existing hypertension complicating pregnancy, unspecified trimester: Secondary | ICD-10-CM

## 2024-04-19 ENCOUNTER — Ambulatory Visit

## 2024-04-19 ENCOUNTER — Ambulatory Visit: Admitting: Obstetrics & Gynecology

## 2024-04-19 ENCOUNTER — Other Ambulatory Visit: Payer: Medicaid Other

## 2024-04-19 ENCOUNTER — Encounter: Admitting: Obstetrics & Gynecology

## 2024-04-19 VITALS — BP 132/89 | HR 99 | Wt 237.0 lb

## 2024-04-19 DIAGNOSIS — O10913 Unspecified pre-existing hypertension complicating pregnancy, third trimester: Secondary | ICD-10-CM

## 2024-04-19 DIAGNOSIS — O0993 Supervision of high risk pregnancy, unspecified, third trimester: Secondary | ICD-10-CM

## 2024-04-19 DIAGNOSIS — Z3A33 33 weeks gestation of pregnancy: Secondary | ICD-10-CM

## 2024-04-19 DIAGNOSIS — O0990 Supervision of high risk pregnancy, unspecified, unspecified trimester: Secondary | ICD-10-CM

## 2024-04-19 DIAGNOSIS — O10919 Unspecified pre-existing hypertension complicating pregnancy, unspecified trimester: Secondary | ICD-10-CM

## 2024-04-19 NOTE — Progress Notes (Signed)
 US  33+5 wks,cephalic,FHR 157 bpm,BPP 8/8,posterior placenta gr 3,RI .67,.57,.54=47%,AFI 13 cm

## 2024-04-19 NOTE — Progress Notes (Signed)
 HIGH-RISK PREGNANCY VISIT Patient name: Lauren Ramirez MRN [id number]  Date of birth: 07-21-1986 Chief Complaint:   Routine Prenatal Visit  History of Present Illness:   Lauren Ramirez is a 37 y.o. [id number] female at [redacted]w[redacted]d with an Estimated Date of Delivery: 06/02/24 being seen today for ongoing management of a high-risk pregnancy complicated by cHTN no meds, borderline.    Today she reports no complaints. Contractions: Not present. Vag. Bleeding: None.  Movement: Present. denies leaking of fluid.      04/08/2024    9:43 AM 11/19/2023    3:29 PM 10/09/2023    2:11 PM 07/23/2018   11:56 AM 02/20/2017    9:38 AM  Depression screen PHQ 2/9  Decreased Interest 0 1 0 2 0  Down, Depressed, Hopeless 0 1 0 2 0  PHQ - 2 Score 0 2 0 4 0  Altered sleeping 0 0 0 0   Tired, decreased energy 1 1 1 3    Change in appetite 0 0 0 3   Feeling bad or failure about yourself  0 0 0 0   Trouble concentrating 0 1 0 0   Moving slowly or fidgety/restless 0 0 0 0   Suicidal thoughts 0 0 0 0   PHQ-9 Score 1 4 1 10          11/19/2023    3:29 PM 10/09/2023    2:11 PM  GAD 7 : Generalized Anxiety Score  Nervous, Anxious, on Edge 0 0  Control/stop worrying 0 0  Worry too much - different things 1 0  Trouble relaxing 0 0  Restless 0 0  Easily annoyed or irritable 1 1  Afraid - awful might happen 0 0  Total GAD 7 Score 2 1     Review of Systems:   Pertinent items are noted in HPI Denies abnormal vaginal discharge w/ itching/odor/irritation, headaches, visual changes, shortness of breath, chest pain, abdominal pain, severe nausea/vomiting, or problems with urination or bowel movements unless otherwise stated above. Pertinent History Reviewed:  Reviewed past medical,surgical, social, obstetrical and family history.  Reviewed problem list, medications and allergies. Physical Assessment:   Vitals:   04/19/24 1207  BP: 132/89  Pulse: 99  Weight: 237 lb (107.5 kg)  Body mass index is  38.25 kg/m.           Physical Examination:   General appearance: alert, well appearing, and in no distress  Mental status: alert, oriented to person, place, and time  Skin: warm & dry   Extremities:      Cardiovascular: normal heart rate noted  Respiratory: normal respiratory effort, no distress  Abdomen: gravid, soft, non-tender  Pelvic: Cervical exam deferred         Fetal Status:     Movement: Present    Fetal Surveillance Testing today: BPP 8/8 UAD normal   Chaperone: N/A    No results found for this or any previous visit (from the past 24 hours).  Assessment & Plan:  High-risk pregnancy: [id number] at [redacted]w[redacted]d with an Estimated Date of Delivery: 06/02/24      ICD-10-CM   1. Supervision of high risk pregnancy in third trimester  O09.93     2. [redacted] weeks gestation of pregnancy  Z3A.33     3. Chronic hypertension affecting pregnancy  O10.919         Meds: No orders of the defined types were placed in this encounter.   Orders: No orders of the defined types were  placed in this encounter.    Labs/procedures today: U/S  Treatment Plan:  keep scheduled    Follow-up: No follow-ups on file.   Future Appointments  Date Time Provider Department Center  04/19/2024  1:30 PM Wendelyn Halter, MD [facility]  04/22/2024 10:50 AM [facility] NURSE [facility]  04/27/2024 11:30 AM [facility] NURSE [facility]  04/30/2024  9:15 AM [facility] US  [facility] None  04/30/2024 10:10 AM Wendelyn Halter, MD [facility]  05/03/2024  9:15 AM [facility] 2 [facility] None  05/03/2024 10:10 AM Keene Pastures, DO [facility]  05/06/2024 10:50 AM [facility] NURSE [facility]  05/10/2024 10:45 AM [facility] 2 [facility] None  05/10/2024 11:30 AM Majel Scott, CNM [facility]  05/13/2024 10:50 AM [facility] NURSE [facility]  05/17/2024 10:00 AM [facility] 2 [facility] None  05/17/2024 10:50 AM Keene Pastures, DO [facility]  05/20/2024  9:10 AM [facility] NURSE  [facility]  05/24/2024  9:15 AM [facility] US  [facility] None  05/24/2024 10:10 AM Keene Pastures, DO [facility]  05/27/2024 10:50 AM [facility] NURSE [facility]  05/31/2024  9:15 AM [facility] US  [facility] None  05/31/2024 10:10 AM Wendelyn Halter, MD [facility]    No orders of the defined types were placed in this encounter.  Wendelyn Halter  Attending Physician for the Center for [facility] 04/19/2024 12:29 PM

## 2024-04-22 ENCOUNTER — Ambulatory Visit

## 2024-04-22 VITALS — BP 126/88 | Wt 236.2 lb

## 2024-04-22 DIAGNOSIS — O0993 Supervision of high risk pregnancy, unspecified, third trimester: Secondary | ICD-10-CM | POA: Diagnosis not present

## 2024-04-22 DIAGNOSIS — O10913 Unspecified pre-existing hypertension complicating pregnancy, third trimester: Secondary | ICD-10-CM

## 2024-04-22 DIAGNOSIS — Z3A34 34 weeks gestation of pregnancy: Secondary | ICD-10-CM

## 2024-04-22 DIAGNOSIS — O10919 Unspecified pre-existing hypertension complicating pregnancy, unspecified trimester: Secondary | ICD-10-CM

## 2024-04-22 NOTE — Progress Notes (Signed)
   NURSE VISIT- NST  SUBJECTIVE:  Lauren Ramirez is a 38 y.o. [phone] female at [redacted]w[redacted]d, here for a NST for pregnancy complicated by [facility].  She reports active fetal movement, contractions: none, vaginal bleeding: none, membranes: intact.   OBJECTIVE:  BP 126/88   LMP 08/27/2023   Appears well, no apparent distress  No results found for this or any previous visit (from the past 24 hours).  NST: FHR baseline 155 bpm, Variability: moderate, Accelerations:present, Decelerations:  Absent= Cat 1/reactive Toco: none   ASSESSMENT: [id number] at [redacted]w[redacted]d with CHTN NST reactive  PLAN: EFM strip reviewed by Dr. Randolm Butte   Recommendations: keep next appointment as scheduled    Alyssa Jumper  04/22/2024 11:39 AM

## 2024-04-27 ENCOUNTER — Ambulatory Visit

## 2024-04-27 VITALS — BP 123/88 | HR 103

## 2024-04-27 DIAGNOSIS — O10913 Unspecified pre-existing hypertension complicating pregnancy, third trimester: Secondary | ICD-10-CM

## 2024-04-27 DIAGNOSIS — O0993 Supervision of high risk pregnancy, unspecified, third trimester: Secondary | ICD-10-CM

## 2024-04-27 DIAGNOSIS — Z3A34 34 weeks gestation of pregnancy: Secondary | ICD-10-CM

## 2024-04-27 NOTE — Progress Notes (Signed)
   NURSE VISIT- NST  SUBJECTIVE:  Lauren Ramirez is a 38 y.o. [phone] female at [redacted]w[redacted]d, here for a NST for pregnancy complicated by [facility].  She reports active fetal movement, contractions: none, vaginal bleeding: none, membranes: intact.   OBJECTIVE:  BP 123/88   Pulse (!) 103   LMP 08/27/2023   Appears well, no apparent distress  No results found for this or any previous visit (from the past 24 hours).  NST: FHR baseline 150 bpm, Variability: moderate, Accelerations:present, Decelerations:  Absent= Cat 1/reactive Toco: x2 UC irregular  ASSESSMENT: [id number] at [redacted]w[redacted]d with CHTN NST reactive  PLAN: EFM strip reviewed by Dr. Randolm Butte   Recommendations: keep next appointment as scheduled    [name]  04/27/2024 11:52 AM

## 2024-04-28 ENCOUNTER — Other Ambulatory Visit: Payer: Self-pay | Admitting: Obstetrics & Gynecology

## 2024-04-28 DIAGNOSIS — O10919 Unspecified pre-existing hypertension complicating pregnancy, unspecified trimester: Secondary | ICD-10-CM

## 2024-04-28 DIAGNOSIS — Z8751 Personal history of pre-term labor: Secondary | ICD-10-CM

## 2024-04-28 DIAGNOSIS — O09523 Supervision of elderly multigravida, third trimester: Secondary | ICD-10-CM

## 2024-04-28 DIAGNOSIS — Z1379 Encounter for other screening for genetic and chromosomal anomalies: Secondary | ICD-10-CM

## 2024-04-28 DIAGNOSIS — Z98891 History of uterine scar from previous surgery: Secondary | ICD-10-CM

## 2024-04-28 DIAGNOSIS — O09299 Supervision of pregnancy with other poor reproductive or obstetric history, unspecified trimester: Secondary | ICD-10-CM

## 2024-04-28 DIAGNOSIS — O0993 Supervision of high risk pregnancy, unspecified, third trimester: Secondary | ICD-10-CM

## 2024-04-29 ENCOUNTER — Other Ambulatory Visit

## 2024-04-29 ENCOUNTER — Other Ambulatory Visit (HOSPITAL_COMMUNITY)
Admission: RE | Admit: 2024-04-29 | Discharge: 2024-04-29 | Disposition: A | Discharge status: Home / Self Care | Source: Ambulatory Visit | Attending: Women's Health | Admitting: Women's Health

## 2024-04-29 ENCOUNTER — Ambulatory Visit: Admitting: Women's Health

## 2024-04-29 ENCOUNTER — Other Ambulatory Visit: Payer: Self-pay | Admitting: Obstetrics & Gynecology

## 2024-04-29 ENCOUNTER — Encounter: Payer: Self-pay | Admitting: Women's Health

## 2024-04-29 VITALS — BP 122/89 | HR 108 | Wt 234.0 lb

## 2024-04-29 DIAGNOSIS — O10919 Unspecified pre-existing hypertension complicating pregnancy, unspecified trimester: Secondary | ICD-10-CM

## 2024-04-29 DIAGNOSIS — O0993 Supervision of high risk pregnancy, unspecified, third trimester: Secondary | ICD-10-CM | POA: Insufficient documentation

## 2024-04-29 DIAGNOSIS — O10913 Unspecified pre-existing hypertension complicating pregnancy, third trimester: Secondary | ICD-10-CM

## 2024-04-29 DIAGNOSIS — O09523 Supervision of elderly multigravida, third trimester: Secondary | ICD-10-CM

## 2024-04-29 DIAGNOSIS — O26893 Other specified pregnancy related conditions, third trimester: Secondary | ICD-10-CM | POA: Insufficient documentation

## 2024-04-29 DIAGNOSIS — Z98891 History of uterine scar from previous surgery: Secondary | ICD-10-CM

## 2024-04-29 DIAGNOSIS — N898 Other specified noninflammatory disorders of vagina: Secondary | ICD-10-CM | POA: Diagnosis not present

## 2024-04-29 DIAGNOSIS — Z8751 Personal history of pre-term labor: Secondary | ICD-10-CM

## 2024-04-29 DIAGNOSIS — O09299 Supervision of pregnancy with other poor reproductive or obstetric history, unspecified trimester: Secondary | ICD-10-CM

## 2024-04-29 DIAGNOSIS — Z3A35 35 weeks gestation of pregnancy: Secondary | ICD-10-CM | POA: Diagnosis not present

## 2024-04-29 LAB — POCT URINALYSIS DIPSTICK OB
Blood, UA: NEGATIVE
Glucose, UA: NEGATIVE
Ketones, UA: NEGATIVE
Leukocytes, UA: NEGATIVE
Nitrite, UA: NEGATIVE
POC,PROTEIN,UA: NEGATIVE

## 2024-04-29 NOTE — Patient Instructions (Signed)
 Lauren Ramirez, thank you for choosing our office today! We appreciate the opportunity to meet your healthcare needs. You may receive a short survey by mail, e-mail, or through [employer]. If you are happy with your care we would appreciate if you could take just a few minutes to complete the survey questions. We read all of your comments and take your feedback very seriously. Thank you again for choosing our office.  Center for [employer] Team at [facility]  [facility] at [facility] ([address] [place], [place] [address]) Entrance C, located off of [address] Free 24/7 valet parking   CLASSES: Go to [email or web address] to register for classes (childbirth, breastfeeding, waterbirth, infant CPR, daddy bootcamp, etc.)  Call the office [phone]) or go to [facility] if: You begin to have strong, frequent contractions Your water breaks.  Sometimes it is a big gush of fluid, sometimes it is just a trickle that keeps getting your panties wet or running down your legs You have vaginal bleeding.  It is normal to have a small amount of spotting if your cervix was checked.  You don't feel your baby moving like normal.  If you don't, get you something to eat and drink and lay down and focus on feeling your baby move.   If your baby is still not moving like normal, you should call the office or go to [facility].  Call the office [phone]) or go to [facility] hospital for these signs of pre-eclampsia: Severe headache that does not go away with Tylenol  Visual changes- seeing spots, double, blurred vision Pain under your right breast or upper abdomen that does not go away with Tums or heartburn medicine Nausea and/or vomiting Severe swelling in your hands, feet, and face   Tdap Vaccine It is recommended that you get the Tdap vaccine during the third trimester of EACH pregnancy to help protect your baby from getting pertussis (whooping cough) 27-36 weeks is the BEST time to do  this so that you can pass the protection on to your baby. During pregnancy is better than after pregnancy, but if you are unable to get it during pregnancy it will be offered at the hospital.  You can get this vaccine with us , at the health department, your family doctor, or some local pharmacies Everyone who will be around your baby should also be up-to-date on their vaccines before the baby comes. Adults (who are not pregnant) only need 1 dose of Tdap during adulthood.   [facility] Pediatricians/Family Doctors [facility]): [address] Meg Spina, [phone]           [facility]: [address], [phone]                [facility] Medicine [facility]): [address], [phone] (call to ask if accepting patients) [facility] Department: [address], [place], [phone]    [facility] Pediatricians/Family Doctors [facility]): [phone] [address], [phone] [facility]: [address] [place], [phone] [facility]: [address], [phone]  [facility] Doctors  [facility]): [phone] [facility]: [address], [phone]   [facility] Doctors [facility]: [address] [phone]  [facility] Family Doctors  [facility]: [phone], [phone]  Home Blood Pressure Monitoring for Patients   Your provider has recommended that you check your  blood pressure (BP) at least once a week at home. If you do not have a blood pressure cuff at home, one will be provided for you. Contact your provider if you have not received your monitor within 1 week.   Helpful Tips for Accurate Home Blood Pressure Checks  Don't smoke, exercise, or drink caffeine 30 minutes before checking your BP Use the restroom before checking your BP (a full bladder can raise your  pressure) Relax in a comfortable upright chair Feet on the ground Left arm resting comfortably on a flat surface at the level of your heart Legs uncrossed Back supported Sit quietly and don't talk Place the cuff on your bare arm Adjust snuggly, so that only two fingertips can fit between your skin and the top of the cuff Check 2 readings separated by at least one minute Keep a log of your BP readings For a visual, please reference this diagram: http://ccnc.care/bpdiagram  Provider Name: [facility]     Phone: [phone]  Zone 1: ALL CLEAR  Continue to monitor your symptoms:  BP reading is less than 140 (top number) or less than 90 (bottom number)  No right upper stomach pain No headaches or seeing spots No feeling nauseated or throwing up No swelling in face and hands  Zone 2: CAUTION Call your doctor's office for any of the following:  BP reading is greater than 140 (top number) or greater than 90 (bottom number)  Stomach pain under your ribs in the middle or right side Headaches or seeing spots Feeling nauseated or throwing up Swelling in face and hands  Zone 3: EMERGENCY  Seek immediate medical care if you have any of the following:  BP reading is greater than160 (top number) or greater than 110 (bottom number) Severe headaches not improving with Tylenol  Serious difficulty catching your breath Any worsening symptoms from Zone 2  Preterm Labor and Birth Information  The normal length of a pregnancy is 39-41 weeks. Preterm labor is when labor starts before 37 completed weeks of pregnancy. What are the risk factors for preterm labor? Preterm labor is more likely to occur in women who: Have certain infections during pregnancy such as a bladder infection, sexually transmitted infection, or infection inside the uterus (chorioamnionitis). Have a shorter-than-normal cervix. Have gone into preterm labor before. Have had surgery on their cervix. Are younger than age 79  or older than age 52. Are African American. Are pregnant with twins or multiple babies (multiple gestation). Take street drugs or smoke while pregnant. Do not gain enough weight while pregnant. Became pregnant shortly after having been pregnant. What are the symptoms of preterm labor? Symptoms of preterm labor include: Cramps similar to those that can happen during a menstrual period. The cramps may happen with diarrhea. Pain in the abdomen or lower back. Regular uterine contractions that may feel like tightening of the abdomen. A feeling of increased pressure in the pelvis. Increased watery or bloody mucus discharge from the vagina. Water breaking (ruptured amniotic sac). Why is it important to recognize signs of preterm labor? It is important to recognize signs of preterm labor because babies who are born prematurely may not be fully developed. This can put them at an increased risk for: Long-term (chronic) heart and lung problems. Difficulty immediately after birth with regulating body systems, including blood sugar, body temperature, heart rate, and breathing rate. Bleeding in the brain. Cerebral palsy. Learning difficulties. Death. These risks are highest for babies who are born before 34 weeks  of pregnancy. How is preterm labor treated? Treatment depends on the length of your pregnancy, your condition, and the health of your baby. It may involve: Having a stitch (suture) placed in your cervix to prevent your cervix from opening too early (cerclage). Taking or being given medicines, such as: Hormone medicines. These may be given early in pregnancy to help support the pregnancy. Medicine to stop contractions. Medicines to help mature the baby's lungs. These may be prescribed if the risk of delivery is high. Medicines to prevent your baby from developing cerebral palsy. If the labor happens before 34 weeks of pregnancy, you may need to stay in the hospital. What should I do if I  think I am in preterm labor? If you think that you are going into preterm labor, call your health care provider right away. How can I prevent preterm labor in future pregnancies? To increase your chance of having a full-term pregnancy: Do not use any tobacco products, such as cigarettes, chewing tobacco, and e-cigarettes. If you need help quitting, ask your health care provider. Do not use street drugs or medicines that have not been prescribed to you during your pregnancy. Talk with your health care provider before taking any herbal supplements, even if you have been taking them regularly. Make sure you gain a healthy amount of weight during your pregnancy. Watch for infection. If you think that you might have an infection, get it checked right away. Make sure to tell your health care provider if you have gone into preterm labor before. This information is not intended to replace advice given to you by your health care provider. Make sure you discuss any questions you have with your health care provider. Document Revised: 03/12/2019 Document Reviewed: 04/10/2016 Elsevier Patient Education  2020 [employer].

## 2024-04-29 NOTE — Progress Notes (Signed)
 Work-in HIGH-RISK PREGNANCY VISIT Patient name: Lauren Ramirez MRN [id number]  Date of birth: 10/21/1986 Chief Complaint:   Rupture of Membranes  History of Present Illness:   Lauren Ramirez is a 38 y.o. [id number] female at [redacted]w[redacted]d with an Estimated Date of Delivery: 06/02/24 being seen today for ongoing management of a high-risk pregnancy complicated by chronic hypertension currently on no meds.    Today she reports lower back pain and some irregular contractions around 0100, went to br around 0300-0400, felt small gush of fluid in toilet, none since. . Denies ha, visual changes, ruq/epigastric pain, n/v.   Contractions: Not present. Vag. Bleeding: None.  Movement: Present.      04/08/2024    9:43 AM 11/19/2023    3:29 PM 10/09/2023    2:11 PM 07/23/2018   11:56 AM 02/20/2017    9:38 AM  Depression screen PHQ 2/9  Decreased Interest 0 1 0 2 0  Down, Depressed, Hopeless 0 1 0 2 0  PHQ - 2 Score 0 2 0 4 0  Altered sleeping 0 0 0 0   Tired, decreased energy 1 1 1 3    Change in appetite 0 0 0 3   Feeling bad or failure about yourself  0 0 0 0   Trouble concentrating 0 1 0 0   Moving slowly or fidgety/restless 0 0 0 0   Suicidal thoughts 0 0 0 0   PHQ-9 Score 1 4 1 10          11/19/2023    3:29 PM 10/09/2023    2:11 PM  GAD 7 : Generalized Anxiety Score  Nervous, Anxious, on Edge 0 0  Control/stop worrying 0 0  Worry too much - different things 1 0  Trouble relaxing 0 0  Restless 0 0  Easily annoyed or irritable 1 1  Afraid - awful might happen 0 0  Total GAD 7 Score 2 1     Review of Systems:   Pertinent items are noted in HPI Denies abnormal vaginal discharge w/ itching/odor/irritation, headaches, visual changes, shortness of breath, chest pain, abdominal pain, severe nausea/vomiting, or problems with urination or bowel movements unless otherwise stated above. Pertinent History Reviewed:  Reviewed past medical,surgical, social, obstetrical and family history.   Reviewed problem list, medications and allergies. Physical Assessment:   Vitals:   04/29/24 1155 04/29/24 1157  BP: (!) 134/94 122/89  Pulse: (!) 110 (!) 108  Weight: 234 lb (106.1 kg)   Body mass index is 37.77 kg/m.           Physical Examination:   General appearance: alert, well appearing, and in no distress  Mental status: alert, oriented to person, place, and time  Skin: warm & dry   Extremities:      Cardiovascular: normal heart rate noted  Respiratory: normal respiratory effort, no distress  Abdomen: gravid, soft, non-tender  Pelvic: SSE: cx visually closed/long, no pooling, no change w/ valsalva. Fern & nitrazine neg, SVE outer os 1, inner os closed/thick/ballotable  Dilation: Closed Effacement (%): Thick Station: Ballotable  Fetal Status: Fetal Heart Rate (bpm): 150   Movement: Present    Fetal Surveillance Testing today: doppler   Chaperone: Lorean Rodes  Results for orders placed or performed in visit on 04/29/24 (from the past 24 hours)  POC Urinalysis Dipstick OB   Collection Time: 04/29/24 12:00 PM  Result Value Ref Range   Color, UA     Clarity, UA     Glucose, UA  Negative Negative   Bilirubin, UA     Ketones, UA neg    Spec Grav, UA     Blood, UA neg    pH, UA     POC,PROTEIN,UA Negative Negative, Trace, Small (1+), Moderate (2+), Large (3+), 4+   Urobilinogen, UA     Nitrite, UA neg    Leukocytes, UA Negative Negative   Appearance     Odor      Assessment & Plan:  High-risk pregnancy: [id number] at [redacted]w[redacted]d with an Estimated Date of Delivery: 06/02/24   1) CHTN, no meds, bp stable  2) R/O ROM, no evidence of ROM, CV swab sent, reviewed ptl s/s, reasons to seek care  3) H/O PTB x 3  4) Prev c/s x 1> wants TOLAC  Meds: No orders of the defined types were placed in this encounter.   Labs/procedures today: spec exam and SVE, fern  Treatment Plan:  as scheduled  Reviewed: Preterm labor symptoms and general obstetric precautions including but  not limited to vaginal bleeding, contractions, leaking of fluid and fetal movement were reviewed in detail with the patient.  All questions were answered. Does have home bp cuff. Office bp cuff given: not applicable. Check bp daily, let us  know if consistently >140 and/or >90.  Follow-up: Return for As scheduled.   Future Appointments  Date Time Provider Department Center  04/30/2024  9:15 AM [facility] - [facility] US  [facility] None  04/30/2024 10:10 AM Wendelyn Halter, MD [facility]  05/03/2024  9:15 AM [facility] IM[facility] 2 [facility] None  05/03/2024 10:10 AM Keene Pastures, DO [facility]  05/06/2024 10:50 AM [facility] NURSE [facility]  05/10/2024 10:45 AM [facility] 2 [facility] None  05/10/2024 11:30 AM Majel Scott, CNM [facility]  05/13/2024 10:50 AM [facility] NURSE [facility]  05/17/2024 10:00 AM [facility] 2 [facility] None  05/17/2024 10:50 AM Keene Pastures, DO [facility]  05/20/2024  9:10 AM [facility] NURSE [facility]  05/24/2024  9:15 AM [facility] US  [facility] None  05/24/2024 10:10 AM Keene Pastures, DO [facility]  05/27/2024 10:50 AM [facility] NURSE [facility]  05/31/2024  9:15 AM [facility] US  [facility] None  05/31/2024 10:10 AM Wendelyn Halter, MD [facility]    Orders Placed This Encounter  Procedures   POC Urinalysis Dipstick OB   Ferd Householder CNM, [facility] 04/29/2024 12:17 PM

## 2024-04-30 ENCOUNTER — Encounter: Admitting: Obstetrics & Gynecology

## 2024-04-30 ENCOUNTER — Other Ambulatory Visit

## 2024-04-30 LAB — CERVICOVAGINAL ANCILLARY ONLY
Bacterial Vaginitis (gardnerella): NEGATIVE
Candida Glabrata: NEGATIVE
Candida Vaginitis: POSITIVE — AB
Chlamydia: NEGATIVE
Comment: NEGATIVE
Comment: NEGATIVE
Comment: NEGATIVE
Comment: NEGATIVE
Comment: NEGATIVE
Comment: NORMAL
Neisseria Gonorrhea: NEGATIVE
Trichomonas: NEGATIVE

## 2024-05-03 ENCOUNTER — Ambulatory Visit: Admitting: Radiology

## 2024-05-03 ENCOUNTER — Ambulatory Visit: Payer: Self-pay | Admitting: Women's Health

## 2024-05-03 ENCOUNTER — Ambulatory Visit: Admitting: Obstetrics & Gynecology

## 2024-05-03 VITALS — BP 129/87 | HR 93 | Wt 238.2 lb

## 2024-05-03 DIAGNOSIS — O09523 Supervision of elderly multigravida, third trimester: Secondary | ICD-10-CM | POA: Diagnosis not present

## 2024-05-03 DIAGNOSIS — Z98891 History of uterine scar from previous surgery: Secondary | ICD-10-CM | POA: Diagnosis not present

## 2024-05-03 DIAGNOSIS — O10913 Unspecified pre-existing hypertension complicating pregnancy, third trimester: Secondary | ICD-10-CM

## 2024-05-03 DIAGNOSIS — O0993 Supervision of high risk pregnancy, unspecified, third trimester: Secondary | ICD-10-CM

## 2024-05-03 DIAGNOSIS — Z3A35 35 weeks gestation of pregnancy: Secondary | ICD-10-CM | POA: Diagnosis not present

## 2024-05-03 DIAGNOSIS — O10919 Unspecified pre-existing hypertension complicating pregnancy, unspecified trimester: Secondary | ICD-10-CM

## 2024-05-03 DIAGNOSIS — Z8751 Personal history of pre-term labor: Secondary | ICD-10-CM

## 2024-05-03 DIAGNOSIS — O09293 Supervision of pregnancy with other poor reproductive or obstetric history, third trimester: Secondary | ICD-10-CM

## 2024-05-03 DIAGNOSIS — O09299 Supervision of pregnancy with other poor reproductive or obstetric history, unspecified trimester: Secondary | ICD-10-CM

## 2024-05-03 MED ORDER — TERCONAZOLE 0.4 % VA CREA: 1.0000 | TOPICAL_CREAM | Freq: Every day | VAGINAL | 0 refills | Status: DC

## 2024-05-03 MED ORDER — FLUCONAZOLE 150 MG PO TABS: 150.0000 mg | ORAL_TABLET | Freq: Once | ORAL | 0 refills | Status: AC

## 2024-05-03 NOTE — Progress Notes (Signed)
 US : GA = 35+5 weeks Single active female fetus, cephalic, FHR = 149 bpm, posterior pl, gr3, AFI = 11.1 cm, 23%, MVP = 3.6 cm, BPP = 8/8, RI: 0.56  36%,  good EDF

## 2024-05-03 NOTE — Progress Notes (Signed)
 HIGH-RISK PREGNANCY VISIT Patient name: Lauren Ramirez MRN [id number]  Date of birth: 01/06/1986 Chief Complaint:   Routine Prenatal Visit  History of Present Illness:   Lauren Ramirez is a 38 y.o. [id number] female at [redacted]w[redacted]d with an Estimated Date of Delivery: 06/02/24 being seen today for ongoing management of a high-risk pregnancy complicated by:  -Chronic HTN no meds asymptomatic -Prior C-section, desires VBAC  Today she reports no complaints.   Contractions: Not present. Vag. Bleeding: None.  Movement: Present. denies leaking of fluid.      04/08/2024    9:43 AM 11/19/2023    3:29 PM 10/09/2023    2:11 PM 07/23/2018   11:56 AM 02/20/2017    9:38 AM  Depression screen PHQ 2/9  Decreased Interest 0 1 0 2 0  Down, Depressed, Hopeless 0 1 0 2 0  PHQ - 2 Score 0 2 0 4 0  Altered sleeping 0 0 0 0   Tired, decreased energy 1 1 1 3    Change in appetite 0 0 0 3   Feeling bad or failure about yourself  0 0 0 0   Trouble concentrating 0 1 0 0   Moving slowly or fidgety/restless 0 0 0 0   Suicidal thoughts 0 0 0 0   PHQ-9 Score 1 4 1 10       Current Outpatient Medications  Medication Instructions   acetaminophen  (TYLENOL ) 500 mg, Every 6 hours PRN   aspirin  EC 81 mg, Oral, Daily, Swallow whole.   cholecalciferol (VITAMIN D3) 1,000 Units, Daily   cyanocobalamin (VITAMIN B12) 1000 MCG/ML injection Inject into the muscle.   fluticasone  (FLONASE ) 50 MCG/ACT nasal spray 1 spray, Each Nare, Daily   Multiple Vitamin (MULTIVITAMIN) tablet 1 tablet, Daily   NIFEdipine  (PROCARDIA  XL) 30 mg, Oral, Daily   ondansetron  (ZOFRAN -ODT) 4 MG disintegrating tablet TAKE 1 TABLET BY MOUTH EVERY 8 HOURS AS NEEDED FOR NAUSEA AND VOMITING   progesterone  (PROMETRIUM ) 200 mg, Vaginal, Daily at bedtime   terconazole (TERAZOL 7) 0.4 % vaginal cream 1 applicator, Vaginal, Daily at bedtime     Review of Systems:   Pertinent items are noted in HPI Denies abnormal vaginal discharge w/  itching/odor/irritation, headaches, visual changes, shortness of breath, chest pain, abdominal pain, severe nausea/vomiting, or problems with urination or bowel movements unless otherwise stated above. Pertinent History Reviewed:  Reviewed past medical,surgical, social, obstetrical and family history.  Reviewed problem list, medications and allergies. Physical Assessment:   Vitals:   05/03/24 1004  BP: 129/87  Pulse: 93  Weight: 238 lb 3.2 oz (108 kg)  Body mass index is 38.45 kg/m.           Physical Examination:   General appearance: alert, well appearing, and in no distress  Mental status: normal mood, behavior, speech, dress, motor activity, and thought processes  Skin: warm & dry   Extremities:      Cardiovascular: normal heart rate noted  Respiratory: normal respiratory effort, no distress  Abdomen: gravid, soft, non-tender  Pelvic: Cervical exam deferred         Fetal Status:     Movement: Present    Fetal Surveillance Testing today: Single active female fetus, cephalic, FHR = 149 bpm, posterior pl, gr3, AFI = 11.1 cm, 23%, MVP = 3.6 cm, BPP = 8/8, RI: 0.56 36%, good EDF    Chaperone: N/A    No results found for this or any previous visit (from the past 24 hours).   Assessment &  Plan:  High-risk pregnancy: [id number] at 100w5d with an Estimated Date of Delivery: 06/02/24   1) Chronic HTN -no meds, stable -BPP 8/8, continue antepartum testing -reviewed IOL- plan for 38wks- scheduled for 6/18 daytime  []  declined GBS today, plan to complete next visit  2) Prior C-section Desires TOLAC  Routine OB care  Meds: No orders of the defined types were placed in this encounter.   Labs/procedures today: BPP  Treatment Plan:  routine OB care and as outlined above, IOL June 18  Reviewed: Preterm labor symptoms and general obstetric precautions including but not limited to vaginal bleeding, contractions, leaking of fluid and fetal movement were reviewed in detail with the  patient.  All questions were answered. Pt has home bp cuff. Check bp weekly, let us  know if >140/90.   Follow-up: Return for as scheduled twice weekly.   Future Appointments  Date Time Provider Department Center  05/06/2024 10:50 AM [facility] NURSE [facility]  05/10/2024 10:45 AM [facility] 2 [facility] None  05/10/2024 11:30 AM Majel Scott, CNM [facility]  05/13/2024 10:50 AM [facility] NURSE [facility]  05/17/2024 10:00 AM [facility] 2 [facility] None  05/17/2024 10:50 AM Keene Pastures, DO [facility]  05/20/2024  9:10 AM [facility] NURSE [facility]  05/24/2024  9:15 AM [facility] US  [facility] None  05/24/2024 10:10 AM Keene Pastures, DO [facility]  05/27/2024 10:50 AM [facility] NURSE [facility]  05/31/2024  9:15 AM [facility] US  [facility] None  05/31/2024 10:10 AM Wendelyn Halter, MD [facility]    No orders of the defined types were placed in this encounter.   Lilliauna Van, DO Attending Obstetrician & Gynecologist, [facility] for [employer], [facility]

## 2024-05-04 ENCOUNTER — Other Ambulatory Visit: Payer: Self-pay | Admitting: Obstetrics & Gynecology

## 2024-05-04 DIAGNOSIS — O10919 Unspecified pre-existing hypertension complicating pregnancy, unspecified trimester: Secondary | ICD-10-CM

## 2024-05-04 DIAGNOSIS — O0993 Supervision of high risk pregnancy, unspecified, third trimester: Secondary | ICD-10-CM

## 2024-05-04 DIAGNOSIS — Z3A16 16 weeks gestation of pregnancy: Secondary | ICD-10-CM

## 2024-05-04 DIAGNOSIS — Z98891 History of uterine scar from previous surgery: Secondary | ICD-10-CM

## 2024-05-04 DIAGNOSIS — Z8751 Personal history of pre-term labor: Secondary | ICD-10-CM

## 2024-05-04 DIAGNOSIS — Z1379 Encounter for other screening for genetic and chromosomal anomalies: Secondary | ICD-10-CM

## 2024-05-04 DIAGNOSIS — Z6838 Body mass index (BMI) 38.0-38.9, adult: Secondary | ICD-10-CM

## 2024-05-04 DIAGNOSIS — O09529 Supervision of elderly multigravida, unspecified trimester: Secondary | ICD-10-CM

## 2024-05-04 DIAGNOSIS — O09523 Supervision of elderly multigravida, third trimester: Secondary | ICD-10-CM

## 2024-05-04 DIAGNOSIS — Z3480 Encounter for supervision of other normal pregnancy, unspecified trimester: Secondary | ICD-10-CM

## 2024-05-04 DIAGNOSIS — Z124 Encounter for screening for malignant neoplasm of cervix: Secondary | ICD-10-CM

## 2024-05-04 DIAGNOSIS — O09299 Supervision of pregnancy with other poor reproductive or obstetric history, unspecified trimester: Secondary | ICD-10-CM

## 2024-05-04 DIAGNOSIS — Z8759 Personal history of other complications of pregnancy, childbirth and the puerperium: Secondary | ICD-10-CM

## 2024-05-06 ENCOUNTER — Ambulatory Visit

## 2024-05-06 VITALS — BP 122/87 | Wt 240.0 lb

## 2024-05-06 DIAGNOSIS — Z3A36 36 weeks gestation of pregnancy: Secondary | ICD-10-CM

## 2024-05-06 DIAGNOSIS — O0993 Supervision of high risk pregnancy, unspecified, third trimester: Secondary | ICD-10-CM | POA: Diagnosis not present

## 2024-05-06 DIAGNOSIS — O34211 Maternal care for low transverse scar from previous cesarean delivery: Secondary | ICD-10-CM

## 2024-05-06 DIAGNOSIS — O10013 Pre-existing essential hypertension complicating pregnancy, third trimester: Secondary | ICD-10-CM

## 2024-05-06 NOTE — Progress Notes (Signed)
   NURSE VISIT- NST  SUBJECTIVE:  Lauren Ramirez is a 38 y.o. [phone] female at [redacted]w[redacted]d, here for a NST for pregnancy complicated by [facility].  She reports active fetal movement, contractions: none, vaginal bleeding: none, membranes: intact.   OBJECTIVE:  BP 122/87   Wt 240 lb (108.9 kg)   LMP 08/27/2023   BMI 38.74 kg/m   Appears well, no apparent distress  No results found for this or any previous visit (from the past 24 hours).  NST: FHR baseline 140 bpm, Variability: moderate, Accelerations:present, Decelerations:  Absent= Cat 1/reactive Toco: none   ASSESSMENT: [id number] at [redacted]w[redacted]d with CHTN NST reactive  PLAN: EFM strip reviewed by Lauren Eric FNP   Recommendations: keep next appointment as scheduled    Greer Wainright E Severina Sykora  05/06/2024 11:45 AM

## 2024-05-10 ENCOUNTER — Ambulatory Visit (INDEPENDENT_AMBULATORY_CARE_PROVIDER_SITE_OTHER): Admitting: Advanced Practice Midwife

## 2024-05-10 ENCOUNTER — Encounter: Payer: Self-pay | Admitting: Advanced Practice Midwife

## 2024-05-10 ENCOUNTER — Ambulatory Visit: Admitting: Radiology

## 2024-05-10 VITALS — BP 130/94 | HR 103 | Wt 242.0 lb

## 2024-05-10 DIAGNOSIS — O10919 Unspecified pre-existing hypertension complicating pregnancy, unspecified trimester: Secondary | ICD-10-CM | POA: Diagnosis not present

## 2024-05-10 DIAGNOSIS — O0993 Supervision of high risk pregnancy, unspecified, third trimester: Secondary | ICD-10-CM

## 2024-05-10 DIAGNOSIS — O10913 Unspecified pre-existing hypertension complicating pregnancy, third trimester: Secondary | ICD-10-CM

## 2024-05-10 DIAGNOSIS — Z98891 History of uterine scar from previous surgery: Secondary | ICD-10-CM

## 2024-05-10 DIAGNOSIS — O09523 Supervision of elderly multigravida, third trimester: Secondary | ICD-10-CM | POA: Diagnosis not present

## 2024-05-10 DIAGNOSIS — Z6838 Body mass index (BMI) 38.0-38.9, adult: Secondary | ICD-10-CM

## 2024-05-10 DIAGNOSIS — Z3A36 36 weeks gestation of pregnancy: Secondary | ICD-10-CM

## 2024-05-10 DIAGNOSIS — O09293 Supervision of pregnancy with other poor reproductive or obstetric history, third trimester: Secondary | ICD-10-CM

## 2024-05-10 DIAGNOSIS — Z8751 Personal history of pre-term labor: Secondary | ICD-10-CM | POA: Diagnosis not present

## 2024-05-10 DIAGNOSIS — O09529 Supervision of elderly multigravida, unspecified trimester: Secondary | ICD-10-CM

## 2024-05-10 DIAGNOSIS — O09299 Supervision of pregnancy with other poor reproductive or obstetric history, unspecified trimester: Secondary | ICD-10-CM

## 2024-05-10 NOTE — Progress Notes (Signed)
 US : GA = 36+1 weeks Single active fetus, cephalic, FHR = 134 bpm, AFI = 9.6 cm, 14%, MVP = 3.5 cm,  EFW 40%, 2874g, BPP = 6/8, UAD RI: 0.52, 0.54  32.6%

## 2024-05-10 NOTE — Patient Instructions (Signed)
 Glennda Langton, I greatly value your feedback.  If you receive a survey following your visit with us  today, we appreciate you taking the time to fill it out.  Thanks, Monda Angry, DNP, CNM  [facility] HAS MOVED!!! It is now [facility] at [employer] ([address] [place], [place] [address]) Entrance located off of [address] Free 24/7 valet parking   Go to [email or web address] to register for FREE online childbirth classes    Call the office [phone]) or go to [facility] if: You begin to have strong, frequent contractions Your water breaks.  Sometimes it is a big gush of fluid, sometimes it is just a trickle that keeps getting your panties wet or running down your legs You have vaginal bleeding.  It is normal to have a small amount of spotting if your cervix was checked.  You don't feel your baby moving like normal.  If you don't, get you something to eat and drink and lay down and focus on feeling your baby move.  You should feel at least 10 movements in 2 hours.  If you don't, you should call the office or go to [facility].   Home Blood Pressure Monitoring for Patients   Your provider has recommended that you check your blood pressure (BP) at least once a week at home. If you do not have a blood pressure cuff at home, one will be provided for you. Contact your provider if you have not received your monitor within 1 week.   Helpful Tips for Accurate Home Blood Pressure Checks  Don't smoke, exercise, or drink caffeine 30 minutes before checking your BP Use the restroom before checking your BP (a full bladder can raise your pressure) Relax in a comfortable upright chair Feet on the ground Left arm resting comfortably on a flat surface at the level of your heart Legs uncrossed Back supported Sit quietly and don't talk Place the cuff on your bare arm Adjust snuggly, so that only two fingertips can fit between your skin and  the top of the cuff Check 2 readings separated by at least one minute Keep a log of your BP readings For a visual, please reference this diagram: http://ccnc.care/bpdiagram  Provider Name: [facility]     Phone: [phone]  Zone 1: ALL CLEAR  Continue to monitor your symptoms:  BP reading is less than 140 (top number) or less than 90 (bottom number)  No right upper stomach pain No headaches or seeing spots No feeling nauseated or throwing up No swelling in face and hands  Zone 2: CAUTION Call your doctor's office for any of the following:  BP reading is greater than 140 (top number) or greater than 90 (bottom number)  Stomach pain under your ribs in the middle or right side Headaches or seeing spots Feeling nauseated or throwing up Swelling in face and hands  Zone 3: EMERGENCY  Seek immediate medical care if you have any of the following:  BP reading is greater than160 (top number) or greater than 110 (bottom number) Severe headaches not improving with Tylenol  Serious difficulty catching your breath Any worsening symptoms from Zone 2

## 2024-05-10 NOTE — Progress Notes (Signed)
 HIGH-RISK PREGNANCY VISIT Patient name: Lauren Ramirez MRN [id number]  Date of birth: 1986-10-26 Chief Complaint:   Routine Prenatal Visit  History of Present Illness:   Lauren Ramirez is a 38 y.o. [id number] female at [redacted]w[redacted]d with an Estimated Date of Delivery: 06/02/24 being seen today for ongoing management of a high-risk pregnancy complicated by chronic hypertension currently on no meds after stopping procardia , BP stable at home.  Hx PTD X3, this is the farthest she has gone. .    Today she reports baby moving a lot, no C/O. Contractions: Not present. Vag. Bleeding: None.  Movement: Present. denies leaking of fluid.      04/08/2024    9:43 AM 11/19/2023    3:29 PM 10/09/2023    2:11 PM 07/23/2018   11:56 AM 02/20/2017    9:38 AM  Depression screen PHQ 2/9  Decreased Interest 0 1 0 2 0  Down, Depressed, Hopeless 0 1 0 2 0  PHQ - 2 Score 0 2 0 4 0  Altered sleeping 0 0 0 0   Tired, decreased energy 1 1 1 3    Change in appetite 0 0 0 3   Feeling bad or failure about yourself  0 0 0 0   Trouble concentrating 0 1 0 0   Moving slowly or fidgety/restless 0 0 0 0   Suicidal thoughts 0 0 0 0   PHQ-9 Score 1 4 1 10          11/19/2023    3:29 PM 10/09/2023    2:11 PM  GAD 7 : Generalized Anxiety Score  Nervous, Anxious, on Edge 0 0  Control/stop worrying 0 0  Worry too much - different things 1 0  Trouble relaxing 0 0  Restless 0 0  Easily annoyed or irritable 1 1  Afraid - awful might happen 0 0  Total GAD 7 Score 2 1     Review of Systems:   Pertinent items are noted in HPI Denies abnormal vaginal discharge w/ itching/odor/irritation, headaches, visual changes, shortness of breath, chest pain, abdominal pain, severe nausea/vomiting, or problems with urination or bowel movements unless otherwise stated above. Pertinent History Reviewed:  Reviewed past medical,surgical, social, obstetrical and family history.  Reviewed problem list, medications and  allergies. Physical Assessment:   Vitals:   05/10/24 1151  BP: (!) 130/94  Pulse: (!) 103  Weight: 242 lb (109.8 kg)  Body mass index is 39.06 kg/m.           Physical Examination:   General appearance: alert, well appearing, and in no distress  Mental status: alert, oriented to person, place, and time  Skin: warm & dry   Extremities: Edema: None    Cardiovascular: normal heart rate noted  Respiratory: normal respiratory effort, no distress  Abdomen: gravid, soft, non-tender  Pelvic: Cervical exam deferred  Dilation: Fingertip Effacement (%): Thick Station: -3  Chaperone: Engineer, site    Fetal Status:     Movement: Present Presentation: Vertex  Fetal Surveillance Testing today: US : GA = 36+1 weeks Single active fetus, cephalic, FHR = 134 bpm, AFI = 9.6 cm, 14%, MVP = 3.5 cm,  EFW 40%, 2874g, BPP = , UAD RI: 0.52, 0.54  32.6%    NST: FHR baseline 130 bpm, Variability: moderate, Accelerations:present, Decelerations:  Absent= Cat 1/Reactive    No results found for this or any previous visit (from the past 24 hours).  Assessment & Plan:  High-risk pregnancy: [id number] at [redacted]w[redacted]d with an  Estimated Date of Delivery: 06/02/24   1. [redacted] weeks gestation of pregnancy (Primary)   2. History of cesarean section, low transverse Plans TOLAC  3. Chronic hypertension affecting pregnancy BP stable BPP 8/10 (-2 for breathing) NST very reactive  4. Supervision of high risk pregnancy in third trimester     Meds: No orders of the defined types were placed in this encounter.   Orders:  Orders Placed This Encounter  Procedures   Culture, beta strep (group b only)     Labs/procedures today: GBS, SVE, NST, and U/S   Reviewed: Term labor symptoms and general obstetric precautions including but not limited to vaginal bleeding, contractions, leaking of fluid and fetal movement were reviewed in detail with the patient.  All questions were answered. Does have home bp cuff. Office bp  cuff given: not applicable. Check bp daily, let us  know if consistently >140 and/or >90.  Follow-up: Return for As scheduled and cancel appts after 6/18 (IOL that day).   Future Appointments  Date Time Provider Department Center  05/13/2024 10:50 AM [facility] NURSE [facility]  05/17/2024 10:50 AM Ozan, Jennifer, DO [facility]  05/19/2024  6:30 AM [facility] SCHED ROOM [facility] None    Orders Placed This Encounter  Procedures   Culture, beta strep (group b only)   Majel Scott , DNP, CNM [facility] 05/10/2024 1:35 PM

## 2024-05-13 ENCOUNTER — Ambulatory Visit: Admitting: *Deleted

## 2024-05-13 VITALS — BP 132/90 | HR 106

## 2024-05-13 DIAGNOSIS — O0993 Supervision of high risk pregnancy, unspecified, third trimester: Secondary | ICD-10-CM

## 2024-05-13 DIAGNOSIS — Z3A37 37 weeks gestation of pregnancy: Secondary | ICD-10-CM

## 2024-05-13 DIAGNOSIS — O10913 Unspecified pre-existing hypertension complicating pregnancy, third trimester: Secondary | ICD-10-CM | POA: Diagnosis not present

## 2024-05-13 DIAGNOSIS — O10919 Unspecified pre-existing hypertension complicating pregnancy, unspecified trimester: Secondary | ICD-10-CM

## 2024-05-13 LAB — CULTURE, BETA STREP (GROUP B ONLY)

## 2024-05-13 NOTE — Progress Notes (Signed)
   NURSE VISIT- NST  SUBJECTIVE:  Lauren Ramirez is a 38 y.o. [phone] female at [redacted]w[redacted]d, here for a NST for pregnancy complicated by [facility].  She reports active fetal movement, contractions: none, vaginal bleeding: none, membranes: intact.   OBJECTIVE:  BP (!) 132/90   Pulse (!) 106   LMP 08/27/2023   Appears well, no apparent distress  No results found for this or any previous visit (from the past 24 hours).  NST: FHR baseline 140 bpm, Variability: moderate, Accelerations:present, Decelerations:  Absent= Cat 1/reactive Toco: none   ASSESSMENT: [id number] at [redacted]w[redacted]d with CHTN NST reactive  PLAN: EFM strip reviewed by Dr. Ozan   Recommendations: keep next appointment as scheduled    Lauren Ramirez  05/13/2024 11:31 AM

## 2024-05-14 ENCOUNTER — Telehealth (HOSPITAL_COMMUNITY): Payer: Self-pay | Admitting: *Deleted

## 2024-05-14 NOTE — Telephone Encounter (Signed)
 Preadmission screen

## 2024-05-17 ENCOUNTER — Encounter: Payer: Self-pay | Admitting: Obstetrics & Gynecology

## 2024-05-17 ENCOUNTER — Ambulatory Visit: Admitting: Obstetrics & Gynecology

## 2024-05-17 ENCOUNTER — Other Ambulatory Visit: Admitting: Radiology

## 2024-05-17 ENCOUNTER — Other Ambulatory Visit: Payer: Medicaid Other

## 2024-05-17 VITALS — BP 136/87 | HR 105 | Wt 240.0 lb

## 2024-05-17 DIAGNOSIS — Z3A37 37 weeks gestation of pregnancy: Secondary | ICD-10-CM | POA: Diagnosis not present

## 2024-05-17 DIAGNOSIS — O0993 Supervision of high risk pregnancy, unspecified, third trimester: Secondary | ICD-10-CM

## 2024-05-17 DIAGNOSIS — Z98891 History of uterine scar from previous surgery: Secondary | ICD-10-CM | POA: Diagnosis not present

## 2024-05-17 DIAGNOSIS — O10913 Unspecified pre-existing hypertension complicating pregnancy, third trimester: Secondary | ICD-10-CM

## 2024-05-17 DIAGNOSIS — O10919 Unspecified pre-existing hypertension complicating pregnancy, unspecified trimester: Secondary | ICD-10-CM

## 2024-05-17 NOTE — Progress Notes (Signed)
 HIGH-RISK PREGNANCY VISIT Patient name: Lauren Ramirez MRN [id number]  Date of birth: [birth year] Chief Complaint:   Routine Prenatal Visit  History of Present Illness:   Lauren Ramirez is a 38 y.o. [id number] female at [redacted]w[redacted]d with an Estimated Date of Delivery: 06/02/24 being seen today for ongoing management of a high-risk pregnancy complicated by:  -Chronic HTN Stable, no meds, Asx  -prior C-section, prior VBAC, desires VBAC  Today she reports no complaints.   Contractions: Irritability. Vag. Bleeding: None.  Movement: Present. denies leaking of fluid.      04/08/2024    9:43 AM 11/19/2023    3:29 PM 10/09/2023    2:11 PM 07/23/2018   11:56 AM 02/20/2017    9:38 AM  Depression screen PHQ 2/9  Decreased Interest 0 1 0 2 0  Down, Depressed, Hopeless 0 1 0 2 0  PHQ - 2 Score 0 2 0 4 0  Altered sleeping 0 0 0 0   Tired, decreased energy 1 1 1 3    Change in appetite 0 0 0 3   Feeling bad or failure about yourself  0 0 0 0   Trouble concentrating 0 1 0 0   Moving slowly or fidgety/restless 0 0 0 0   Suicidal thoughts 0 0 0 0   PHQ-9 Score 1 4 1 10       Current Outpatient Medications  Medication Instructions   acetaminophen  (TYLENOL ) 500 mg, Every 6 hours PRN   aspirin  EC 81 mg, Oral, Daily, Swallow whole.   cholecalciferol (VITAMIN D3) 1,000 Units, Daily   cyanocobalamin (VITAMIN B12) 1000 MCG/ML injection Inject into the muscle.   fluticasone  (FLONASE ) 50 MCG/ACT nasal spray 1 spray, Each Nare, Daily   Multiple Vitamin (MULTIVITAMIN) tablet 1 tablet, Daily   NIFEdipine  (PROCARDIA  XL) 30 mg, Oral, Daily   ondansetron  (ZOFRAN -ODT) 4 MG disintegrating tablet TAKE 1 TABLET BY MOUTH EVERY 8 HOURS AS NEEDED FOR NAUSEA AND VOMITING   progesterone  (PROMETRIUM ) 200 mg, Vaginal, Daily at bedtime   terconazole  (TERAZOL 7 ) 0.4 % vaginal cream 1 applicator, Vaginal, Daily at bedtime     Review of Systems:   Pertinent items are noted in HPI Denies abnormal vaginal discharge  w/ itching/odor/irritation, headaches, visual changes, shortness of breath, chest pain, abdominal pain, severe nausea/vomiting, or problems with urination or bowel movements unless otherwise stated above. Pertinent History Reviewed:  Reviewed past medical,surgical, social, obstetrical and family history.  Reviewed problem list, medications and allergies. Physical Assessment:   Vitals:   05/17/24 1116  BP: 136/87  Pulse: (!) 105  Weight: 240 lb (108.9 kg)  Body mass index is 38.74 kg/m.           Physical Examination:   General appearance: alert, well appearing, and in no distress  Mental status: normal mood, behavior, speech, dress, motor activity, and thought processes  Skin: warm & dry   Extremities:      Cardiovascular: normal heart rate noted  Respiratory: normal respiratory effort, no distress  Abdomen: gravid, soft, non-tender  Pelvic: Cervical exam performed         Fetal Status:     Movement: Present   Initial FHT: 165bpm, plan for NST today  Fetal Surveillance Testing today: NST  NST being performed due to Chronic HTN and fetal tachycardia   Fetal Monitoring:  Baseline: 150 bpm, Variability: moderate, Accelerations: present, The accelerations are >15 bpm and more than 2 in 20 minutes, and Decelerations: Absent     Final diagnosis:  Reactive NST      Chaperone: pt declined    No results found for this or any previous visit (from the past 24 hours).   Assessment & Plan:  High-risk pregnancy: [id number] at [redacted]w[redacted]d with an Estimated Date of Delivery: 06/02/24   1) Chronic HTN Stable, IOL scheduled for 6/18  2) Prior C-section and VBAC, desires VBAC   Meds: No orders of the defined types were placed in this encounter.   Labs/procedures today: NST  Treatment Plan:  IOL scheduled for later this week  Reviewed: Term labor symptoms and general obstetric precautions including but not limited to vaginal bleeding, contractions, leaking of fluid and fetal movement  were reviewed in detail with the patient.  All questions were answered. Pt has home bp cuff. Check bp weekly, let us  know if >140/90.   Follow-up: Return in about 1 week (around 05/24/2024).   Future Appointments  Date Time Provider Department Center  05/19/2024  6:30 AM [facility] SCHED ROOM [facility] None    No orders of the defined types were placed in this encounter.   Shailey Butterbaugh, DO Attending Obstetrician & Gynecologist, [facility] for [employer], [facility]

## 2024-05-18 ENCOUNTER — Encounter (HOSPITAL_COMMUNITY): Payer: Self-pay | Admitting: *Deleted

## 2024-05-18 ENCOUNTER — Other Ambulatory Visit: Payer: Self-pay | Admitting: Obstetrics and Gynecology

## 2024-05-18 ENCOUNTER — Telehealth (HOSPITAL_COMMUNITY): Payer: Self-pay | Admitting: *Deleted

## 2024-05-18 DIAGNOSIS — O10919 Unspecified pre-existing hypertension complicating pregnancy, unspecified trimester: Secondary | ICD-10-CM

## 2024-05-18 NOTE — Telephone Encounter (Signed)
 Preadmission screen

## 2024-05-19 ENCOUNTER — Encounter (HOSPITAL_COMMUNITY): Payer: Self-pay | Admitting: Family Medicine

## 2024-05-19 ENCOUNTER — Other Ambulatory Visit: Payer: Self-pay

## 2024-05-19 ENCOUNTER — Inpatient Hospital Stay (HOSPITAL_COMMUNITY)

## 2024-05-19 ENCOUNTER — Inpatient Hospital Stay (HOSPITAL_COMMUNITY)
Admission: RE | Admit: 2024-05-19 | Discharge: 2024-05-22 | DRG: 786 | Disposition: A | Discharge status: Home / Self Care | Attending: Family Medicine | Admitting: Family Medicine

## 2024-05-19 DIAGNOSIS — Z3A38 38 weeks gestation of pregnancy: Secondary | ICD-10-CM | POA: Diagnosis not present

## 2024-05-19 DIAGNOSIS — O1092 Unspecified pre-existing hypertension complicating childbirth: Secondary | ICD-10-CM | POA: Diagnosis not present

## 2024-05-19 DIAGNOSIS — Z6836 Body mass index (BMI) 36.0-36.9, adult: Secondary | ICD-10-CM

## 2024-05-19 DIAGNOSIS — K5090 Crohn's disease, unspecified, without complications: Secondary | ICD-10-CM | POA: Diagnosis not present

## 2024-05-19 DIAGNOSIS — Z8751 Personal history of pre-term labor: Secondary | ICD-10-CM

## 2024-05-19 DIAGNOSIS — O09529 Supervision of elderly multigravida, unspecified trimester: Secondary | ICD-10-CM

## 2024-05-19 DIAGNOSIS — O10919 Unspecified pre-existing hypertension complicating pregnancy, unspecified trimester: Secondary | ICD-10-CM | POA: Diagnosis present

## 2024-05-19 DIAGNOSIS — Z833 Family history of diabetes mellitus: Secondary | ICD-10-CM

## 2024-05-19 DIAGNOSIS — O0993 Supervision of high risk pregnancy, unspecified, third trimester: Principal | ICD-10-CM

## 2024-05-19 DIAGNOSIS — Z8759 Personal history of other complications of pregnancy, childbirth and the puerperium: Secondary | ICD-10-CM

## 2024-05-19 DIAGNOSIS — Z87891 Personal history of nicotine dependence: Secondary | ICD-10-CM

## 2024-05-19 DIAGNOSIS — O134 Gestational [pregnancy-induced] hypertension without significant proteinuria, complicating childbirth: Secondary | ICD-10-CM | POA: Diagnosis present

## 2024-05-19 DIAGNOSIS — O9962 Diseases of the digestive system complicating childbirth: Secondary | ICD-10-CM | POA: Diagnosis present

## 2024-05-19 DIAGNOSIS — O34211 Maternal care for low transverse scar from previous cesarean delivery: Secondary | ICD-10-CM | POA: Diagnosis not present

## 2024-05-19 DIAGNOSIS — O411230 Chorioamnionitis, third trimester, not applicable or unspecified: Secondary | ICD-10-CM | POA: Diagnosis present

## 2024-05-19 DIAGNOSIS — Z8719 Personal history of other diseases of the digestive system: Secondary | ICD-10-CM

## 2024-05-19 DIAGNOSIS — I10 Essential (primary) hypertension: Secondary | ICD-10-CM | POA: Diagnosis present

## 2024-05-19 DIAGNOSIS — Z8249 Family history of ischemic heart disease and other diseases of the circulatory system: Secondary | ICD-10-CM

## 2024-05-19 DIAGNOSIS — Z98891 History of uterine scar from previous surgery: Secondary | ICD-10-CM

## 2024-05-19 LAB — TYPE AND SCREEN
ABO/RH(D): O POS
Antibody Screen: NEGATIVE

## 2024-05-19 LAB — COMPREHENSIVE METABOLIC PANEL WITH GFR
ALT: 17 U/L (ref 0–44)
AST: 23 U/L (ref 15–41)
Albumin: 2.5 g/dL — ABNORMAL LOW (ref 3.5–5.0)
Alkaline Phosphatase: 147 U/L — ABNORMAL HIGH (ref 38–126)
Anion gap: 10 (ref 5–15)
BUN: 5 mg/dL — ABNORMAL LOW (ref 6–20)
CO2: 19 mmol/L — ABNORMAL LOW (ref 22–32)
Calcium: 8.7 mg/dL — ABNORMAL LOW (ref 8.9–10.3)
Chloride: 106 mmol/L (ref 98–111)
Creatinine, Ser: 0.65 mg/dL (ref 0.44–1.00)
GFR, Estimated: >60 mL/min (ref >60)
Glucose, Bld: 102 mg/dL — ABNORMAL HIGH (ref 70–99)
Potassium: 3.2 mmol/L — ABNORMAL LOW (ref 3.5–5.1)
Sodium: 135 mmol/L (ref 135–145)
Total Bilirubin: 0.8 mg/dL (ref 0.0–1.2)
Total Protein: 6.5 g/dL (ref 6.5–8.1)

## 2024-05-19 LAB — CBC
HCT: 34.4 % — ABNORMAL LOW (ref 36.0–46.0)
Hemoglobin: 11.5 g/dL — ABNORMAL LOW (ref 12.0–15.0)
MCH: 27.6 pg (ref 26.0–34.0)
MCHC: 33.4 g/dL (ref 30.0–36.0)
MCV: 82.5 fL (ref 80.0–100.0)
Platelets: 278 10*3/uL (ref 150–400)
RBC: 4.17 MIL/uL (ref 3.87–5.11)
RDW: 13.7 % (ref 11.5–15.5)
WBC: 9.2 10*3/uL (ref 4.0–10.5)
nRBC: 0 % (ref 0.0–0.2)

## 2024-05-19 LAB — PROTEIN / CREATININE RATIO, URINE
Creatinine, Urine: 244 mg/dL
Protein Creatinine Ratio: 0.12 mg/mg{creat} (ref 0.00–0.15)
Total Protein, Urine: 30 mg/dL

## 2024-05-19 MED ORDER — ACETAMINOPHEN 325 MG PO TABS: 650.0000 mg | ORAL_TABLET | ORAL | Status: DC | PRN

## 2024-05-19 MED ORDER — ONDANSETRON HCL 4 MG/2ML IJ SOLN
4.0000 mg | Freq: Four times a day (QID) | INTRAMUSCULAR | Status: DC | PRN
  Administered 2024-05-20 (×2): 4 mg via INTRAVENOUS
  Filled 2024-05-19: qty 2

## 2024-05-19 MED ORDER — OXYTOCIN BOLUS FROM INFUSION
333.0000 mL | Freq: Once | INTRAVENOUS | Status: AC
  Administered 2024-05-20: 300 mL via INTRAVENOUS

## 2024-05-19 MED ORDER — OXYTOCIN-SODIUM CHLORIDE 30-0.9 UT/500ML-% IV SOLN: 2.5000 [IU]/h | INTRAVENOUS | Status: DC

## 2024-05-19 MED ORDER — OXYTOCIN-SODIUM CHLORIDE 30-0.9 UT/500ML-% IV SOLN: 1.0000 m[IU]/min | INTRAVENOUS | Status: DC

## 2024-05-19 MED ORDER — SOD CITRATE-CITRIC ACID 500-334 MG/5ML PO SOLN
30.0000 mL | ORAL | Status: DC | PRN
  Administered 2024-05-20: 30 mL via ORAL
  Filled 2024-05-19: qty 30

## 2024-05-19 MED ORDER — TERBUTALINE SULFATE 1 MG/ML IJ SOLN
0.2500 mg | Freq: Once | INTRAMUSCULAR | Status: AC | PRN
  Administered 2024-05-20: 0.25 mg via SUBCUTANEOUS
  Filled 2024-05-19: qty 1

## 2024-05-19 MED ORDER — LACTATED RINGERS IV SOLN: INTRAVENOUS | Status: DC

## 2024-05-19 MED ORDER — LIDOCAINE HCL (PF) 1 % IJ SOLN: 30.0000 mL | INTRAMUSCULAR | Status: DC | PRN

## 2024-05-19 MED ORDER — LACTATED RINGERS IV SOLN: 500.0000 mL | INTRAVENOUS | Status: DC | PRN

## 2024-05-19 NOTE — H&P (Signed)
 LABOR AND DELIVERY ADMISSION HISTORY AND PHYSICAL NOTE  Lauren Ramirez is a 38 y.o. female [phone] with IUP at [redacted]w[redacted]d presenting for IOL for cHTN.   Patient reports the fetal movement as active. Patient reports uterine contraction activity as rare. Patient reports vaginal bleeding as none. Patient describes fluid per vagina as None.   Patient denies headache, vision changes, chest pain, shortness of breath, right upper quadrant pain, or LE edema.  She plans on bottle feeding. Her contraception plan is: considering Nexplanon  Prenatal History/Complications: [facility] - Hx of 2 preterm vaginal deliveries, C/S for breech preterm twins, VBAC 21 wk IUFD - cHTN on no medications - Crohn's  Sono:  @[redacted]w[redacted]d , CWD, normal anatomy, cephalic presentation, posterior right lateral placenta, 41%ile  Pregnancy complications:  Patient Active Problem List   Diagnosis Date Noted   BMI 36.0-36.9,adult 01/19/2024   Abnormal Pap smear of cervix 12/24/2023   Chronic hypertension affecting pregnancy 12/18/2023   Supervision of high risk pregnancy in third trimester 11/19/2023   AMA (advanced maternal age) multigravida 35+ 10/09/2023   History of IUFD 09/07/2018   Exacerbation of Crohn's disease (HCC) 07/28/2018   History of preterm delivery 05/26/2018   History of cesarean section, low transverse 05/26/2018   History of Crohn's disease 05/26/2018    Past Medical History: Past Medical History:  Diagnosis Date   Abnormal Pap smear    ASCUS 2011   Crohn's disease (HCC)    ostomy 06/2019   History of preterm delivery, currently pregnant in first trimester 05/26/2018   32wk, 36wk, and 35 week twins.  17p   Hypertension    Vaginal Pap smear, abnormal     Past Surgical History: Past Surgical History:  Procedure Laterality Date   CESAREAN SECTION  04/14/2012   Procedure: CESAREAN SECTION;  Surgeon: Julianne Octave, MD;  Location: [facility];  Service: Gynecology;  Laterality: N/A;   primary   DILATION AND CURETTAGE OF UTERUS     HEMICOLECTOMY W/ OSTOMY  06/2019   done in [facility]   SMALL INTESTINE SURGERY      Obstetrical History: OB History     Gravida  6   Para  4   Term  0   Preterm  4   AB  1   Living  4      SAB  0   IAB  1   Ectopic  0   Multiple  1   Live Births  4           Social History: Social History   Socioeconomic History   Marital status: Significant Other    Spouse name: Not on file   Number of children: Not on file   Years of education: Not on file   Highest education level: Some college, no degree  Occupational History   Occupation: customer service  Tobacco Use   Smoking status: Former    Current packs/day: 0.00    Types: Cigarettes    Quit date: 04/16/2007    Years since quitting: 17.1   Smokeless tobacco: Never   Tobacco comments:    smokes 4-5 cig daily  Vaping Use   Vaping status: Former  Substance and Sexual Activity   Alcohol use: Not Currently    Comment: only occasionally; before pregnancy   Drug use: No   Sexual activity: Yes    Birth control/protection: None  Other Topics Concern   Not on file  Social History Narrative   Not on file  Social Drivers of Corporate investment banker Strain: Low Risk  (10/09/2023)   Overall Financial Resource Strain (CARDIA)    Difficulty of Paying Living Expenses: Not hard at all  Food Insecurity: No Food Insecurity (10/09/2023)   Hunger Vital Sign    Worried About Running Out of Food in the Last Year: Never true    Ran Out of Food in the Last Year: Never true  Transportation Needs: No Transportation Needs (10/09/2023)   PRAPARE - Administrator, Civil Service (Medical): No    Lack of Transportation (Non-Medical): No  Physical Activity: Insufficiently Active (10/09/2023)   Exercise Vital Sign    Days of Exercise per Week: 3 days    Minutes of Exercise per Session: 10 min  Stress: No Stress Concern Present (10/09/2023)   [employer] of  Occupational Health - Occupational Stress Questionnaire    Feeling of Stress : Only a little  Social Connections: Moderately Integrated (10/09/2023)   Social Connection and Isolation Panel    Frequency of Communication with Friends and Family: Three times a week    Frequency of Social Gatherings with Friends and Family: Once a week    Attends Religious Services: More than 4 times per year    Active Member of [employer] or Organizations: No    Attends Engineer, structural: Never    Marital Status: Living with partner    Family History: Family History  Problem Relation Age of Onset   Hypertension Mother    Crohn's disease Mother    Hypertension Maternal Grandmother    Diabetes Maternal Grandmother    Cancer Maternal Grandfather    Crohn's disease Brother    Anesthesia problems Neg Hx     Allergies: No Known Allergies  Medications Prior to Admission  Medication Sig Dispense Refill Last Dose/Taking   acetaminophen  (TYLENOL ) 500 MG tablet Take 500 mg by mouth every 6 (six) hours as needed for mild pain (pain score 1-3).      aspirin  EC 81 MG tablet Take 1 tablet (81 mg total) by mouth daily. Swallow whole. (Patient not taking: Reported on 05/17/2024) 30 tablet 12    cholecalciferol (VITAMIN D3) 25 MCG (1000 UNIT) tablet Take 1,000 Units by mouth daily.      cyanocobalamin (VITAMIN B12) 1000 MCG/ML injection Inject into the muscle.      fluticasone  (FLONASE ) 50 MCG/ACT nasal spray Place 1 spray into both nostrils daily. (Patient not taking: Reported on 05/17/2024) 1 g 0    Multiple Vitamin (MULTIVITAMIN) tablet Take 1 tablet by mouth daily. (Patient not taking: Reported on 05/17/2024)      NIFEdipine  (PROCARDIA  XL) 30 MG 24 hr tablet Take 1 tablet (30 mg total) by mouth daily. (Patient not taking: Reported on 05/17/2024) 90 tablet 3    ondansetron  (ZOFRAN -ODT) 4 MG disintegrating tablet TAKE 1 TABLET BY MOUTH EVERY 8 HOURS AS NEEDED FOR NAUSEA AND VOMITING 30 tablet 1    progesterone   (PROMETRIUM ) 200 MG capsule Place 1 capsule (200 mg total) vaginally at bedtime. (Patient not taking: Reported on 05/17/2024) 30 capsule 5    terconazole  (TERAZOL 7 ) 0.4 % vaginal cream Place 1 applicator vaginally at bedtime. (Patient not taking: Reported on 05/17/2024) 45 g 0      Review of Systems  All systems reviewed and negative except as stated in HPI  Physical Exam BP 132/85   Pulse (!) 108   Temp 97.7 F (36.5 C) (Oral)   Resp 18   Ht 5'  6 (1.676 m)   Wt 109 kg   LMP 08/27/2023   BMI 38.77 kg/m   Physical Exam Constitutional:      Appearance: Normal appearance.   Cardiovascular:     Rate and Rhythm: Normal rate and regular rhythm.     Heart sounds: Normal heart sounds.  Pulmonary:     Effort: Pulmonary effort is normal.     Breath sounds: Normal breath sounds.  Abdominal:     Comments: Gravid, soft and non-tender   Musculoskeletal:        General: Normal range of motion.     Cervical back: Neck supple.   Skin:    General: Skin is warm and dry.   Neurological:     Mental Status: She is alert and oriented to person, place, and time.   Psychiatric:        Mood and Affect: Mood normal.        Behavior: Behavior normal.     Presentation: cephalic by exam  Fetal monitoring: Baseline: 150 bpm, Variability: moderate, Accelerations: +15x15 accels x 2, and Decelerations: Absent Uterine activity: irregular  SVE: 1.5/20/-3.  Cook balloon placed, 40cc    Prenatal labs: ABO, Rh: O/Positive/-- (12/18 1610) Antibody: Negative (05/08 0859) Rubella: 1.58 (12/18 1610) RPR: Non Reactive (05/08 0859)  HBsAg: Negative (12/18 1610)  HIV: Non Reactive (05/08 0859)  GC/Chlamydia:  Neisseria Gonorrhea  Date Value Ref Range Status  04/29/2024 Negative  Final   Chlamydia  Date Value Ref Range Status  04/29/2024 Negative  Final   GBS: Negative/-- (06/09 1535)   Prenatal Transfer Tool  Maternal Diabetes: No Genetic Screening: Normal Maternal  Ultrasounds/Referrals: Normal Fetal Ultrasounds or other Referrals:  None Maternal Substance Abuse:  No Significant Maternal Medications:  None Significant Maternal Lab Results: Group B Strep negative  No results found for this or any previous visit (from the past 24 hours).  Assessment: Lauren Ramirez is a 38 y.o. [phone] at [redacted]w[redacted]d here for IOL for cHTN (no medications. For TOLAC, prior VBAC with 21 week IUFD.  #Labor: Cook balloon in place, plan to start Pitocin  if FHT remains reassuring #Pain: IV pain meds PRN, epidural upon request #FHT: Category I #GBS/ID: Negative #MOF: bottle feeding #MOC: likely Nexplanon  #cHTN: no medications. CBC, CMP, P:C at admission. Lasix/K PP BP currently stable, pt asymptomatic  Zaki Gertsch, DO Attending Obstetrician & Gynecologist, [facility] for [employer], [facility]

## 2024-05-20 ENCOUNTER — Encounter (HOSPITAL_COMMUNITY)
Admission: RE | Disposition: A | Discharge status: Home / Self Care | Payer: Self-pay | Source: Home / Self Care | Attending: Family Medicine

## 2024-05-20 ENCOUNTER — Inpatient Hospital Stay (HOSPITAL_COMMUNITY): Admitting: Anesthesiology

## 2024-05-20 ENCOUNTER — Encounter (HOSPITAL_COMMUNITY): Payer: Self-pay | Admitting: Obstetrics and Gynecology

## 2024-05-20 ENCOUNTER — Other Ambulatory Visit: Payer: Self-pay

## 2024-05-20 ENCOUNTER — Other Ambulatory Visit

## 2024-05-20 DIAGNOSIS — Z98891 History of uterine scar from previous surgery: Secondary | ICD-10-CM

## 2024-05-20 DIAGNOSIS — Z3A38 38 weeks gestation of pregnancy: Secondary | ICD-10-CM

## 2024-05-20 DIAGNOSIS — O34211 Maternal care for low transverse scar from previous cesarean delivery: Secondary | ICD-10-CM | POA: Diagnosis not present

## 2024-05-20 LAB — CBC
HCT: 34.6 % — ABNORMAL LOW (ref 36.0–46.0)
Hemoglobin: 11.6 g/dL — ABNORMAL LOW (ref 12.0–15.0)
MCH: 27.8 pg (ref 26.0–34.0)
MCHC: 33.5 g/dL (ref 30.0–36.0)
MCV: 82.8 fL (ref 80.0–100.0)
Platelets: 234 10*3/uL (ref 150–400)
RBC: 4.18 MIL/uL (ref 3.87–5.11)
RDW: 13.8 % (ref 11.5–15.5)
WBC: 14.5 10*3/uL — ABNORMAL HIGH (ref 4.0–10.5)
nRBC: 0 % (ref 0.0–0.2)

## 2024-05-20 LAB — CREATININE, SERUM
Creatinine, Ser: 0.78 mg/dL (ref 0.44–1.00)
GFR, Estimated: >60 mL/min (ref >60)

## 2024-05-20 LAB — RPR: RPR Ser Ql: NONREACTIVE

## 2024-05-20 SURGERY — Surgical Case
Anesthesia: Regional | Site: Abdomen

## 2024-05-20 MED ORDER — DROPERIDOL 2.5 MG/ML IJ SOLN: 0.6250 mg | Freq: Once | INTRAMUSCULAR | Status: DC | PRN

## 2024-05-20 MED ORDER — PHENYLEPHRINE 80 MCG/ML (10ML) SYRINGE FOR IV PUSH (FOR BLOOD PRESSURE SUPPORT)
80.0000 ug | PREFILLED_SYRINGE | INTRAVENOUS | Status: DC | PRN
  Filled 2024-05-20: qty 10

## 2024-05-20 MED ORDER — LACTATED RINGERS AMNIOINFUSION: INTRAVENOUS | Status: DC

## 2024-05-20 MED ORDER — ACETAMINOPHEN 500 MG PO TABS
1000.0000 mg | ORAL_TABLET | Freq: Four times a day (QID) | ORAL | Status: DC
  Administered 2024-05-21 – 2024-05-22 (×2): 1000 mg via ORAL
  Filled 2024-05-20 (×3): qty 2

## 2024-05-20 MED ORDER — FENTANYL CITRATE (PF) 100 MCG/2ML IJ SOLN
INTRAMUSCULAR | Status: AC
Start: 2024-05-20 — End: 2024-05-20
  Filled 2024-05-20: qty 2

## 2024-05-20 MED ORDER — SODIUM CHLORIDE 0.9% FLUSH
3.0000 mL | Freq: Two times a day (BID) | INTRAVENOUS | Status: DC
  Administered 2024-05-21: 10 mL via INTRAVENOUS

## 2024-05-20 MED ORDER — CLINDAMYCIN PHOSPHATE 900 MG/50ML IV SOLN
900.0000 mg | Freq: Three times a day (TID) | INTRAVENOUS | Status: AC
  Administered 2024-05-20 – 2024-05-21 (×3): 900 mg via INTRAVENOUS
  Filled 2024-05-20 (×3): qty 50

## 2024-05-20 MED ORDER — ACETAMINOPHEN 500 MG PO TABS
1000.0000 mg | ORAL_TABLET | Freq: Four times a day (QID) | ORAL | Status: DC
Start: 2024-05-20 — End: 2024-05-20

## 2024-05-20 MED ORDER — FUROSEMIDE 20 MG PO TABS
20.0000 mg | ORAL_TABLET | Freq: Every day | ORAL | Status: DC
  Filled 2024-05-20: qty 1

## 2024-05-20 MED ORDER — DIPHENHYDRAMINE HCL 50 MG/ML IJ SOLN: 12.5000 mg | INTRAMUSCULAR | Status: DC | PRN

## 2024-05-20 MED ORDER — NIFEDIPINE ER OSMOTIC RELEASE 30 MG PO TB24
30.0000 mg | ORAL_TABLET | Freq: Every day | ORAL | Status: DC
  Filled 2024-05-20: qty 1

## 2024-05-20 MED ORDER — CEFAZOLIN SODIUM-DEXTROSE 2-3 GM-%(50ML) IV SOLR
INTRAVENOUS | Status: DC | PRN
  Administered 2024-05-20: 2 g via INTRAVENOUS

## 2024-05-20 MED ORDER — SODIUM CHLORIDE 0.9 % IV SOLN
INTRAVENOUS | Status: AC
Start: 2024-05-20 — End: 2024-05-20
  Filled 2024-05-20: qty 5

## 2024-05-20 MED ORDER — OXYTOCIN-SODIUM CHLORIDE 30-0.9 UT/500ML-% IV SOLN: 2.5000 [IU]/h | INTRAVENOUS | Status: AC

## 2024-05-20 MED ORDER — OXYTOCIN-SODIUM CHLORIDE 30-0.9 UT/500ML-% IV SOLN
1.0000 m[IU]/min | INTRAVENOUS | Status: DC
  Filled 2024-05-20: qty 500

## 2024-05-20 MED ORDER — PRENATAL MULTIVITAMIN CH
1.0000 | ORAL_TABLET | Freq: Every day | ORAL | Status: DC
  Filled 2024-05-20 (×3): qty 1

## 2024-05-20 MED ORDER — POTASSIUM CHLORIDE CRYS ER 20 MEQ PO TBCR
40.0000 meq | EXTENDED_RELEASE_TABLET | Freq: Every day | ORAL | Status: DC
  Filled 2024-05-20: qty 2

## 2024-05-20 MED ORDER — PHENYLEPHRINE 80 MCG/ML (10ML) SYRINGE FOR IV PUSH (FOR BLOOD PRESSURE SUPPORT)
80.0000 ug | PREFILLED_SYRINGE | INTRAVENOUS | Status: DC | PRN
  Administered 2024-05-20: 80 ug via INTRAVENOUS

## 2024-05-20 MED ORDER — KETOROLAC TROMETHAMINE 30 MG/ML IJ SOLN
30.0000 mg | Freq: Once | INTRAMUSCULAR | Status: AC
Start: 2024-05-20 — End: 2024-05-20
  Administered 2024-05-20: 30 mg via INTRAVENOUS

## 2024-05-20 MED ORDER — SODIUM CHLORIDE 0.9 % IV SOLN
2.0000 g | Freq: Four times a day (QID) | INTRAVENOUS | Status: AC
  Administered 2024-05-20 – 2024-05-21 (×4): 2 g via INTRAVENOUS
  Filled 2024-05-20 (×4): qty 2000

## 2024-05-20 MED ORDER — IBUPROFEN 600 MG PO TABS
600.0000 mg | ORAL_TABLET | Freq: Four times a day (QID) | ORAL | Status: DC
  Administered 2024-05-21 – 2024-05-22 (×2): 600 mg via ORAL
  Filled 2024-05-20 (×2): qty 1

## 2024-05-20 MED ORDER — DIBUCAINE (PERIANAL) 1 % EX OINT: 1.0000 | TOPICAL_OINTMENT | CUTANEOUS | Status: DC | PRN

## 2024-05-20 MED ORDER — ONDANSETRON HCL 4 MG/2ML IJ SOLN: 4.0000 mg | Freq: Three times a day (TID) | INTRAMUSCULAR | Status: DC | PRN

## 2024-05-20 MED ORDER — DIPHENHYDRAMINE HCL 25 MG PO CAPS: 25.0000 mg | ORAL_CAPSULE | ORAL | Status: DC | PRN

## 2024-05-20 MED ORDER — SIMETHICONE 80 MG PO CHEW: 80.0000 mg | CHEWABLE_TABLET | ORAL | Status: DC | PRN

## 2024-05-20 MED ORDER — WITCH HAZEL-GLYCERIN EX PADS: 1.0000 | MEDICATED_PAD | CUTANEOUS | Status: DC | PRN

## 2024-05-20 MED ORDER — TRANEXAMIC ACID-NACL 1000-0.7 MG/100ML-% IV SOLN: INTRAVENOUS | Status: DC | PRN

## 2024-05-20 MED ORDER — LACTATED RINGERS IV SOLN: 500.0000 mL | Freq: Once | INTRAVENOUS | Status: DC

## 2024-05-20 MED ORDER — SODIUM CHLORIDE 0.9% FLUSH: 3.0000 mL | INTRAVENOUS | Status: DC | PRN

## 2024-05-20 MED ORDER — LIDOCAINE-EPINEPHRINE (PF) 2 %-1:200000 IJ SOLN
INTRAMUSCULAR | Status: DC | PRN
  Administered 2024-05-20 (×2): 5 mL via EPIDURAL

## 2024-05-20 MED ORDER — GENTAMICIN SULFATE 40 MG/ML IJ SOLN
5.0000 mg/kg | INTRAVENOUS | Status: DC
  Administered 2024-05-20: 400 mg via INTRAVENOUS
  Filled 2024-05-20: qty 10

## 2024-05-20 MED ORDER — MAGNESIUM HYDROXIDE 400 MG/5ML PO SUSP: 30.0000 mL | ORAL | Status: DC | PRN

## 2024-05-20 MED ORDER — GENTAMICIN SULFATE 40 MG/ML IJ SOLN: 5.0000 mg/kg | INTRAVENOUS | Status: DC

## 2024-05-20 MED ORDER — KETOROLAC TROMETHAMINE 30 MG/ML IJ SOLN
INTRAMUSCULAR | Status: AC
  Filled 2024-05-20: qty 1

## 2024-05-20 MED ORDER — SODIUM CHLORIDE 0.9 % IV SOLN
2.0000 g | Freq: Four times a day (QID) | INTRAVENOUS | Status: DC
  Administered 2024-05-20: 2 g via INTRAVENOUS
  Filled 2024-05-20: qty 2000

## 2024-05-20 MED ORDER — FENTANYL-BUPIVACAINE-NACL 0.5-0.125-0.9 MG/250ML-% EP SOLN
12.0000 mL/h | EPIDURAL | Status: DC | PRN
  Administered 2024-05-20: 12 mL/h via EPIDURAL
  Filled 2024-05-20: qty 250

## 2024-05-20 MED ORDER — TRANEXAMIC ACID-NACL 1000-0.7 MG/100ML-% IV SOLN
INTRAVENOUS | Status: AC
Start: 2024-05-20 — End: 2024-05-20
  Filled 2024-05-20: qty 100

## 2024-05-20 MED ORDER — KETOROLAC TROMETHAMINE 30 MG/ML IJ SOLN
30.0000 mg | Freq: Four times a day (QID) | INTRAMUSCULAR | Status: AC
  Administered 2024-05-21 (×3): 30 mg via INTRAVENOUS
  Filled 2024-05-20 (×3): qty 1

## 2024-05-20 MED ORDER — FERROUS SULFATE 325 (65 FE) MG PO TABS
325.0000 mg | ORAL_TABLET | ORAL | Status: DC
  Administered 2024-05-21: 325 mg via ORAL
  Filled 2024-05-20: qty 1

## 2024-05-20 MED ORDER — MORPHINE SULFATE (PF) 0.5 MG/ML IJ SOLN
INTRAMUSCULAR | Status: AC
  Filled 2024-05-20: qty 10

## 2024-05-20 MED ORDER — MENTHOL 3 MG MT LOZG: 1.0000 | LOZENGE | OROMUCOSAL | Status: DC | PRN

## 2024-05-20 MED ORDER — ENOXAPARIN SODIUM 60 MG/0.6ML IJ SOSY
50.0000 mg | PREFILLED_SYRINGE | INTRAMUSCULAR | Status: DC
  Administered 2024-05-21 – 2024-05-22 (×2): 50 mg via SUBCUTANEOUS
  Filled 2024-05-20 (×2): qty 0.6

## 2024-05-20 MED ORDER — SENNOSIDES-DOCUSATE SODIUM 8.6-50 MG PO TABS
2.0000 | ORAL_TABLET | Freq: Every day | ORAL | Status: DC
  Filled 2024-05-20: qty 2

## 2024-05-20 MED ORDER — OXYCODONE HCL 5 MG PO TABS: 5.0000 mg | ORAL_TABLET | Freq: Four times a day (QID) | ORAL | Status: DC | PRN

## 2024-05-20 MED ORDER — ONDANSETRON HCL 4 MG/2ML IJ SOLN
INTRAMUSCULAR | Status: AC
  Filled 2024-05-20: qty 2

## 2024-05-20 MED ORDER — FENTANYL CITRATE (PF) 100 MCG/2ML IJ SOLN
INTRAMUSCULAR | Status: AC
  Administered 2024-05-20: 50 ug via INTRAVENOUS
  Filled 2024-05-20: qty 2

## 2024-05-20 MED ORDER — TETANUS-DIPHTH-ACELL PERTUSSIS 5-2.5-18.5 LF-MCG/0.5 IM SUSY
0.5000 mL | PREFILLED_SYRINGE | Freq: Once | INTRAMUSCULAR | Status: AC
Start: 2024-05-21 — End: ?

## 2024-05-20 MED ORDER — BUPIVACAINE HCL (PF) 0.25 % IJ SOLN
INTRAMUSCULAR | Status: DC | PRN
  Administered 2024-05-20: 1.3 mL via INTRATHECAL

## 2024-05-20 MED ORDER — ACETAMINOPHEN 10 MG/ML IV SOLN
INTRAVENOUS | Status: AC
  Filled 2024-05-20: qty 100

## 2024-05-20 MED ORDER — SIMETHICONE 80 MG PO CHEW
80.0000 mg | CHEWABLE_TABLET | Freq: Three times a day (TID) | ORAL | Status: DC
  Administered 2024-05-21 (×2): 80 mg via ORAL
  Filled 2024-05-20 (×5): qty 1

## 2024-05-20 MED ORDER — COCONUT OIL OIL: 1.0000 | TOPICAL_OIL | Status: DC | PRN

## 2024-05-20 MED ORDER — OXYTOCIN-SODIUM CHLORIDE 30-0.9 UT/500ML-% IV SOLN: 1.0000 m[IU]/min | INTRAVENOUS | Status: DC

## 2024-05-20 MED ORDER — NALOXONE HCL 4 MG/10ML IJ SOLN: 1.0000 ug/kg/h | INTRAVENOUS | Status: DC | PRN

## 2024-05-20 MED ORDER — DEXAMETHASONE SODIUM PHOSPHATE 10 MG/ML IJ SOLN
INTRAMUSCULAR | Status: AC
Start: 2024-05-20 — End: 2024-05-20
  Filled 2024-05-20: qty 1

## 2024-05-20 MED ORDER — DIPHENHYDRAMINE HCL 25 MG PO CAPS: 25.0000 mg | ORAL_CAPSULE | Freq: Four times a day (QID) | ORAL | Status: DC | PRN

## 2024-05-20 MED ORDER — GABAPENTIN 100 MG PO CAPS
300.0000 mg | ORAL_CAPSULE | Freq: Three times a day (TID) | ORAL | Status: DC
  Filled 2024-05-20: qty 3

## 2024-05-20 MED ORDER — DEXAMETHASONE SODIUM PHOSPHATE 10 MG/ML IJ SOLN
INTRAMUSCULAR | Status: DC | PRN
  Administered 2024-05-20: 10 mg via INTRAVENOUS

## 2024-05-20 MED ORDER — KETOROLAC TROMETHAMINE 30 MG/ML IJ SOLN: 30.0000 mg | Freq: Four times a day (QID) | INTRAMUSCULAR | Status: DC | PRN

## 2024-05-20 MED ORDER — FENTANYL CITRATE (PF) 100 MCG/2ML IJ SOLN
50.0000 ug | INTRAMUSCULAR | Status: DC | PRN
  Administered 2024-05-20 (×2): 50 ug via INTRAVENOUS
  Administered 2024-05-20: 100 ug via INTRAVENOUS
  Filled 2024-05-20 (×3): qty 2

## 2024-05-20 MED ORDER — MORPHINE SULFATE (PF) 0.5 MG/ML IJ SOLN
INTRAMUSCULAR | Status: DC | PRN
  Administered 2024-05-20: 3 mg via EPIDURAL

## 2024-05-20 MED ORDER — SODIUM CHLORIDE 0.9 % IR SOLN
Status: DC | PRN
  Administered 2024-05-20: 1000 mL

## 2024-05-20 MED ORDER — STERILE WATER FOR IRRIGATION IR SOLN
Status: DC | PRN
  Administered 2024-05-20: 1000 mL

## 2024-05-20 MED ORDER — FENTANYL CITRATE (PF) 100 MCG/2ML IJ SOLN: 25.0000 ug | INTRAMUSCULAR | Status: DC | PRN

## 2024-05-20 MED ORDER — TRANEXAMIC ACID-NACL 1000-0.7 MG/100ML-% IV SOLN
INTRAVENOUS | Status: DC | PRN
  Administered 2024-05-20: 1000 mg via INTRAVENOUS

## 2024-05-20 MED ORDER — CEFAZOLIN SODIUM-DEXTROSE 2-4 GM/100ML-% IV SOLN
INTRAVENOUS | Status: AC
  Filled 2024-05-20: qty 100

## 2024-05-20 MED ORDER — EPHEDRINE 5 MG/ML INJ: 10.0000 mg | INTRAVENOUS | Status: DC | PRN

## 2024-05-20 MED ORDER — NALOXONE HCL 0.4 MG/ML IJ SOLN: 0.4000 mg | INTRAMUSCULAR | Status: DC | PRN

## 2024-05-20 MED ORDER — ACETAMINOPHEN 10 MG/ML IV SOLN
INTRAVENOUS | Status: DC | PRN
Start: 2024-05-20 — End: 2024-05-20
  Administered 2024-05-20: 1000 mg via INTRAVENOUS

## 2024-05-20 MED ORDER — FENTANYL CITRATE (PF) 100 MCG/2ML IJ SOLN
INTRAMUSCULAR | Status: DC | PRN
  Administered 2024-05-20: 100 ug via EPIDURAL

## 2024-05-20 SURGICAL SUPPLY — 28 items
BENZOIN TINCTURE PRP APPL 2/3 (GAUZE/BANDAGES/DRESSINGS) IMPLANT
CHLORAPREP W/TINT 26 (MISCELLANEOUS) ×2 IMPLANT
CLAMP UMBILICAL CORD (MISCELLANEOUS) ×1 IMPLANT
CLOTH BEACON ORANGE TIMEOUT ST (SAFETY) ×1 IMPLANT
DERMABOND ADVANCED .7 DNX12 (GAUZE/BANDAGES/DRESSINGS) ×1 IMPLANT
DRSG OPSITE POSTOP 4X10 (GAUZE/BANDAGES/DRESSINGS) ×1 IMPLANT
ELECTRODE REM PT RTRN 9FT ADLT (ELECTROSURGICAL) ×1 IMPLANT
EXTRACTOR VACUUM KIWI (MISCELLANEOUS) ×1 IMPLANT
GAUZE PAD ABD 8X10 STRL (GAUZE/BANDAGES/DRESSINGS) IMPLANT
GAUZE SPONGE 4X4 12PLY STRL (GAUZE/BANDAGES/DRESSINGS) IMPLANT
GLOVE BIOGEL PI IND STRL 7.0 (GLOVE) ×3 IMPLANT
GLOVE ECLIPSE 6.5 STRL STRAW (GLOVE) ×1 IMPLANT
GOWN STRL REUS W/ TWL LRG LVL3 (GOWN DISPOSABLE) ×2 IMPLANT
NS IRRIG 1000ML POUR BTL (IV SOLUTION) ×1 IMPLANT
PAD OB MATERNITY 4.3X12.25 (PERSONAL CARE ITEMS) ×1 IMPLANT
PAD PREP 24X48 CUFFED NSTRL (MISCELLANEOUS) ×1 IMPLANT
RETRACTOR WND ALEXIS 25 LRG (MISCELLANEOUS) IMPLANT
STRIP CLOSURE SKIN 1/2X4 (GAUZE/BANDAGES/DRESSINGS) IMPLANT
SUT MNCRL AB 0 CT1 27 (SUTURE) ×1 IMPLANT
SUT PLAIN 2 0 XLH (SUTURE) ×1 IMPLANT
SUT VIC AB 0 CT1 36 (SUTURE) ×1 IMPLANT
SUT VIC AB 0 CTX36XBRD ANBCTRL (SUTURE) ×1 IMPLANT
SUT VIC AB 2-0 CT1 TAPERPNT 27 (SUTURE) ×1 IMPLANT
SUT VIC AB 4-0 KS 27 (SUTURE) ×1 IMPLANT
TAPE PAPER 1X10 WHT MICROPORE (GAUZE/BANDAGES/DRESSINGS) IMPLANT
TOWEL OR 17X24 6PK STRL BLUE (TOWEL DISPOSABLE) ×3 IMPLANT
TRAY FOLEY W/BAG SLVR 16FR ST (SET/KITS/TRAYS/PACK) ×1 IMPLANT
WATER STERILE IRR 1000ML POUR (IV SOLUTION) ×1 IMPLANT

## 2024-05-20 NOTE — Progress Notes (Signed)
 LABOR PROGRESS NOTE  Patient Name: Lauren Ramirez, female   DOB: [birth year], 38 y.o.  MRN: [id number]  Patient starting to feel regular, painful contractions every 2-3 minutes. Balloon out. SROM 2352. Pit @1 . Cervix now 4.5/50/-3. Babe still extremely high. Discussed continuing to titrate pitocin  as needed to keep contractions regular. Cat I.  Maud Sorenson, MD

## 2024-05-20 NOTE — Progress Notes (Signed)
 Labor progress note:  Patient with recurrent deep late decel, continues to have fetal tachycardia. Given dose of Terbutaline for tocolysis. Plan for cesarean delivery at this time. Consent as documented previously. Plan for Ancef  and Azithromycin as well as TXA at delivery. Will continue on antibiotics for 24 hours postpartum given diagnosis of chorioamnionitis.   Melanie Spires, MD OB Fellow, Faculty Practice [facility], Center for [facility]

## 2024-05-20 NOTE — Progress Notes (Addendum)
 Labor progress note:  At bedside due to two minute long decel, down to 60s, returned up to baseline at 190s with moderate variability. Decel improved with positional changes. Overall cat II tracing based on fetal tachycardia, lates, mod variability. FHR baseline has been steadily rising, ROM approx 12 hours now. Discussed presumptive treatment for chorioamnionitis, will start Ampicillin/Gentamicin. Also discussed possbility of cesarean delivery. Discussed that if she has another prolonged deceleration, recommendation would be to proceed with cesarean delivery. Patient agreeable to this plan.  Lauren Ramirez has agreed to proceed with cesarean section due to Non-reassuring FHR. The risks of surgery were discussed with the patient including but were not limited to: bleeding which may require transfusion or reoperation; infection which may require antibiotics; injury to bowel, bladder, ureters or other surrounding organs; injury to the fetus; need for additional procedures including hysterectomy in the event of a life-threatening hemorrhage; formation of adhesions; placental abnormalities wth subsequent pregnancies; incisional problems; thromboembolic phenomenon and other postoperative/anesthesia complications. The patient concurred with the proposed plan, giving informed written consent for the procedure.   Mode of Delivery is Guarded and plan for RCS with next fetal deceleration  Melanie Spires, MD OB Fellow, Faculty Practice [place], Center for [facility]   Attestation of Attending Supervision of OB Fellow: Evaluation and management procedures were performed by the Family Medicine OB Fellow under my supervision.  I have reviewed the Fellow's note and chart, and I agree with the management and plan. I personally consented the patient.   Abner Ables, MD, MPH, ABFM Attending Physician Center for [facility] , [facility]

## 2024-05-20 NOTE — Discharge Summary (Signed)
 Postpartum Discharge Summary     Patient Name: Lauren Ramirez DOB: 06-07-1986 MRN: [id number]  Date of admission: 05/19/2024 Delivery date:05/20/2024 Delivering provider: ELDONNA SUZEN OCTAVE Date of discharge: 05/22/2024  Admitting diagnosis: Chronic hypertension affecting pregnancy [O10.919] Intrauterine pregnancy: [redacted]w[redacted]d     Secondary diagnosis:  Principal Problem:   S/P cesarean section Active Problems:   History of preterm delivery   History of cesarean section, low transverse   History of Crohn's disease   History of IUFD   AMA (advanced maternal age) multigravida 35+   Supervision of high risk pregnancy in third trimester   Chronic hypertension affecting pregnancy   BMI 36.0-36.9,adult  Additional problems: None    Discharge diagnosis: Term Pregnancy Delivered and Gestational Hypertension                                              Post partum procedures:None Augmentation: Pitocin  and IP Foley Complications: None  Hospital course: Induction of Labor With Cesarean Section   38 y.o. yo [phone] at [redacted]w[redacted]d was admitted to the hospital 05/19/2024 for induction of labor. Patient had a labor course significant for recurrent deep variables and several prolonged decels, which did respond to terbutaline . However, patient also with fetal tachycardia, for which she was empirically treated with Ampicillin  and Gentamicin . Given overall non-reassuring fetal status, proceeded with C/S. The patient went for cesarean section due to Non-Reassuring FHR. Delivery details are as follows: Membrane Rupture Time/Date: 2:11 PM,05/20/2024  Delivery Method:C-Section, Low Transverse Operative Delivery:N/A Details of operation can be found in separate operative Note.  Patient had a postpartum course complicated by nothing. Passing flatus and had BM. No issues with dizziness. Post operative drop in hemoglobin and was placed on oral iron . She is ambulating, tolerating a regular diet, passing  flatus, and urinating well.  Patient is discharged home in stable condition on 05/22/24.      Newborn Data: Birth date:05/20/2024 Birth time:2:12 PM Gender:Female Living status:Living Apgars:8 ,9  Weight:2850 g                               Magnesium  Sulfate received: No BMZ received: No Rhophylac:N/A MMR:N/A T-DaP:not given Flu: No RSV Vaccine received: No Transfusion:No  Immunizations received: Immunization History  Administered Date(s) Administered   Tdap 04/16/2012, 03/22/2014    Physical exam  Vitals:   05/21/24 0417 05/21/24 1300 05/21/24 2029 05/22/24 0531  BP: 109/75 110/76 125/80 123/82  Pulse: 75 71 86 78  Resp: 18 16 19 16   Temp: 98.3 F (36.8 C) 98.2 F (36.8 C) 98 F (36.7 C)   TempSrc:  Oral Oral   SpO2: 99% 99% 99% 99%  Weight:      Height:       General: alert, cooperative, and no distress Lochia: appropriate Uterine Fundus: firm Incision: Healing well with no significant drainage. Inact. Minimal blood on honeycomb DVT Evaluation: No evidence of DVT seen on physical exam. Labs: Lab Results  Component Value Date   WBC 18.7 (H) 05/21/2024   HGB 9.7 (L) 05/21/2024   HCT 29.4 (L) 05/21/2024   MCV 84.0 05/21/2024   PLT 237 05/21/2024      Latest Ref Rng & Units 05/20/2024    6:26 PM  CMP  Creatinine 0.44 - 1.00 mg/dL 9.21    Edinburgh Score:  05/21/2024   10:45 PM  Edinburgh Postnatal Depression Scale Screening Tool  I have been able to laugh and see the funny side of things. 0  I have looked forward with enjoyment to things. 0  I have blamed myself unnecessarily when things went wrong. 1  I have been anxious or worried for no good reason. 2  I have felt scared or panicky for no good reason. 1  Things have been getting on top of me. 1  I have been so unhappy that I have had difficulty sleeping. 1  I have felt sad or miserable. 1  I have been so unhappy that I have been crying. 0  The thought of harming myself has occurred to me. 0   Edinburgh Postnatal Depression Scale Total 7   Edinburgh Postnatal Depression Scale Total: 7   After visit meds:  Allergies as of 05/22/2024   No Known Allergies      Medication List     STOP taking these medications    aspirin  EC 81 MG tablet   NIFEdipine  30 MG 24 hr tablet Commonly known as: Procardia  XL   ondansetron  4 MG disintegrating tablet Commonly known as: ZOFRAN -ODT   progesterone  200 MG capsule Commonly known as: Prometrium    terconazole  0.4 % vaginal cream Commonly known as: TERAZOL 7        TAKE these medications    acetaminophen  500 MG tablet Commonly known as: TYLENOL  Take 500 mg by mouth every 6 (six) hours as needed for mild pain (pain score 1-3). What changed: Another medication with the same name was added. Make sure you understand how and when to take each.   acetaminophen  500 MG tablet Commonly known as: TYLENOL  Take 2 tablets (1,000 mg total) by mouth every 6 (six) hours. What changed: You were already taking a medication with the same name, and this prescription was added. Make sure you understand how and when to take each.   cholecalciferol 25 MCG (1000 UNIT) tablet Commonly known as: VITAMIN D3 Take 1,000 Units by mouth daily.   coconut oil Oil Apply 1 Application topically as needed.   cyanocobalamin 1000 MCG/ML injection Commonly known as: VITAMIN B12 Inject into the muscle.   dibucaine 1 % Oint Commonly known as: NUPERCAINAL Place 1 Application rectally as needed for hemorrhoids.   ferrous sulfate  325 (65 FE) MG tablet Take 1 tablet (325 mg total) by mouth every other day. Start taking on: May 23, 2024   fluticasone  50 MCG/ACT nasal spray Commonly known as: FLONASE  Place 1 spray into both nostrils daily.   furosemide  20 MG tablet Commonly known as: LASIX  Take 1 tablet (20 mg total) by mouth daily.   ibuprofen  600 MG tablet Commonly known as: ADVIL  Take 1 tablet (600 mg total) by mouth every 6 (six) hours.    multivitamin tablet Take 1 tablet by mouth daily.   oxyCODONE  5 MG immediate release tablet Commonly known as: Oxy IR/ROXICODONE  Take 1-2 tablets (5-10 mg total) by mouth every 6 (six) hours as needed (moderate pain not controlled by tylenol /ibuprofen ).   potassium chloride  SA 20 MEQ tablet Commonly known as: KLOR-CON  M Take 2 tablets (40 mEq total) by mouth daily.   senna-docusate 8.6-50 MG tablet Commonly known as: Senokot-S Take 2 tablets by mouth daily.   witch hazel-glycerin  pad Commonly known as: TUCKS Apply 1 Application topically as needed for hemorrhoids.         Discharge home in stable condition Infant Feeding: Bottle Infant Disposition:home with mother Discharge  instruction: per After Visit Summary and Postpartum booklet. Activity: Advance as tolerated. Pelvic rest for 6 weeks.  Diet: routine diet Future Appointments: Future Appointments  Date Time Provider Department Center  05/28/2024  9:50 AM Loreli Suzen BIRCH, CNM [facility]  07/01/2024 10:30 AM Kizzie Suzen SAUNDERS, CNM [facility]   Follow up Visit: Message sent to [facility] 6/19  Please schedule this patient for a In person postpartum visit in 4 weeks with the following provider: Any provider. Additional Postpartum F/U:Incision check 1 week and BP check 1 week  High risk pregnancy complicated by: HTN Delivery mode:  C-Section, Low Transverse Anticipated Birth Control:  Nexplanon vs interval BTS?   05/22/2024 Suzen Maryan Masters, MD

## 2024-05-20 NOTE — Anesthesia Procedure Notes (Signed)
 Epidural Patient location during procedure: OB Start time: 05/20/2024 8:32 AM End time: 05/20/2024 8:37 AM  Staffing Anesthesiologist: Vernadine Golas, MD Performed: anesthesiologist   Preanesthetic Checklist Completed: patient identified, IV checked, risks and benefits discussed, monitors and equipment checked, pre-op evaluation and timeout performed  Epidural Patient position: sitting Prep: DuraPrep and site prepped and draped Patient monitoring: heart rate, continuous pulse ox and blood pressure Approach: midline Location: L2-L3 Injection technique: LOR air  Needle:  Needle type: Tuohy  Needle gauge: 17 G Needle length: 9 cm Needle insertion depth: 7 cm Catheter type: closed end flexible Catheter size: 19 Gauge Catheter at skin depth: 12 cm  Assessment Events: blood not aspirated, injection not painful, no injection resistance, no paresthesia and negative IV test  Additional Notes Patient identified. Risks, benefits, and alternatives discussed with patient including but not limited to bleeding, infection, nerve damage, paralysis, failed block, incomplete pain control, headache, blood pressure changes, nausea, vomiting, reactions to medication, itching, and postpartum back pain. Confirmed with bedside nurse the patient's most recent platelet count. Confirmed with patient that they are not currently taking any anticoagulation, have any bleeding history, or any family history of bleeding disorders. Patient expressed understanding and wished to proceed. All questions were answered. Sterile technique was used throughout the entire procedure. Please see nursing notes for vital signs.   Difficult placement due to patient movement throughout procedure. 2 attempts required. Crisp LOR with Tuohy needle at L2-3 (first attempt at L3-4). Whitacre 25g spinal needle introduced through Tuohy with clear CSF return prior to injection of intrathecal medication. Spinal needle withdrawn and  epidural catheter threaded easily. Negative aspiration of catheter for heme or CSF prior to starting epidural infusion. Warning signs of high block given to the patient including shortness of breath, tingling/numbness in hands, complete motor block, or any concerning symptoms with instructions to call for help. Patient was given instructions on fall risk and not to get out of bed. All questions and concerns addressed with instructions to call with any issues or inadequate analgesia.  Patient comfortable with contractions prior to my leaving room. Reason for block:procedure for pain

## 2024-05-20 NOTE — Progress Notes (Signed)
 Labor Progress Note Lauren Ramirez is a 38 y.o. [phone] at [redacted]w[redacted]d   Entered room due to variable deceleration to the 70s on monitoring. Moved patient to left lateral position with improvement of HR to the 140s.  FSE placed by Dr. Candice Chalet.   Total time of decleration: 3.5 minutes with return to baseline  FSE confirmed FHR improvement to the 160s.   Noted to have tachycardia in the 180s. No maternal fever or maternal tachycardia.  Receiving LR bolus currently and is s/p epidural with low normal BPs but of note patient with cHTN and baseline BPs 130-140/8-90s. Blood pressures after epidural were 99-119/60-70s  Mode of Delivery: Guarded. Discussed with patient possible need for RCS if recurrent concern about FHR  Abner Ables, MD 11:49 AM

## 2024-05-20 NOTE — Progress Notes (Addendum)
 Called by RN while I was in another delivery regarding FHR tracing which shows a deep deceleration  Dilation: 8 Effacement (%): 70 Station: -3 Presentation: Vertex Exam by:: H.Price, RN  Verbal order for terbutaline given. FHR was already rebounding. Previously discussed CS with patient with prior episode of fetal heart rate deceleration.  Concerning features now including fetal tachycardia with less variability and recurrent deep declerations. Confirmed with patient and family plan for RCS.   NPO and to OR when ready  Abner Ables, MD, MPH, ABFM, [facility] Attending Physician Center for [facility]

## 2024-05-20 NOTE — Anesthesia Preprocedure Evaluation (Addendum)
 Anesthesia Evaluation  Patient identified by MRN, date of birth, ID band Patient awake    Reviewed: Allergy & Precautions, Patient's Chart, lab work & pertinent test results  History of Anesthesia Complications Negative for: history of anesthetic complications  Airway Mallampati: II  TM Distance: >3 FB Neck ROM: Full    Dental no notable dental hx.    Pulmonary former smoker   Pulmonary exam normal        Cardiovascular hypertension, Normal cardiovascular exam     Neuro/Psych negative neurological ROS     GI/Hepatic Neg liver ROS,,,Crohn's dx   Endo/Other  negative endocrine ROS    Renal/GU negative Renal ROS     Musculoskeletal negative musculoskeletal ROS (+)    Abdominal   Peds  Hematology negative hematology ROS (+)   Anesthesia Other Findings Day of surgery medications reviewed with patient.  Reproductive/Obstetrics (+) Pregnancy (Hx of C/Sx1)                             Anesthesia Physical Anesthesia Plan  ASA: 3  Anesthesia Plan: Combined Spinal and Epidural   Post-op Pain Management:    Induction:   PONV Risk Score and Plan: Treatment may vary due to age or medical condition  Airway Management Planned: Natural Airway  Additional Equipment: Fetal Monitoring  Intra-op Plan:   Post-operative Plan:   Informed Consent: I have reviewed the patients History and Physical, chart, labs and discussed the procedure including the risks, benefits and alternatives for the proposed anesthesia with the patient or authorized representative who has indicated his/her understanding and acceptance.       Plan Discussed with:   Anesthesia Plan Comments: (Plan for CSE due to patient report of inadequate analgesia with last epidural (states she thinks she got it too late) and advanced dilation in this pregnancy. Jarrell Merritts, MD)        Anesthesia Quick Evaluation

## 2024-05-20 NOTE — Progress Notes (Signed)
 Pharmacy Antibiotic Note  Lauren Ramirez is a 38 y.o. female admitted on 05/19/2024 with chorioamnionitis. Pharmacy has been consulted for gentamicin dosing.  WBC 9.2 > 14.5, patient is afebrile, and CrCl 121.6 mL/min.   Plan: Begin gentamicin 5 mg/kg (400 mg) IV q24h (using ABW) Continue ampicillin IV 2g q6h  Monitor signs for clinical improvement   Height: 5' 6 (167.6 cm) Weight: 109 kg (240 lb 3.2 oz) IBW/kg (Calculated) : 59.3  Temp (24hrs), Avg:98.1 F (36.7 C), Min:97.7 F (36.5 C), Max:98.5 F (36.9 C)  Recent Labs  Lab 05/19/24 2200 05/20/24 0715  WBC 9.2 14.5*  CREATININE 0.65  --     Estimated Creatinine Clearance: 121.6 mL/min (by C-G formula based on SCr of 0.65 mg/dL).    No Known Allergies  Antimicrobials this admission: Ampicillin 6/19 >> Gentamicin 6/19 >>   Thank you for allowing pharmacy to be a part of this patient's care.  Adaline Ada, PharmD PGY1 Pharmacy Resident 05/20/2024 12:22 PM

## 2024-05-20 NOTE — Progress Notes (Signed)
 LABOR PROGRESS NOTE  Patient Name: Lauren Ramirez, female   DOB: 1986-08-14, 38 y.o.  MRN: [id number]  Patient feeling very painful contractions. Tearful. Cervix 6/70/-3. Babe still very high. Requesting epidural. Continue to titrate pit as needed.  Maud Sorenson, MD

## 2024-05-20 NOTE — Anesthesia Postprocedure Evaluation (Signed)
 Anesthesia Post Note  Patient: Lauren Ramirez  Procedure(s) Performed: CESAREAN DELIVERY (Abdomen)     Patient location during evaluation: PACU Anesthesia Type: Epidural Level of consciousness: awake and alert Pain management: pain level controlled Vital Signs Assessment: post-procedure vital signs reviewed and stable Respiratory status: spontaneous breathing, nonlabored ventilation and respiratory function stable Cardiovascular status: blood pressure returned to baseline Postop Assessment: epidural receding, no apparent nausea or vomiting, no headache and no backache Anesthetic complications: no   No notable events documented.  Last Vitals:  Vitals:   05/20/24 1615 05/20/24 1618  BP: 119/82   Pulse: 84 84  Resp: 18 (!) 21  Temp:    SpO2:      Last Pain:  Vitals:   05/20/24 1618  TempSrc:   PainSc: 0-No pain   Pain Goal:                   Rayfield Cairo

## 2024-05-20 NOTE — Transfer of Care (Signed)
 Immediate Anesthesia Transfer of Care Note  Patient: Lauren Ramirez  Procedure(s) Performed: CESAREAN DELIVERY (Abdomen)  Patient Location: PACU  Anesthesia Type:Epidural  Level of Consciousness: awake, alert , and oriented  Airway & Oxygen Therapy: Patient Spontanous Breathing  Post-op Assessment: Report given to RN and Post -op Vital signs reviewed and stable  Post vital signs: Reviewed and stable  Last Vitals:  Vitals Value Taken Time  BP 112/77 05/20/24 15:15  Temp    Pulse 93 05/20/24 15:17  Resp 19 05/20/24 15:17  SpO2 100 % 05/20/24 15:17  Vitals shown include unfiled device data.  Last Pain:  Vitals:   05/20/24 1132  TempSrc: Oral  PainSc:          Complications: No notable events documented.

## 2024-05-20 NOTE — Op Note (Signed)
 Lauren Ramirez PROCEDURE DATE: 05/20/24   PREOPERATIVE DIAGNOSES: Intrauterine pregnancy at [redacted]w[redacted]d weeks gestation; non-reassuring fetal status  POSTOPERATIVE DIAGNOSES: The same, viable infant delivered  PROCEDURE: Repeat Low Transverse Cesarean Section  SURGEON:  Dr. Charleston Conrad  ASSISTANT:  Melanie Spires, MD, Candice Chalet, MD An experienced assistant was required given the standard of surgical care given the complexity of the case.  This assistant was needed for exposure, dissection, suctioning, retraction, instrument exchange, assisting with delivery with administration of fundal pressure, and for overall help during the procedure.  ANESTHESIOLOGY TEAM: Anesthesiologist: Vernadine Golas, MD CRNA: Doak Free, CRNA  INDICATIONS: Lauren Ramirez is a 38 y.o. [id number] at [redacted]w[redacted]d here for cesarean section secondary to the indications listed under preoperative diagnoses; please see preoperative note for further details.  The risks of surgery were discussed with the patient including but were not limited to: bleeding which may require transfusion or reoperation; infection which may require antibiotics; injury to bowel, bladder, ureters or other surrounding organs; injury to the fetus; need for additional procedures including hysterectomy in the event of a life-threatening hemorrhage; formation of adhesions; placental abnormalities wth subsequent pregnancies; incisional problems; thromboembolic phenomenon and other postoperative/anesthesia complications.  The patient concurred with the proposed plan, giving informed written consent for the procedure.    FINDINGS:  Viable female infant in cephalic presentation.  Apgars 8 and 9.  Amniotic fluid: clear.  Intact placenta, three vessel cord.  Normal uterus, fallopian tubes and ovaries bilaterally.  ANESTHESIA: epidural INTRAVENOUS FLUIDS: 1200 ml   ESTIMATED BLOOD LOSS: 264 ml URINE OUTPUT:  100 ml SPECIMENS: Placenta sent to  pathology and arterial cord gas collected COMPLICATIONS: None immediate  PROCEDURE IN DETAIL:  The patient preoperatively received intravenous antibiotics and had sequential compression devices applied to her lower extremities.  She was then taken to the operating room where the epidural was dosed up to a surgical level and found to be adequate. She was then placed in a dorsal supine position with a leftward tilt, and prepped and draped in a sterile manner.  A foley catheter was  was already in place.  After an adequate timeout was performed, a Pfannenstiel skin incision was made with scalpel and carried through to the underlying layer of fascia. The fascia was incised in the midline, and this incision was extended bluntly. The rectus muscles were separated in the midline and the peritoneum was entered bluntly. The Alexis self-retaining retractor was introduced into the abdominal cavity. The vesicouterine peritoneum was identified and grasped using smooth pickups.  It was incised and extended laterally using the Metzenbaum scissors.  A bladder flap was then digitally created.   Attention was turned to the lower uterine segment where a low transverse hysterotomy was made with a scalpel and extended bluntly in caudad and cephalad directions.  The infant was successfully delivered, the cord was clamped and cut after approximately 30 seconds given poor tone, and the infant was handed over to the awaiting neonatology team. An arterial blood gas was collected.  Uterine massage was then administered, and the placenta delivered intact with a three-vessel cord. The hysterotomy was closed with 0-Monocryl in a running fashion. Figure-of-eight 0 Monocryl serosal stitches were placed to help with hemostasis.   The pelvis was cleared of all clot and debris. Hemostasis was confirmed on all surfaces. The abdomen was irrigated then hemostasis was again confirmed on all sides. The uterus was once again inspected and found to  be hemostatic. The retractor was removed.  The peritoneum was closed with a 2-0 Vicryl running stitch. The fascia was then closed using 0 Vicryl in a running fashion.  The subcutaneous layer was irrigated, any areas of bleeding were cauterized with the bovie,  was reapproximated with 2-0 plain gut in a running fashion, was found to be hemostatic. The skin was closed with a 4-0 Vicryl subcuticular stitch. The patient tolerated the procedure well. Sponge, instrument and needle counts were correct x 3.  She was taken to the recovery room in stable condition.   Melanie Spires, MD FMOB Fellow, Faculty practice [facility], Center for [facility] 05/20/24  3:10 PM

## 2024-05-20 NOTE — Progress Notes (Signed)
 LABOR PROGRESS NOTE  Patient Name: Lauren Ramirez, female   DOB: 1986-01-01, 38 y.o.  MRN: [id number]  Pt comfortable from epidural however having recurrent lates not responsive to decrease in pitocin , position changes or initiation of pIV bolus.  Asked to stop pitocin  entirely.  Pt CHTN and BP 103/63 so asked to give PRN phenylephrine.  Improvement in decels and BP, nursing to continue to monitor closely, continue with pIV bolus.  Will restart pitocin  after of reassuring FHT.  If unchanged SVE in 2hrs will place IUPC.  Pt apart of decision making process and comfortable with plan ahead.    Ebony Goldstein, MD

## 2024-05-21 ENCOUNTER — Encounter (HOSPITAL_COMMUNITY): Payer: Self-pay | Admitting: Family Medicine

## 2024-05-21 ENCOUNTER — Other Ambulatory Visit: Payer: Self-pay

## 2024-05-21 LAB — CBC
HCT: 29.4 % — ABNORMAL LOW (ref 36.0–46.0)
Hemoglobin: 9.7 g/dL — ABNORMAL LOW (ref 12.0–15.0)
MCH: 27.7 pg (ref 26.0–34.0)
MCHC: 33 g/dL (ref 30.0–36.0)
MCV: 84 fL (ref 80.0–100.0)
Platelets: 237 10*3/uL (ref 150–400)
RBC: 3.5 MIL/uL — ABNORMAL LOW (ref 3.87–5.11)
RDW: 14 % (ref 11.5–15.5)
WBC: 18.7 10*3/uL — ABNORMAL HIGH (ref 4.0–10.5)
nRBC: 0 % (ref 0.0–0.2)

## 2024-05-21 NOTE — Progress Notes (Signed)
 Post-Op Day 1, repeat CS for NRFHT  Subjective: No complaints, up ad lib, voiding and tolerating PO, passing flatus,small lochia,.plans to bottle feed, nexplanon, unsure if inpt or outpt  Objective: Blood pressure 109/75, pulse 75, temperature 98.3 F (36.8 C), resp. rate 18, height 5' 6 (1.676 m), weight 109 kg, last menstrual period 08/27/2023, SpO2 99%, unknown if currently breastfeeding.  Physical Exam:  General: alert, cooperative and no distress Lochia:normal flow Chest: CTAB Heart: RRR no m/r/g Abdomen: +BS, soft, nontender, dsg c/d/intact, no erythema Uterine Fundus: firm DVT Evaluation: No evidence of DVT seen on physical exam. Extremities: trace edema  Recent Labs    05/20/24 0715 05/21/24 0531  HGB 11.6* 9.7*  HCT 34.6* 29.4*    Assessment/Plan: POD#1, stable Start oral iron  Continue BP meds   LOS: 2 days   Lauren Ramirez 05/21/2024, 8:20 AM

## 2024-05-21 NOTE — Social Work (Signed)
 CSW received consult for MH check in.  CSW met with [name] to offer support and complete assessment. CSW entered the room and observed [name] on the bed holding the infant. CSW introduced self, CSW role and reason for visit. [name] was agreeable to visit. CSW inquire d about how [name] was feeling, [name] reported good. CSW inquired about [name] mood throughout her pregnancy, [name] reported a stable mood. CSW inquired about MH concerns, [name] denied any MH concerns. CSW provided education regarding the baby blues period vs. perinatal mood disorders, discussed treatment and gave resources for mental health follow up if concerns arise.  CSW recommends self-evaluation during the postpartum time period using the New Mom Checklist from Postpartum Progress and encouraged [name] to contact a medical professional if symptoms are noted at any time.  [name] identified FOB her kids and mom as her primary supports.   CSW provided review of Sudden Infant Death Syndrome (SIDS) precautions.  [name] identified [facility] for infant follow up care. [name] reported she has all necessary items for the infant including a bassinet and car seat. CSW identifies no further need for intervention and no barriers to discharge at this time.  Lauren Ramirez, LCSWA Clinical Social Worker [phone]

## 2024-05-22 ENCOUNTER — Other Ambulatory Visit (HOSPITAL_COMMUNITY): Payer: Self-pay

## 2024-05-22 MED ORDER — ACETAMINOPHEN 500 MG PO TABS: 1000.0000 mg | ORAL_TABLET | Freq: Four times a day (QID) | ORAL | Status: AC

## 2024-05-22 MED ORDER — DIBUCAINE (PERIANAL) 1 % EX OINT: 1.0000 | TOPICAL_OINTMENT | CUTANEOUS | Status: AC | PRN

## 2024-05-22 MED ORDER — IBUPROFEN 600 MG PO TABS
600.0000 mg | ORAL_TABLET | Freq: Four times a day (QID) | ORAL | 1 refills | Status: AC
  Filled 2024-05-22: qty 90, 23d supply, fill #0

## 2024-05-22 MED ORDER — FERROUS SULFATE 325 (65 FE) MG PO TABS
325.0000 mg | ORAL_TABLET | ORAL | 2 refills | Status: AC
  Filled 2024-05-22: qty 30, 60d supply, fill #0

## 2024-05-22 MED ORDER — SENNOSIDES-DOCUSATE SODIUM 8.6-50 MG PO TABS
2.0000 | ORAL_TABLET | Freq: Every day | ORAL | 0 refills | Status: AC
Start: 2024-05-22 — End: ?
  Filled 2024-05-22: qty 60, 30d supply, fill #0

## 2024-05-22 MED ORDER — WITCH HAZEL-GLYCERIN EX PADS
1.0000 | MEDICATED_PAD | CUTANEOUS | 12 refills | Status: AC | PRN
  Filled 2024-05-22: qty 40, 20d supply, fill #0

## 2024-05-22 MED ORDER — FUROSEMIDE 20 MG PO TABS
20.0000 mg | ORAL_TABLET | Freq: Every day | ORAL | 0 refills | Status: AC
Start: 2024-05-22 — End: ?
  Filled 2024-05-22: qty 5, 5d supply, fill #0

## 2024-05-22 MED ORDER — FERROUS GLUCONATE 324 (38 FE) MG PO TABS: 324.0000 mg | ORAL_TABLET | ORAL | Status: DC

## 2024-05-22 MED ORDER — OXYCODONE HCL 5 MG PO TABS
5.0000 mg | ORAL_TABLET | Freq: Four times a day (QID) | ORAL | 0 refills | Status: AC | PRN
  Filled 2024-05-22: qty 20, 3d supply, fill #0

## 2024-05-22 MED ORDER — POTASSIUM CHLORIDE CRYS ER 20 MEQ PO TBCR
40.0000 meq | EXTENDED_RELEASE_TABLET | Freq: Every day | ORAL | 0 refills | Status: AC
  Filled 2024-05-22: qty 10, 5d supply, fill #0

## 2024-05-22 MED ORDER — COCONUT OIL OIL: 1.0000 | TOPICAL_OIL | Status: AC | PRN

## 2024-05-24 ENCOUNTER — Other Ambulatory Visit

## 2024-05-24 ENCOUNTER — Encounter: Admitting: Obstetrics & Gynecology

## 2024-05-24 LAB — SURGICAL PATHOLOGY

## 2024-05-27 ENCOUNTER — Other Ambulatory Visit

## 2024-05-28 ENCOUNTER — Encounter: Admitting: Advanced Practice Midwife

## 2024-05-29 ENCOUNTER — Telehealth (HOSPITAL_COMMUNITY): Payer: Self-pay

## 2024-05-29 NOTE — Telephone Encounter (Signed)
 05/29/2024 1805  Name: Lauren Ramirez MRN: [id number] DOB: 1986-04-18  Reason for Call:  Transition of Care Hospital Discharge Call  Contact Status: Patient Contact Status: Complete  Language assistant needed:          Follow-Up Questions: Do You Have Any Concerns About Your Health As You Heal From Delivery?: No Do You Have Any Concerns About Your Infants Health?: No  Edinburgh Postnatal Depression Scale:  In the Past 7 Days:    PHQ2-9 Depression Scale:     Discharge Follow-up: Edinburgh score requires follow up?:  (Patient states that she is in the store shopping and would like a call back to complete screening.) Patient was advised of the following resources:: Breastfeeding Support Group, Support Group  Post-discharge interventions: Reviewed Newborn Safe Sleep Practices  Signature  Rosaline Deretha PEAK

## 2024-05-31 ENCOUNTER — Other Ambulatory Visit

## 2024-05-31 ENCOUNTER — Encounter: Payer: Self-pay | Admitting: Women's Health

## 2024-05-31 ENCOUNTER — Encounter: Admitting: Obstetrics & Gynecology

## 2024-05-31 ENCOUNTER — Ambulatory Visit: Admitting: Women's Health

## 2024-05-31 VITALS — BP 178/118 | HR 73 | Ht 66.0 in | Wt 231.0 lb

## 2024-05-31 DIAGNOSIS — I10 Essential (primary) hypertension: Secondary | ICD-10-CM

## 2024-05-31 DIAGNOSIS — Z4889 Encounter for other specified surgical aftercare: Secondary | ICD-10-CM

## 2024-05-31 MED ORDER — NIFEDIPINE ER OSMOTIC RELEASE 60 MG PO TB24: 60.0000 mg | ORAL_TABLET | Freq: Every day | ORAL | 2 refills | Status: AC

## 2024-05-31 NOTE — Progress Notes (Signed)
 GYN VISIT Patient name: Lauren Ramirez MRN [id number]  Date of birth: 24-Feb-1986 Chief Complaint:   Post-op Follow-up  History of Present Illness:   Lauren Ramirez is a 38 y.o. [id number] African-American female 11d s/p RCS being seen today for incision and bp check.  D/c'd on lasix /K+ x 5d-didn't take, no bp meds. CHTN dx @ 16wks this pregnancy, no meds prior to pregnancy. Denies ha, visual changes, ruq/epigastric pain, n/v.  Bottlefeeding. Plans Nexplanon at ppv. No dep/anx.  Patient's last menstrual period was 08/27/2023.     04/08/2024    9:43 AM 11/19/2023    3:29 PM 10/09/2023    2:11 PM 07/23/2018   11:56 AM 02/20/2017    9:38 AM  Depression screen PHQ 2/9  Decreased Interest 0 1 0 2 0  Down, Depressed, Hopeless 0 1 0 2 0  PHQ - 2 Score 0 2 0 4 0  Altered sleeping 0 0 0 0   Tired, decreased energy 1 1 1 3    Change in appetite 0 0 0 3   Feeling bad or failure about yourself  0 0 0 0   Trouble concentrating 0 1 0 0   Moving slowly or fidgety/restless 0 0 0 0   Suicidal thoughts 0 0 0 0   PHQ-9 Score 1 4 1 10          11/19/2023    3:29 PM 10/09/2023    2:11 PM  GAD 7 : Generalized Anxiety Score  Nervous, Anxious, on Edge 0 0  Control/stop worrying 0 0  Worry too much - different things 1 0  Trouble relaxing 0 0  Restless 0 0  Easily annoyed or irritable 1 1  Afraid - awful might happen 0 0  Total GAD 7 Score 2 1     Review of Systems:   Pertinent items are noted in HPI Denies fever/chills, dizziness, headaches, visual disturbances, fatigue, shortness of breath, chest pain, abdominal pain, vomiting, abnormal vaginal discharge/itching/odor/irritation, problems with periods, bowel movements, urination, or intercourse unless otherwise stated above.  Pertinent History Reviewed:  Reviewed past medical,surgical, social, obstetrical and family history.  Reviewed problem list, medications and allergies. Physical Assessment:   Vitals:   05/31/24 1115 05/31/24  1146  BP: (!) 165/111 (!) 178/118  Pulse:  73  Weight: 231 lb (104.8 kg)   Height: 5' 6 (1.676 m)   Body mass index is 37.28 kg/m.       Physical Examination:   General appearance: alert, well appearing, and in no distress  Mental status: alert, oriented to person, place, and time  Skin: warm & dry   Cardiovascular: normal heart rate noted  Respiratory: normal respiratory effort, no distress  Abdomen: soft, non-tender, c/s incision healing well, no s/s infection  Pelvic: examination not indicated  Extremities: no edema   Chaperone: N/A  No results found for this or any previous visit (from the past 24 hours).  Assessment & Plan:  1) 11d s/p RCS> bottlefeeding  2) CHTN uncontrolled> discussed severe range bp's w/ Dr. Jayne, recommended nifedipine  60mg  daily, no need for readmit at this point. F/U 2d for bp check w/ nurse, take nifedipine  at least 2hr before appt  3) Contraception counseling> plans nexplanon at ppv, abstinence until then  Meds:  Meds ordered this encounter  Medications   NIFEdipine  (PROCARDIA  XL/NIFEDICAL XL) 60 MG 24 hr tablet    Sig: Take 1 tablet (60 mg total) by mouth daily.    Dispense:  30 tablet  Refill:  2    No orders of the defined types were placed in this encounter.   Return for 2d bp check w/ nurse in person.  Suzen JONELLE Fetters CNM, [facility] 05/31/2024 11:53 AM

## 2024-06-02 ENCOUNTER — Ambulatory Visit

## 2024-07-01 ENCOUNTER — Encounter: Payer: Self-pay | Admitting: Women's Health

## 2024-07-01 ENCOUNTER — Ambulatory Visit: Admitting: Women's Health

## 2024-07-01 DIAGNOSIS — I10 Essential (primary) hypertension: Secondary | ICD-10-CM | POA: Diagnosis not present

## 2024-07-01 DIAGNOSIS — Z30013 Encounter for initial prescription of injectable contraceptive: Secondary | ICD-10-CM | POA: Diagnosis not present

## 2024-07-01 DIAGNOSIS — F418 Other specified anxiety disorders: Secondary | ICD-10-CM | POA: Diagnosis not present

## 2024-07-01 DIAGNOSIS — Z3202 Encounter for pregnancy test, result negative: Secondary | ICD-10-CM | POA: Diagnosis not present

## 2024-07-01 DIAGNOSIS — Z98891 History of uterine scar from previous surgery: Secondary | ICD-10-CM

## 2024-07-01 LAB — POCT URINE PREGNANCY: Preg Test, Ur: NEGATIVE

## 2024-07-01 MED ORDER — MEDROXYPROGESTERONE ACETATE 150 MG/ML IM SUSP: 150.0000 mg | INTRAMUSCULAR | 3 refills | Status: AC

## 2024-07-01 MED ORDER — MEDROXYPROGESTERONE ACETATE 150 MG/ML IM SUSY
150.0000 mg | PREFILLED_SYRINGE | Freq: Once | INTRAMUSCULAR | Status: AC
  Administered 2024-07-01: 150 mg via INTRAMUSCULAR

## 2024-07-01 NOTE — Progress Notes (Signed)
 POSTPARTUM VISIT Patient name: Lauren Ramirez MRN [id number]  Date of birth: 03/16/1986 Chief Complaint:   Postpartum Care  History of Present Illness:   Asta Corbridge is a 38 y.o. [phone] African American female being seen today for a postpartum visit. She is 6 weeks postpartum following a repeat cesarean section, low transverse incision at 38.1 gestational weeks. IOL: yes, for chronic hypertension . Anesthesia: epidural.  Laceration: n/a.  Complications: none. Inpatient contraception: no.   Pregnancy complicated by CHTN-no meds prior to pregnancy. Tobacco use: no. Substance use disorder: no. Last pap smear: 12/18/23 and results were ASCUS w/ HRHPV positive: other (not 16, 18/45). Needs colpo Patient's last menstrual period was 08/27/2023.  Postpartum course has been complicated by HTN, noncompliant w/ taking meds. Bleeding none, thinks she had period last week. Bowel function is normal. Bladder function is normal. Urinary incontinence? no, fecal incontinence? no Patient is not sexually active. Last sexual activity: prior to birth of baby. Desired contraception: depo. Patient does not want a pregnancy in the future.  Desired family size is 5 children.   Upstream - 07/01/24 1042       Pregnancy Intention Screening   Does the patient want to become pregnant in the next year? No    Does the patient's partner want to become pregnant in the next year? No    Would the patient like to discuss contraceptive options today? Yes      Contraception Wrap Up   Current Method Abstinence    End Method Hormonal Implant    Contraception Counseling Provided Yes         The pregnancy intention screening data noted above was reviewed. Potential methods of contraception were discussed. The patient elected to proceed with Hormonal Implant.  Edinburgh Postpartum Depression Screening: negative, feels she has some anxiety. Declines meds, interested in [facility]  Edinburgh Postnatal Depression Scale  - 07/01/24 1041       Edinburgh Postnatal Depression Scale:  In the Past 7 Days   I have been able to laugh and see the funny side of things. 0    I have looked forward with enjoyment to things. 0    I have blamed myself unnecessarily when things went wrong. 0    I have been anxious or worried for no good reason. 2    I have felt scared or panicky for no good reason. 2    Things have been getting on top of me. 1    I have been so unhappy that I have had difficulty sleeping. 1    I have felt sad or miserable. 1    I have been so unhappy that I have been crying. 1    The thought of harming myself has occurred to me. 0    Edinburgh Postnatal Depression Scale Total 8             11/19/2023    3:29 PM 10/09/2023    2:11 PM  GAD 7 : Generalized Anxiety Score  Nervous, Anxious, on Edge 0 0  Control/stop worrying 0 0  Worry too much - different things 1 0  Trouble relaxing 0 0  Restless 0 0  Easily annoyed or irritable 1 1  Afraid - awful might happen 0 0  Total GAD 7 Score 2 1     Baby's course has been uncomplicated. Baby is feeding by bottle. Infant has a pediatrician/family doctor? Yes.  Childcare strategy if returning to work/school: family.  Pt has material  needs met for her and baby: Yes.   Review of Systems:   Pertinent items are noted in HPI Denies Abnormal vaginal discharge w/ itching/odor/irritation, headaches, visual changes, shortness of breath, chest pain, abdominal pain, severe nausea/vomiting, or problems with urination or bowel movements. Pertinent History Reviewed:  Reviewed past medical,surgical, obstetrical and family history.  Reviewed problem list, medications and allergies. OB History  Gravida Para Term Preterm AB Living  6 5 1 4 1 5   SAB IAB Ectopic Multiple Live Births  0 1 0 1 5    # Outcome Date GA Lbr Len/2nd Weight Sex Type Anes PTL Lv  6 Term 05/20/24 [redacted]w[redacted]d  6 lb 4.5 oz (2.85 kg) F CS-LTranv EPI  LIV  5 Preterm 09/07/18 [redacted]w[redacted]d 15:11 15.3 oz (0.434  kg) M Vag-Breech None  FD  4 IAB 2018          3A Preterm 04/14/12 [redacted]w[redacted]d  5 lb 0.6 oz (2.285 kg) M CS-LTranv Spinal  LIV     Birth Comments: preterm appearance c/w 35 wks  3B Preterm 04/14/12 [redacted]w[redacted]d  5 lb 13.3 oz (2.645 kg) M CS-LTranv Spinal Y LIV     Birth Comments: preterm c/w 35 wks     Complications: Preterm premature rupture of membranes (PPROM) delivered, current hospitalization  2 Preterm 05/07/10 [redacted]w[redacted]d  6 lb 4 oz (2.835 kg) F Vag-Spont EPI Y LIV     Birth Comments: 36wks. 17p injections  1 Preterm 04/27/07 [redacted]w[redacted]d  3 lb 2 oz (1.417 kg) F Vag-Spont None Y LIV     Birth Comments: 32 wks   Physical Assessment:   Vitals:   07/01/24 1041 07/01/24 1100  BP: (!) 159/109 (!) 154/109  Pulse: 88   Weight: 226 lb (102.5 kg)   Height: 5' 5 (1.651 m)   Body mass index is 37.61 kg/m.       Physical Examination:   General appearance: alert, well appearing, and in no distress  Mental status: alert, oriented to person, place, and time  Skin: warm & dry   Cardiovascular: normal heart rate noted   Respiratory: normal respiratory effort, no distress   Breasts: deferred, no complaints   Abdomen: soft, non-tender, c/s incision well healed  Pelvic: examination not indicated. Thin prep pap obtained: No  Rectal: not examined  Extremities: Edema: none   Chaperone: N/A       Results for orders placed or performed in visit on 07/01/24 (from the past 24 hours)  POCT urine pregnancy   Collection Time: 07/01/24 10:51 AM  Result Value Ref Range   Preg Test, Ur Negative Negative    Assessment & Plan:  1) Postpartum exam 2) 6 wks s/p repeat cesarean section, low transverse incision 3) bottle feeding 4) Depression screening 5) Contraception management: depo today, condoms x 2wks, rx sent, f/u 11-13wks next dose 6) PP anxiety> declines meds, [facility] referral  7) Uncontrolled CHTN> noncompliant w/ meds, discussed importance of bp control, r/f stroke/MI. Promises to start nifedipine  30mg  she already  has (was supposed to increase to 60mg , but was never taking the 30mg ). Take 2hr before next appt. PCP list given 8) Abnormal pap> colpo in 2wks  Essential components of care per ACOG recommendations:  1.  Mood and well being:  If positive depression screen, discussed and plan developed.  If using tobacco we discussed reduction/cessation and risk of relapse If current substance abuse, we discussed and referral to local resources was offered.   2. Infant care and feeding:  If breastfeeding,  discussed returning to work, pumping, breastfeeding-associated pain, guidance regarding return to fertility while lactating if not using another method. If needed, patient was provided with a letter to be allowed to pump q 2-3hrs to support lactation in a private location with access to a refrigerator to store breastmilk.   Recommended that all caregivers be immunized for flu, pertussis and other preventable communicable diseases If pt does not have material needs met for her/baby, referred to local resources for help obtaining these.  3. Sexuality, contraception and birth spacing Provided guidance regarding sexuality, management of dyspareunia, and resumption of intercourse Discussed avoiding interpregnancy interval <49mths and recommended birth spacing of 18 months  4. Sleep and fatigue Discussed coping options for fatigue and sleep disruption Encouraged family/partner/community support of 4 hrs of uninterrupted sleep to help with mood and fatigue  5. Physical recovery  If pt had a C/S, assessed incisional pain and providing guidance on normal vs prolonged recovery If pt had a laceration, perineal healing and pain reviewed.  If urinary or fecal incontinence, discussed management and referred to PT or uro/gyn if indicated  Patient is safe to resume physical activity. Discussed attainment of healthy weight.  6.  Chronic disease management Discussed pregnancy complications if any, and their implications  for future childbearing and long-term maternal health. Review recommendations for prevention of recurrent pregnancy complications, such as 17 hydroxyprogesterone caproate  to reduce risk for recurrent PTB did not discuss, or aspirin  to reduce risk of preeclampsia did not discuss. Pt had GDM: no. If yes, 2hr GTT scheduled: not applicable. Reviewed medications and non-pregnant dosing including consideration of whether pt is breastfeeding using a reliable resource such as LactMed: not applicable Referred for f/u w/ PCP or subspecialist providers as indicated: yes  7. Health maintenance Mammogram at 36yo or earlier if indicated Pap smears as indicated  Meds:  Meds ordered this encounter  Medications   medroxyPROGESTERone  (DEPO-PROVERA ) 150 MG/ML injection    Sig: Inject 1 mL (150 mg total) into the muscle every 3 (three) months.    Dispense:  1 mL    Refill:  3   medroxyPROGESTERone  Acetate SUSY 150 mg    Follow-up: Return in about 2 weeks (around 07/15/2024) for colpo; then 11-13wks from today for next depo.   Orders Placed This Encounter  Procedures   [facility] ref to [facility]   POCT urine pregnancy    Suzen JONELLE Fetters CNM, [facility] 07/01/2024 11:12 AM

## 2024-07-01 NOTE — Patient Instructions (Addendum)
 Primary Care Providers Dr. Dwana Melena [place]) [phone] [facility] [phone] [facility] [phone] [facility] [place]) [phone] [facility] [place]) [phone] [facility] [place]) [phone] [facility] [phone] [facility] [phone]

## 2024-07-06 ENCOUNTER — Encounter: Payer: Self-pay | Admitting: Licensed Clinical Social Worker

## 2024-07-15 ENCOUNTER — Encounter: Admitting: Women's Health

## 2024-07-15 NOTE — BH Specialist Note (Unsigned)
 Patient no showed for today's visit.

## 2024-08-31 ENCOUNTER — Other Ambulatory Visit: Payer: Self-pay | Admitting: Adult Health

## 2024-09-20 ENCOUNTER — Ambulatory Visit

## 2024-09-29 ENCOUNTER — Ambulatory Visit

## 2024-10-01 ENCOUNTER — Ambulatory Visit

## 2024-11-05 DIAGNOSIS — K029 Dental caries, unspecified: Secondary | ICD-10-CM | POA: Diagnosis not present
